# Patient Record
Sex: Female | Born: 1965 | Race: Black or African American | Hispanic: No | Smoking: Never smoker
Health system: Southern US, Community
[De-identification: ages and names within clinical notes are randomized; demographics above are authoritative.]

## PROBLEM LIST (undated history)

## (undated) DIAGNOSIS — I10 Essential (primary) hypertension: Secondary | ICD-10-CM

## (undated) DIAGNOSIS — G43909 Migraine, unspecified, not intractable, without status migrainosus: Secondary | ICD-10-CM

## (undated) DIAGNOSIS — R7303 Prediabetes: Secondary | ICD-10-CM

## (undated) HISTORY — PX: CHOLECYSTECTOMY: SHX55

## (undated) HISTORY — PX: CYST EXCISION: SHX5701

## (undated) HISTORY — PX: ABDOMINAL HYSTERECTOMY: SHX81

## (undated) HISTORY — PX: APPENDECTOMY: SHX54

---

## 1998-06-23 ENCOUNTER — Encounter: Payer: Self-pay | Admitting: Emergency Medicine

## 1998-06-23 ENCOUNTER — Emergency Department (HOSPITAL_COMMUNITY): Admission: EM | Admit: 1998-06-23 | Discharge: 1998-06-23 | Payer: Self-pay | Admitting: Emergency Medicine

## 1999-01-22 ENCOUNTER — Inpatient Hospital Stay (HOSPITAL_COMMUNITY): Admission: AD | Admit: 1999-01-22 | Discharge: 1999-01-22 | Payer: Self-pay | Admitting: Obstetrics & Gynecology

## 1999-04-15 ENCOUNTER — Encounter: Payer: Self-pay | Admitting: Obstetrics and Gynecology

## 1999-04-15 ENCOUNTER — Other Ambulatory Visit: Admission: RE | Admit: 1999-04-15 | Discharge: 1999-04-15 | Payer: Self-pay | Admitting: Obstetrics and Gynecology

## 1999-04-15 ENCOUNTER — Ambulatory Visit (HOSPITAL_COMMUNITY): Admission: RE | Admit: 1999-04-15 | Discharge: 1999-04-15 | Payer: Self-pay | Admitting: Obstetrics and Gynecology

## 1999-12-24 ENCOUNTER — Emergency Department (HOSPITAL_COMMUNITY): Admission: EM | Admit: 1999-12-24 | Discharge: 1999-12-24 | Payer: Self-pay | Admitting: Emergency Medicine

## 2000-01-21 ENCOUNTER — Encounter (INDEPENDENT_AMBULATORY_CARE_PROVIDER_SITE_OTHER): Payer: Self-pay | Admitting: Specialist

## 2000-01-21 ENCOUNTER — Ambulatory Visit (HOSPITAL_COMMUNITY): Admission: RE | Admit: 2000-01-21 | Discharge: 2000-01-21 | Payer: Self-pay | Admitting: Internal Medicine

## 2000-12-23 ENCOUNTER — Other Ambulatory Visit: Admission: RE | Admit: 2000-12-23 | Discharge: 2000-12-23 | Payer: Self-pay | Admitting: Obstetrics and Gynecology

## 2001-03-27 ENCOUNTER — Emergency Department (HOSPITAL_COMMUNITY): Admission: EM | Admit: 2001-03-27 | Discharge: 2001-03-27 | Payer: Self-pay | Admitting: Emergency Medicine

## 2001-08-25 ENCOUNTER — Emergency Department (HOSPITAL_COMMUNITY): Admission: EM | Admit: 2001-08-25 | Discharge: 2001-08-25 | Payer: Self-pay | Admitting: *Deleted

## 2001-11-07 ENCOUNTER — Encounter: Payer: Self-pay | Admitting: Internal Medicine

## 2001-11-07 ENCOUNTER — Encounter: Admission: RE | Admit: 2001-11-07 | Discharge: 2001-11-07 | Payer: Self-pay | Admitting: Internal Medicine

## 2001-11-09 ENCOUNTER — Emergency Department (HOSPITAL_COMMUNITY): Admission: EM | Admit: 2001-11-09 | Discharge: 2001-11-09 | Payer: Self-pay | Admitting: *Deleted

## 2001-11-24 ENCOUNTER — Emergency Department (HOSPITAL_COMMUNITY): Admission: EM | Admit: 2001-11-24 | Discharge: 2001-11-24 | Payer: Self-pay | Admitting: Emergency Medicine

## 2001-12-14 ENCOUNTER — Emergency Department (HOSPITAL_COMMUNITY): Admission: EM | Admit: 2001-12-14 | Discharge: 2001-12-14 | Payer: Self-pay | Admitting: Emergency Medicine

## 2001-12-14 ENCOUNTER — Encounter: Payer: Self-pay | Admitting: Emergency Medicine

## 2002-03-06 ENCOUNTER — Emergency Department (HOSPITAL_COMMUNITY): Admission: EM | Admit: 2002-03-06 | Discharge: 2002-03-06 | Payer: Self-pay | Admitting: Emergency Medicine

## 2002-04-07 ENCOUNTER — Emergency Department (HOSPITAL_COMMUNITY): Admission: EM | Admit: 2002-04-07 | Discharge: 2002-04-07 | Payer: Self-pay | Admitting: Emergency Medicine

## 2002-07-25 ENCOUNTER — Encounter: Payer: Self-pay | Admitting: Emergency Medicine

## 2002-07-25 ENCOUNTER — Emergency Department (HOSPITAL_COMMUNITY): Admission: EM | Admit: 2002-07-25 | Discharge: 2002-07-25 | Payer: Self-pay | Admitting: Emergency Medicine

## 2002-10-18 ENCOUNTER — Emergency Department (HOSPITAL_COMMUNITY): Admission: EM | Admit: 2002-10-18 | Discharge: 2002-10-18 | Payer: Self-pay

## 2002-11-27 ENCOUNTER — Emergency Department (HOSPITAL_COMMUNITY): Admission: EM | Admit: 2002-11-27 | Discharge: 2002-11-27 | Payer: Self-pay | Admitting: Emergency Medicine

## 2002-12-22 ENCOUNTER — Emergency Department (HOSPITAL_COMMUNITY): Admission: EM | Admit: 2002-12-22 | Discharge: 2002-12-22 | Payer: Self-pay | Admitting: Emergency Medicine

## 2003-03-26 ENCOUNTER — Emergency Department (HOSPITAL_COMMUNITY): Admission: EM | Admit: 2003-03-26 | Discharge: 2003-03-26 | Payer: Self-pay | Admitting: Emergency Medicine

## 2003-04-26 ENCOUNTER — Emergency Department (HOSPITAL_COMMUNITY): Admission: AD | Admit: 2003-04-26 | Discharge: 2003-04-26 | Payer: Self-pay | Admitting: Emergency Medicine

## 2003-06-18 ENCOUNTER — Emergency Department (HOSPITAL_COMMUNITY): Admission: AD | Admit: 2003-06-18 | Discharge: 2003-06-18 | Payer: Self-pay | Admitting: Family Medicine

## 2003-06-30 ENCOUNTER — Emergency Department (HOSPITAL_COMMUNITY): Admission: EM | Admit: 2003-06-30 | Discharge: 2003-06-30 | Payer: Self-pay | Admitting: Emergency Medicine

## 2003-07-08 ENCOUNTER — Encounter (INDEPENDENT_AMBULATORY_CARE_PROVIDER_SITE_OTHER): Payer: Self-pay | Admitting: Specialist

## 2003-07-08 ENCOUNTER — Ambulatory Visit (HOSPITAL_COMMUNITY): Admission: RE | Admit: 2003-07-08 | Discharge: 2003-07-08 | Payer: Self-pay | Admitting: *Deleted

## 2003-07-17 ENCOUNTER — Encounter: Admission: RE | Admit: 2003-07-17 | Discharge: 2003-07-17 | Payer: Self-pay | Admitting: *Deleted

## 2003-07-26 ENCOUNTER — Encounter: Admission: RE | Admit: 2003-07-26 | Discharge: 2003-07-26 | Payer: Self-pay | Admitting: *Deleted

## 2003-08-19 ENCOUNTER — Emergency Department (HOSPITAL_COMMUNITY): Admission: EM | Admit: 2003-08-19 | Discharge: 2003-08-19 | Payer: Self-pay | Admitting: Family Medicine

## 2003-09-15 ENCOUNTER — Emergency Department (HOSPITAL_COMMUNITY): Admission: EM | Admit: 2003-09-15 | Discharge: 2003-09-16 | Payer: Self-pay | Admitting: Emergency Medicine

## 2003-10-22 ENCOUNTER — Emergency Department (HOSPITAL_COMMUNITY): Admission: EM | Admit: 2003-10-22 | Discharge: 2003-10-22 | Payer: Self-pay | Admitting: Family Medicine

## 2003-10-30 ENCOUNTER — Emergency Department (HOSPITAL_COMMUNITY): Admission: EM | Admit: 2003-10-30 | Discharge: 2003-10-30 | Payer: Self-pay | Admitting: Emergency Medicine

## 2003-12-11 ENCOUNTER — Emergency Department (HOSPITAL_COMMUNITY): Admission: EM | Admit: 2003-12-11 | Discharge: 2003-12-11 | Payer: Self-pay | Admitting: Family Medicine

## 2004-02-07 ENCOUNTER — Emergency Department (HOSPITAL_COMMUNITY): Admission: EM | Admit: 2004-02-07 | Discharge: 2004-02-07 | Payer: Self-pay | Admitting: Family Medicine

## 2004-03-04 ENCOUNTER — Emergency Department (HOSPITAL_COMMUNITY): Admission: EM | Admit: 2004-03-04 | Discharge: 2004-03-04 | Payer: Self-pay | Admitting: Emergency Medicine

## 2004-03-23 ENCOUNTER — Emergency Department (HOSPITAL_COMMUNITY): Admission: EM | Admit: 2004-03-23 | Discharge: 2004-03-23 | Payer: Self-pay | Admitting: Emergency Medicine

## 2004-06-01 ENCOUNTER — Emergency Department (HOSPITAL_COMMUNITY): Admission: EM | Admit: 2004-06-01 | Discharge: 2004-06-01 | Payer: Self-pay | Admitting: Emergency Medicine

## 2004-09-11 ENCOUNTER — Emergency Department (HOSPITAL_COMMUNITY): Admission: EM | Admit: 2004-09-11 | Discharge: 2004-09-11 | Payer: Self-pay | Admitting: *Deleted

## 2004-10-31 ENCOUNTER — Emergency Department (HOSPITAL_COMMUNITY): Admission: EM | Admit: 2004-10-31 | Discharge: 2004-10-31 | Payer: Self-pay | Admitting: Family Medicine

## 2004-11-05 ENCOUNTER — Ambulatory Visit (HOSPITAL_COMMUNITY): Admission: RE | Admit: 2004-11-05 | Discharge: 2004-11-05 | Payer: Self-pay | Admitting: Obstetrics & Gynecology

## 2004-11-12 ENCOUNTER — Encounter (INDEPENDENT_AMBULATORY_CARE_PROVIDER_SITE_OTHER): Payer: Self-pay | Admitting: Specialist

## 2004-11-12 ENCOUNTER — Inpatient Hospital Stay (HOSPITAL_COMMUNITY): Admission: RE | Admit: 2004-11-12 | Discharge: 2004-11-14 | Payer: Self-pay | Admitting: Obstetrics & Gynecology

## 2005-08-13 ENCOUNTER — Emergency Department (HOSPITAL_COMMUNITY): Admission: EM | Admit: 2005-08-13 | Discharge: 2005-08-13 | Payer: Self-pay | Admitting: Family Medicine

## 2005-09-15 ENCOUNTER — Emergency Department (HOSPITAL_COMMUNITY): Admission: EM | Admit: 2005-09-15 | Discharge: 2005-09-15 | Payer: Self-pay | Admitting: Emergency Medicine

## 2006-02-01 ENCOUNTER — Encounter: Admission: RE | Admit: 2006-02-01 | Discharge: 2006-02-01 | Payer: Self-pay | Admitting: Gastroenterology

## 2006-07-31 ENCOUNTER — Emergency Department (HOSPITAL_COMMUNITY): Admission: EM | Admit: 2006-07-31 | Discharge: 2006-07-31 | Payer: Self-pay | Admitting: Emergency Medicine

## 2007-07-29 ENCOUNTER — Emergency Department (HOSPITAL_COMMUNITY): Admission: EM | Admit: 2007-07-29 | Discharge: 2007-07-29 | Payer: Self-pay | Admitting: Emergency Medicine

## 2007-07-30 ENCOUNTER — Emergency Department (HOSPITAL_COMMUNITY): Admission: EM | Admit: 2007-07-30 | Discharge: 2007-07-30 | Payer: Self-pay | Admitting: Family Medicine

## 2007-08-22 ENCOUNTER — Inpatient Hospital Stay (HOSPITAL_COMMUNITY): Admission: EM | Admit: 2007-08-22 | Discharge: 2007-08-28 | Payer: Self-pay | Admitting: Emergency Medicine

## 2007-09-12 ENCOUNTER — Emergency Department (HOSPITAL_COMMUNITY): Admission: EM | Admit: 2007-09-12 | Discharge: 2007-09-12 | Payer: Self-pay | Admitting: Emergency Medicine

## 2007-09-19 ENCOUNTER — Inpatient Hospital Stay (HOSPITAL_COMMUNITY): Admission: EM | Admit: 2007-09-19 | Discharge: 2007-09-20 | Payer: Self-pay | Admitting: Emergency Medicine

## 2007-09-19 ENCOUNTER — Ambulatory Visit: Payer: Self-pay | Admitting: Internal Medicine

## 2007-09-20 ENCOUNTER — Ambulatory Visit: Payer: Self-pay | Admitting: Psychiatry

## 2007-09-20 ENCOUNTER — Inpatient Hospital Stay (HOSPITAL_COMMUNITY): Admission: RE | Admit: 2007-09-20 | Discharge: 2007-09-23 | Payer: Self-pay | Admitting: *Deleted

## 2007-09-21 ENCOUNTER — Ambulatory Visit: Payer: Self-pay | Admitting: *Deleted

## 2007-09-27 ENCOUNTER — Ambulatory Visit (HOSPITAL_COMMUNITY): Payer: Self-pay | Admitting: Marriage and Family Therapist

## 2007-10-09 ENCOUNTER — Ambulatory Visit (HOSPITAL_COMMUNITY): Payer: Self-pay | Admitting: Marriage and Family Therapist

## 2007-12-04 ENCOUNTER — Emergency Department (HOSPITAL_COMMUNITY): Admission: EM | Admit: 2007-12-04 | Discharge: 2007-12-04 | Payer: Self-pay | Admitting: Family Medicine

## 2008-04-01 ENCOUNTER — Emergency Department (HOSPITAL_COMMUNITY): Admission: EM | Admit: 2008-04-01 | Discharge: 2008-04-01 | Payer: Self-pay | Admitting: Emergency Medicine

## 2008-05-10 ENCOUNTER — Emergency Department (HOSPITAL_COMMUNITY): Admission: EM | Admit: 2008-05-10 | Discharge: 2008-05-10 | Payer: Self-pay | Admitting: Emergency Medicine

## 2008-09-11 ENCOUNTER — Emergency Department (HOSPITAL_COMMUNITY): Admission: EM | Admit: 2008-09-11 | Discharge: 2008-09-11 | Payer: Self-pay | Admitting: Family Medicine

## 2009-08-14 ENCOUNTER — Emergency Department (HOSPITAL_COMMUNITY): Admission: EM | Admit: 2009-08-14 | Discharge: 2009-08-14 | Payer: Self-pay | Admitting: Family Medicine

## 2009-09-19 ENCOUNTER — Emergency Department (HOSPITAL_COMMUNITY): Admission: EM | Admit: 2009-09-19 | Discharge: 2009-09-19 | Payer: Self-pay | Admitting: Emergency Medicine

## 2010-07-11 ENCOUNTER — Encounter: Payer: Self-pay | Admitting: *Deleted

## 2010-07-12 ENCOUNTER — Encounter: Payer: Self-pay | Admitting: Emergency Medicine

## 2010-07-12 ENCOUNTER — Encounter: Payer: Self-pay | Admitting: Obstetrics & Gynecology

## 2010-09-09 LAB — POCT RAPID STREP A (OFFICE): Streptococcus, Group A Screen (Direct): NEGATIVE

## 2010-10-01 LAB — POCT URINALYSIS DIP (DEVICE)
Bilirubin Urine: NEGATIVE
Glucose, UA: NEGATIVE mg/dL
Ketones, ur: NEGATIVE mg/dL
Nitrite: NEGATIVE
Protein, ur: 100 mg/dL — AB
Specific Gravity, Urine: >=1.03 (ref 1.005–1.030)
Urobilinogen, UA: 0.2 mg/dL (ref 0.0–1.0)
pH: 5.5 (ref 5.0–8.0)

## 2010-10-01 LAB — POCT PREGNANCY, URINE: Preg Test, Ur: NEGATIVE

## 2010-11-03 NOTE — Discharge Summary (Signed)
NAMETAYLORANN, TKACH      ACCOUNT NO.:  [id number]   MEDICAL RECORD NO.:  [id number]          PATIENT TYPE:  INP   LOCATION:  4730                         FACILITY:  [facility]   PHYSICIAN:  Alvester Morin, M.D.  DATE OF BIRTH:  [birth year]   DATE OF ADMISSION:  09/19/2007  DATE OF DISCHARGE:  09/20/2007                               DISCHARGE SUMMARY   DISCHARGE DIAGNOSES:  1. Drug overdose.  2. Tachycardia.  3. Depression.  4. Bipolar disorder.  5. Hypokalemia.  6. Gastroesophageal reflux disease.   DISCHARGE MEDICATIONS:  1. The patient will be discharged on Protonix 40 mg p.o. daily.  2. Depakote p.o. b.i.d.  3. Lamictal 200 mg p.o. daily.  4. The rest of the medications will be decided after psychiatrist      evaluation, and pending per Behavioral Health.   DISPOSITION AND FOLLOW UP:  The patient will be evaluated by Dr. Electa Sniff  with Behavioral Health.  The patient will likely be admitted under  Behavioral Health's care secondary to second drug overdose within 2  weeks with suicidal ideation.   PROCEDURES PERFORMED:  The patient had a chest x-ray done on September 19, 2007 which showed no acute disease.   CONSULTATIONS:  Include psychiatry with Dr. Electa Sniff.   BRIEF H&P IS AS FOLLOWS/CHIEF COMPLAINT:  Overdose reportedly 5 Prozac.   HISTORY OF PRESENT ILLNESS:  The patient is a 45 year old African-  American woman with past medical history significant for depression and  history of questionable SI with attempt which occurred approximately 2  weeks ago when the patient took over 20 Prozac.  The patient also had a  past medical history significant for bipolar disorder and serotonin  syndrome which occurred 2 weeks' ago with her overdose at that time.  The patient is presenting to the ED after she told family members she  took 5 of her Prozac which were discontinued after last hospitalization.   The patient reports increased stress after losing her job, and says  that  she took the pills to get attention.  The patient complains of nausea,  vomiting, and the sensation that the pills are stuck in her throat.  The  patient does not have any other real complaints.  Of note, during last  reported overdose, she was seen by Dr. Electa Sniff at that time as well.  The patient's Prozac was discontinued at that time.  She was  discontinued on a  Librium taper as well as put on Cyproheptadine 2 mg  q.i.d.  The patient reportedly had found her Prozac pills after her  husband had hid them and took 5 on the day of admission.   FOR ALLERGIES PAST MEDICAL HISTORY MEDICATIONS SUBSTANCE HISTORY SOCIAL  HISTORY AND FAMILY HISTORY:  Please see hospital chart.   PHYSICAL EXAM:  VITAL SIGNS: Temperature equals 98.9, blood pressure  146/94, pulse equals 118, respiratory rate equals 12, O2 saturation is  99% room air.  GENERAL:  The patient has flat affect, NAD, positive tremor.  EYES:  PERLA, EOMI, left horizontal nystagmus is present, anicteric.  ENT: MMM, no erythema.  NECK: No carotid bruits, no lymphadenopathy, no thyroid nodules.  The  patient does appear to have increased thickness in her neck around the  thyroid however.  RESPIRATORY:  CTA bilaterally.  CARDIOVASCULAR: S1, S2, tachycardic, regular, no murmurs, rubs, or  gallops.  GASTROINTESTINAL:  Soft, nontender, positive bowel sounds, no guarding,  no rebound.  EXTREMITIES: No edema.  GENITOURINARY:  No CVA tenderness.  SKIN:  No jaundice, warm to touch, not clammy to touch.  LYMPH:  No lymphadenopathy.  MUSCULOSKELETAL:  The patient moves all fours.  NEURO:  The patient is alert and oriented x3, cranial nerves II-XII  grossly intact (left horizontal nystagmus), motor 5/5, sensation intact  to light touch, cerebellar intact, finger-to-nose, DTRs normal.  PSYCH:  Flat affect, depressed looking.   INITIAL LABS INCLUDE:  A sodium 139, potassium 4.0, chloride 104.  Bicarb of 25, BUN 9, creatinine 0.7, glucose  115.  Alcohol less than 5.  UDS was positive for benzodiazepines.  Acetaminophen less than 10,  salicylates less 4, valproic acid equals 55.2 and normal.  The patient  had a CT of the head which was negative for bleed or other intracranial  process.  Right maxillary sinus disease was noted.   Additional labs include the PT equals 13.1, INR 1.0, PTT at 25.  Serum  ketones were negative.  Total bilirubin 0.2, alkaline phosphatase 38,  AST 19, ALT 14, total protein 6.1, albumin 3.1, calcium 8.8.  Serum  amylase normal at 113.  Total CK 108 and normal.  Magnesium 2.0.  Serum  osmolality was also checked which was 294.  WBC 11.3, hemoglobin 11.1,  hematocrit 32.7, platelets 280.  TSH equals 3.134.  Methanol level was  also checked and was negative.  The patient also had a chest x-ray as  mentioned above.   HOSPITAL COURSE BY PROBLEM:  Problem #1:  Drug overdose.  Most likely  secondary to Prozac given her symptoms and her testimony.  However, her  UDS was positive for benzodiazepines so this cannot be ruled out.  The  question remains whether or not the medication that she was on could  cause a false positive for benzodiazepines.  The patient did receive  Ativan in the ED, but it was after her urine was collected for her UDS.  The patient was admitted to telemetry bed.  The patient was given IV  fluids, received charcoal in the emergency department.  Urine PCAs were  also checked and are pending at the time of discharge.  Serum  osmolality, ketones, CK, amylase, ethylene glycol, methanol, and serial  EKGs were also obtained.  Most importantly, psych consult has been  obtained, and Dr. Electa Sniff will see the patient today.  If the patient  for some reason has to stay in the hospital, an addendum will be made at  that time.  If Dr. Electa Sniff feels that she does not need to go to  [facility], then the patient will be discharged home today since  her medical problems are stable.   Problem  #2:  Tachycardia most likely secondary to #1 above.  A TSH was  checked and was in the normal range.  EKG was checked which showed some  mild QT prolongation as well as sinus tachycardia.  She also had some  nonspecific ST changes.   Problem #3:  Depression.  The patient's Lamictal and Depakote were  initially held secondary to the overdose.  These will be resumed prior  to discharge.  Further management per Dr. Barrett Shell recommendations.   Problem #4:  Bipolar disorder.  As mentioned above, Lamictal and  Depakote were held.  These will be continued prior to discharging to  [facility].  Further recommendations per Dr. Electa Sniff.   Problem #4:  GERD.  The patient reports a sensation in chest that feels  like something is stuck in it, and she has cough and some vague chest  pain, especially at night.  She will be initiated on Protonix to see if  reflux is the cause of her chest discomfort.  Patient has very low  probability for have an acute coronary syndrome at this time.  EKG  showed no acute changes as well.   DISCHARGE LABS ARE AS FOLLOWS:  Sodium 138, potassium 3.6, chloride 105,  bicarb 23, glucose 122, BUN 7, creatinine 0.81, total bilirubin 0.5,  alkaline phosphatase 41, AST 16, ALT 13, total protein 5.8, albumin 3.0,  calcium 8.7.  WBC equals 11.3, hemoglobin 11.1, hematocrit 32.7,  platelets 280.  Ethylene glycol negative, methanol negative, and TSH  normal at 3.134.   DISCHARGE VITALS INCLUDE:  Temperature 98.4, pulse 82, respiratory rate  20, blood pressure 117/79, O2 saturation 99% on room air.      Rufina Falco, M.D.  Electronically Signed      Alvester Morin, M.D.  Electronically Signed    JY/MEDQ  D:  09/20/2007  T:  09/20/2007  Job:  [id number]

## 2010-11-03 NOTE — Discharge Summary (Signed)
[name], [name]      ACCOUNT NO.:  [id number]   MEDICAL RECORD NO.:  [id number]          PATIENT TYPE:  INP   LOCATION:  5124                         FACILITY:  [facility]   PHYSICIAN:  Hillery Aldo, M.D.   DATE OF BIRTH:  1965-11-15   DATE OF ADMISSION:  08/22/2007  DATE OF DISCHARGE:  08/28/2007                               DISCHARGE SUMMARY   PRIMARY CARE PHYSICIAN:  Unassigned.   PSYCHIATRIST:  Dr. Tiajuana Amass.   DISCHARGE DIAGNOSES:  1. Serotonin syndrome.  2. Bipolar disorder.  3. Right maxillary sinus disease.  4. Weakness with falls secondary to number one.  5. Hypokalemia.   DISCHARGE MEDICATIONS:  1. Depakote 500 mg b.i.d.  2. Lamictal 200 mg b.i.d.  3. Librium 25 mg t.i.d. x3 days, then b.i.d. x3 days, then daily x3      days, then stop.  4. Cyproheptadine 2 mg q.i.d.   CONSULTATIONS:  Dr. Electa Sniff of Psychiatry.   BRIEF ADMISSION HISTORY OF PRESENT ILLNESS:  The patient is a 45-year-  old female with past medical history of bipolar disorder who has been on  multiple medications including Depakote, Lamictal, and Prozac.  She ran  out of the Lamictal and Depakote for approximately 4 days' time.  Concurrent with this, Dr. Tomasa Rand increased her dose of Prozac from  10 to 20 mg daily.  The patient took extra medications in an attempt to  catch up.  She essentially took extra Prozac although the exact amount  is unclear at this time.  She developed symptoms including extreme  tremulousness, diplopia, confusion, hallucinations, and subsequently  presented to the Emergency Department for evaluation.   PROCEDURES AND DIAGNOSTIC STUDIES:  1. CT scan of the head on August 22, 2007, showed no acute intracranial      abnormality.  2. MRI of the brain on August 22, 2007, showed no acute intracranial      abnormality, decreased marrow signal of the upper cervical spine,      minimal mucosal thickening versus focal polyp or mucous retention      cyst  posterior right maxillary sinus.   DISCHARGE LABORATORY VALUES:  Sodium was 140, potassium 3.5, chloride  107, bicarb 22, BUN 4, creatinine 0.82, glucose 124.  White blood cell  count was 7.6, hemoglobin 11.5, hematocrit 33.7, platelets 276,000.   HOSPITAL COURSE:  1. Serotonin syndrome:  The patient's history of present illness      certainly suggested serotonin syndrome as being the possible      etiologic cause of her symptoms.  She did have horizontal      nystagmus.  She was put on cyproheptadine, and a diagnostic workup      including CT and MRI scans was obtained which were negative for      acute intracranial findings.  The patient did not have any evidence      of rhabdomyolysis with normal CK values.  The urine drug screen was      similarly negative.  The Prozac was completely discontinued, and      with q.2 h. Cyproheptadine treatment, the patient's symptoms      gradually improved.  Dr.  Electa Sniff also saw the patient in      consultation and recommended a Librium taper.  At this time, we      will continue to taper her down slowly.  Her tremulousness has      markedly improved; however, the patient is still somewhat unsteady      on her feet.  Physical therapy did see her and recommended 24 hours      supervision with home health PT set up.  The patient's husband and      family will provide her with this care until she is steady.  She      should follow up with Dr. Tomasa Rand upon discharge.  2. Bipolar disorder:  The patient's mood has remained stable.      Question possible component of somatization versus malingering with      regard to her ongoing symptoms.  The patient reported diplopia even      with monocular testing suggesting that there was some element of      malingering.  The patient was continued on her Depakote and      Lamictal as previously ordered by Dr. Tomasa Rand.  3. Hypokalemia:  The patient was appropriately repleted.   DISPOSITION:  The patient is  medically stable and will be discharged  home.  We will set up home health physical therapy, and we have  recommended 24-hour supervision and not getting up without assistance.      Hillery Aldo, M.D.  Electronically Signed     CR/MEDQ  D:  08/28/2007  T:  08/29/2007  Job:  [id number]   cc:   Tiajuana Amass, M.D.

## 2010-11-03 NOTE — H&P (Signed)
[name], [name]      ACCOUNT NO.:  [id number]   MEDICAL RECORD NO.:  [id number]          PATIENT TYPE:  INP   LOCATION:  1823                         FACILITY:  [facility]   PHYSICIAN:  Lonia Blood, M.D.DATE OF BIRTH:  [birth year]   DATE OF ADMISSION:  08/21/2007  DATE OF DISCHARGE:                              HISTORY & PHYSICAL   PRIMARY CARE PHYSICIAN:  Unassigned.   PSYCHIATRIST:  Dr. Tomasa Rand.   CHIEF COMPLAINT:  Tremor, dizziness and diplopia.   PRESENT ILLNESS:  Ms. [name] is a pleasant 45 year old  female with a history of bipolar disorder/depression.  She has been  treated for such for quite some time now with Depakote and Lamictal.  Approximately 1-2 months ago Prozac was initiated in the patient at 10  mg daily.  The patient reports that approximately 2-3 days ago her  Prozac was increased from 10 to 20 mg daily.  At the same time Lamictal  increased 100 to 200 mg daily.  This evening at approximately 7 o'clock  at night the patient suffered the acute onset of severe tremulousness.  She reports that she felt very agitated.  She had very poor control of  muscle movements of her lower extremities and felt that her legs became  extremely jerky.  She also noted severe jerking of the eyes causing  significant diplopia and bringing about severe nausea.  The patient's  jerkiness of her lower extremities was so severe, she was actually  unable to walk.  She presented to the emergency room.  She was treated  with Ativan initially.  The patient's tremulousness has improved but her  diplopia and ocular jerking have not.  The patient denies chest pain.  There is no shortness of breath, fevers, chills, nausea, vomiting, or  abdominal pain.  There has been no change of bowel or bladder habits.  There is no focal neurologic deficit.  The patient denies using any  other medications or illicit drugs.  The patient has not been drinking  tonight.   REVIEW OF SYSTEMS:  A comprehensive review of systems is unremarkable  with the exception of positive elements noted in the history of present  illness above.  The patient was in her usual state of health until the  onset of her symptoms tonight.   PAST MEDICAL HISTORY:  1. Bipolar disorder/depression.  2. Status post hysterectomy in 2006 for dysfunctional uterine      bleeding.  3. Migraine headaches.  4. Status post cholecystectomy.  5. Tobacco abuse.   OUTPATIENT MEDICATIONS:  1. Depakote 500 mg b.i.d.  2. Lamictal recently increased from 100 mg daily to 200 mg daily.  3. Prozac recently increased from 10 mg daily to 20 mg daily.   ALLERGIES:  No known drug allergies.   FAMILY HISTORY:  Noncontributory secondary to age.   SOCIAL HISTORY:  Occasionally the patient partakes of alcohol but not to  excess.  She does not use illicit drugs.  She has four children.  She is  married.   DATA REVIEW:  BMET is unremarkable.  Depakote level is therapeutic at  69.  Urine drug screen is positive for benzodiazepines,  which have been  given in the ER.  CT scan of the head reveals no acute disease.   PHYSICAL EXAMINATION:  Temperature 99.0, blood pressure 157/86, heart  rate 125, respiratory rate 22, O2 saturation is 98% on room air.  GENERAL:  A well-developed, well-nourished female in no acute  respiratory distress.  HEENT:  Normocephalic, atraumatic.  Pupils are dilated bilaterally and  equal.  There is significant ocular clonus with a horizontal nystagmus.  Extraocular muscles are intact otherwise.  NECK:  No JVD.  LUNGS:  Clear to auscultation bilaterally without wheezes or rhonchi.  CARDIOVASCULAR:  Regular rate and rhythm, though tachycardic, without  gallop or rub.  ABDOMEN:  Nontender, nondistended, soft, bowel sounds present.  No  hepatosplenomegaly, rebound or ascites.  EXTREMITIES:  There is hyperreflexia across bilateral lower extremities.  There is inducible clonus at the  gastrocnemius and the quadriceps  bilaterally.  The patient displays 5/5 strength in her upper and lower  extremities.  She is alert and x4.   IMPRESSION AND PLAN:  1. Serotonin syndrome.  The patient's ocular clonus, lower extremity      clonus, hyperreflexia, dilated pupils, agitation and      tremulousness/ataxia are all characteristic of serotonin syndrome.      The patient is also tachycardic with a mild hypertension.  The      patient is somewhat hyperthermic with temperature 99.0.  The      patient appears to be suffering with a mild to moderate serotonin      syndrome.  Actually, she has improved significantly during her stay      in the emergency room.  Her ocular clonus, however, continues to be      a severe, bothersome issue.  I will admit the patient for      observation.  We do not currently have telemetry beds but given the      patient is improving already, I will not push for this.  We will      try [place] cyproheptadine as the patient is having significant      discomfort related to her ocular clonus.  We will also administer      Ativan to help her rest.  We will hydrate her gently.  We will hold      Prozac but we will continue the Depakote and Lamictal.  2. Tobacco abuse.  I have advised the patient of the multiple      deleterious effects of ongoing tobacco abuse and have encouraged      her to discontinue smoking at once.  3. Migraine headache, not currently active.      Lonia Blood, M.D.  Electronically Signed     JTM/MEDQ  D:  08/22/2007  T:  08/22/2007  Job:  [id number]   cc:   Dr. Tomasa Rand

## 2010-11-03 NOTE — Consult Note (Signed)
[name], [name]      ACCOUNT NO.:  [id number]   MEDICAL RECORD NO.:  [id number]          PATIENT TYPE:  INP   LOCATION:  5127                         FACILITY:  [facility]   PHYSICIAN:  Anselm Jungling, MD  DATE OF BIRTH:  Nov 27, 1965   DATE OF CONSULTATION:  08/24/2007  DATE OF DISCHARGE:                                 CONSULTATION   REFERRING PHYSICIAN:  Dr. Darnelle Catalan.   IDENTIFICATION AND REASON FOR REFERRAL:  The patient is a 44 year old  married African American female, currently under the care of Dr. Darnelle Catalan  here at [facility], referred for psychiatric evaluation due to  apparent serotonin syndrome.   HISTORY OF PRESENTING PROBLEMS:  The following information is obtained  from the medical record and from the patient herself, who appears to be  reasonably good historian.   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 Tomasa Rand, a local psychiatrist.  She  carries a diagnosis of bipolar disorder and has been on an outpatient  regimen of medications that include Depakote, Lamictal, and Prozac.  The  patient states that she recently ran out of both Lamictal and Depakote  for about 4 days.  Concurrent with this, Dr. Tomasa Rand had increased to  Prozac dose from 10 mg to 20 mg daily.  The patient states that when she  did get refills on her prescriptions for Lamictal and Depakote, that she  attempted to catch up by taking an extra medication.  It is not clear  whether she also tried to catch up by taking extra Prozac.  Nonetheless, she developed symptoms consistent with serotonin syndrome,  including extreme tremulousness, diplopia, confusion, hallucinations,  and was subsequently admitted to the hospital.   Her workup thus far has revealed normal CT and MRI scans of the brain.  Negative urine drug screen.  Her Depakote level was measured at 68.6.  She has not shown any sign of any renal damage, and there has  not been  any laboratory evidence suggestive of rhabdomyolysis.   Current treatment has included a continuation of Depakote and Lamictal,  but a discontinuation of Prozac, with p.r.n. Ativan.  She has also been  given cyproheptadine, which is a fairly standard treatment for serotonin  syndrome.   The patient indicates to me that she does recall hallucinating and was  very confused initially, but she feels that her thinking and perceptions  are clear and normal at this time.  However, she can still continues  with considerable tremor, bilaterally, especially in the upper  extremities.  She indicates that currently she is sleeping and eating  well.  She still feels somewhat weak.  She states that her vision is  returning to normal at this time.   MENTAL STATUS AND OBSERVATIONS:  The patient is a well-nourished,  normally-developed adult female lying in bed, chatting with her  roommate.  She is alert, fully oriented, pleasant and friendly.  There  are no signs or symptoms of thought disorder or psychosis whatsoever,  nor are there any signs of delirium or confusion.  Her upper extremity  tremor is  a quite notable, and it almost precludes her being able to  take a sip of fluid without spelling it.  She is able to discuss her  history of bipolar disorder and medication treatment quite clearly.  As  referenced above, she is able to detail what she recalls about running  out of certain medications and then trying to catch up on them.  Her  mood appears to be neutral, with affect appropriate to the situation.  There are no signs or symptoms of active or acute bipolarity,  depression, mania or hypomania at this time.   IMPRESSION:  The patient does appear to have signs and symptoms  consistent with a serotonin syndrome, and she provides history that  would give an explanatory basis for it.  Serotonin syndrome is something  that can persist for several days, and some of the symptoms such as   tremor can linger.  However, the patient appears to be making good  recovery.  She is most likely beyond the point of being at risk for any  of the more severe sequelae of serotonin syndrome and mental confusion  and delirium.   At this time, I agree with her current management completely.  I might  suggest utilization of Librium over Ativan; since it has a longer half-  life, it may be more satisfactory and be helpful in addressing some of  the patient's tremulousness.  We do want to avoid beta blockers entirely  in this situation.   I would suggest perhaps Librium 25 mg q.i.d. to try to get her  tremulousness under good control, and then immediately began a gradual  taper, going down to 25 t.i.d. the following day, then 25 b.i.d., and  then 10 t.i.d. and further downward to discontinuation.  I feel it is  appropriate to continue her Lamictal and Depakote at the current doses.  She will need a followup appointment with Dr. Tomasa Rand as soon as  possible.   DIAGNOSTIC IMPRESSION:   AXIS I:  Bipolar affective disorder, currently euthymic, status post  delirium, secondary to serotonin syndrome.   AXIS II:  Deferred.   AXIS III:  Status post serotonin syndrome, resolving.   AXIS IV:  Stressors, severe.   AXIS V:  Global assessment of functioning 80.   RECOMMENDATIONS:  As above.  Please do not hesitate to contact me if I  can be of any further assistance.  Cell number [phone].      Anselm Jungling, MD  Electronically Signed     SPB/MEDQ  D:  08/24/2007  T:  08/24/2007  Job:  [id number]

## 2010-11-03 NOTE — Consult Note (Signed)
[name], [name]      ACCOUNT NO.:  [id number]   MEDICAL RECORD NO.:  [id number]          PATIENT TYPE:  INP   LOCATION:  4730                         FACILITY:  [facility]   PHYSICIAN:  Anselm Jungling, MD  DATE OF BIRTH:  05-31-1966   DATE OF CONSULTATION:  09/20/2007  DATE OF DISCHARGE:                                 CONSULTATION   REFERRING PHYSICIAN:   IDENTIFYING DATA AND REASON FOR ADMISSION:  This is my second contact  with [name], a 45 year old married African American female who was  admitted in the aftermath of a suicide gesture by overdose.   HISTORY OF THE PRESENTING PROBLEMS:  The patient sees Dr. Tiajuana Amass, a local psychiatrist, for a diagnosis of bipolar disorder  and depression.  She has been on a regimen of Lamictal, Depakote, and  Prozac.  She has been having marital problems.  She states that she took  this overdose for attention.  She indicates that she did not really  want to harm herself or die.   The patient has had some other recent admissions here, the last one  being one in which there was concern about possible serotonin syndrome.  However, this is her first [facility] admission for anything related to  a suicide attempt or gesture.   MENTAL STATUS AND OBSERVATIONS:  The patient is a well-nourished,  normally-developed, adult female.  She is sitting on her bed eating  lunch.  She is alert, awake, and fully oriented.  She remembers me from  before.  She is pleasant and cooperative.  She does appear to be  depressed with sad affect.  She discusses her suicide gesture with  regret, but indicates that she did it because she wanted attention  from her husband.   She indicates that she continues to be followed by Dr. Tomasa Rand; and  has continued, for the most part, quite compliant with her medication  regimen.   Her thoughts and speech are normally organized.  There is nothing to  suggest any underlying form of thought disorder,  cognitive, or memory  impairment.  She makes no delusional statements.  She denies auditory  and visual hallucinations.  She denies any further intention to harm  herself.   I discussed with the patient the possibility of coming to the inpatient  psychiatric service for further evaluation, and the possibility of  family intervention; and she indicates that she is agreeable to this.   The patient and I agree that this is necessary because she has poor  understanding of the emotions and unconscious motivations that led to  her suicide gesture, which could have been quite dangerous.   DIAGNOSTIC IMPRESSION:  AXIS I:  Bipolar disorder, not otherwise  specified, currently depressed without psychotic features.  AXIS II:  Deferred.  AXIS III:  Status post overdose.  AXIS IV:  Stressors severe.  AXIS V:  GAF 40.   RECOMMENDATIONS:  As above.  I will contact our behavioral health  service intake and assessment team.  Social services here, at [facility], can help coordinate the transfer once the patient is medically  cleared.  Anselm Jungling, MD  Electronically Signed     SPB/MEDQ  D:  09/20/2007  T:  09/20/2007  Job:  [id number]

## 2010-11-06 NOTE — Discharge Summary (Signed)
[name], CAPUTI NO.:  [id number]   MEDICAL RECORD NO.:  [id number]          PATIENT TYPE:  IPS   LOCATION:  0307                          FACILITY:  [facility]   PHYSICIAN:  Jasmine Pang, M.D. DATE OF BIRTH:  [birth year]   DATE OF ADMISSION:  09/20/2007  DATE OF DISCHARGE:  09/23/2007                               DISCHARGE SUMMARY   IDENTIFICATION:  This is a 45 year old married African American female  from [place], [place].  She was admitted on September 20, 2007.   HISTORY OF PRESENT ILLNESS:  The patient stated she had suicidal  ideation.  She admits to a drug overdose on September 19, 2007.  She has a  history of bipolar disorder since her overdose.  The patient has been  inpatient at the medical psychiatric unit.  Stressors include marital  problems.  She denies auditory hallucinations or visual hallucinations  or homicidal ideation.  She was evaluated by Dr. Electa Sniff on consult  liaison, who recommended that she have further evaluation at [facility].  Dr. Electa Sniff felt she has poor understanding of the  emotions and unconscious motivation that led to her suicide posture.   PAST PSYCHIATRIC HISTORY:  This is the first [facility] admission for the  patient.  The patient is seeing Dr. Tomasa Rand in outpatient since  October 2009.  She has had an OD in the past.  She has been on Prozac in  the past.   FAMILY HISTORY:  Her mother was depressed.   SUBSTANCE ABUSE HISTORY:  No alcohol or drug use.   MEDICAL PROBLEMS:  No known acute medical problems except headaches.   MEDICATIONS:  Depakote and Lamictal.   PHYSICAL FINDINGS:  There were no acute physical medical problems noted.   HOSPITAL COURSE:  Upon admission, the patient was continued on Depakote  ER 500 mg p.o. b.i.d., Lamictal 200 mg p.o. daily, and Protonix 40 mg  p.o. daily.  She was also placed on Librium 25 mg p.o. q.6 h. p.r.n.  withdrawal.  In individual sessions with me, the patient  was reserved,  but cooperative.  She was somewhat resistant to going to unit  therapeutic groups and activities, but did participate.  As  hospitalization progressed, she states she was fed up with everything.  She feels everyone relies on her (husband and sisters).  She is not in  therapy.  She states she felt she needed more attention from her  husband.  A family session was scheduled with him.  As hospitalization  progressed, she became less depressed and less anxious.  She was  anticipating discharge after the family session.  On September 23, 2007,  mental status had improved markedly from admission status.  Mood was  euthymic.  Her affect was consistent with mood.  It was no suicidal or  homicidal ideation.  No thoughts of self-injurious behavior.  No  auditory or visual hallucinations.  No paranoia or delusions.  Thoughts  were logical and goal-directed.  Thought content, no predominant theme.  Cognitive was grossly back to baseline and it was felt the patient was  safe for discharge.   DISCHARGE DIAGNOSES:  Axis I:  Mood disorder not otherwise specified.  Axis II:  None.  Axis III:  Gastroesophageal reflux disease.  Axis IV:  Moderate (problems with primary support group and burden of  psychiatric illness).  Axis V:  Global assessment of functioning was 50 upon discharge.  GAF  was 35 upon admission.  GAF highest past year was 60-65.   DISCHARGE PLAN:  There was no specific activity level or dietary  restrictions.   POSTHOSPITAL CARE PLANS:  The patient will continue to see Dr.  Tomasa Rand and appointment is on September 27, 2007, at 2:30 p.m.  She will  also see Cleophas Dunker our counselor for followup counseling.   DISCHARGE MEDICATIONS:  Depakote 500 mg twice a day, Lamictal 200 mg  daily, Protonix 40 mg EC once daily.      Jasmine Pang, M.D.  Electronically Signed     BHS/MEDQ  D:  10/25/2007  T:  10/26/2007  Job:  [id number]

## 2010-11-06 NOTE — Discharge Summary (Signed)
[name], [name]             ACCOUNT NO.:  [id number]   MEDICAL RECORD NO.:  [id number]          PATIENT TYPE:  INP   LOCATION:  9315                          FACILITY:  [facility]   PHYSICIAN:  Charles A. Clearance Coots, M.D.DATE OF BIRTH:  1965/12/16   DATE OF ADMISSION:  11/12/2004  DATE OF DISCHARGE:  11/14/2004                                 DISCHARGE SUMMARY   ADMITTING DIAGNOSES:  1.  Dysmenorrhea.  2.  Irregular menstrual cycles.   DISCHARGE DIAGNOSES:  1.  Dysmenorrhea.  2.  Irregular menstrual cycles.  3.  Status post total abdominal hysterectomy.  4.  Discharged home in good condition.   REASON FOR ADMISSION:  A 45 year old black female with history of abnormal  uterine bleeding.  The patient gives a history of 2 years of irregular  cycles that are prolonged, heavy, and painful.  She was previously evaluated  by Dr. Cherly Hensen and was diagnosed with uterine fibroids.  She was treated at  that time medically with a combination of oral contraceptives with some  improvement in the menstrual flow.  Recent pelvic ultrasound was remarkable  for possible adenomyosis.  She reports increasing abdominal girth.  She  denies any change in her bowel or bladder habits.   PAST MEDICAL HISTORY:   SURGERY:  1.  Cholecystectomy.  2.  Benign breast biopsy.   ILLNESSES:  Migraine headaches.   MEDICATIONS:  Premarin and Provera.   ALLERGIES:  No known drug allergies.   SOCIAL HISTORY:  Single, employed as a Animal nutritionist.  No significant smoking history.  Negative history of  alcohol or recreational drug use.   FAMILY HISTORY:  Remarkable for hypertension, breast cancer, chronic  alcoholism.   PHYSICAL EXAMINATION:  GENERAL:  Well-nourished, slim, black female in no  acute distress.  VITAL SIGNS:  Temperature 98.7, pulse 126, blood pressure 133/73, weight 131  pounds.  LUNGS:  Clear to auscultation bilaterally.  HEART:  Regular rate and rhythm.  ABDOMEN:  Nontender.  PELVIC EXAM:  Normal external female genitalia.  Vaginal mucosa normal.  Cervix clear without lesions.  Bimanual exam reveals the uterus to be  moderately enlarged, anteverted.  There are no adnexal masses or tenderness  appreciated.   ADMITTING LABORATORY VALUES:  Hemoglobin 11.6, hematocrit 35.6, white blood  cell count 8700, platelets 495,000.  Urinalysis within normal limits.  Electrolytes within normal limits.  Urine pregnancy test is negative.   HOSPITAL COURSE:  The patient underwent a total abdominal hysterectomy on  Nov 12, 2004.  There were no intraoperative complications.  Postoperative  course was uncomplicated.  The patient was discharged home on postop day 2  in good condition.   DISCHARGE DISPOSITION:   MEDICATIONS:  Tylox and ibuprofen are prescribed for pain.   Routine written instructions were given for discharge after hysterectomy.  The patient is to call the office for a follow up appointment in 2 weeks.      CAH/MEDQ  D:  11/14/2004  T:  11/14/2004  Job:  [id number]

## 2010-11-06 NOTE — Op Note (Signed)
NAME:  [name], [name]                       ACCOUNT NO.:  [id number]   MEDICAL RECORD NO.:  [id number]                   PATIENT TYPE:  AMB   LOCATION:  ENDO                                 FACILITY:  [facility]   PHYSICIAN:  Georgiana Spinner, M.D.                 DATE OF BIRTH:  [birth year]   DATE OF PROCEDURE:  07/08/2003  DATE OF DISCHARGE:                                 OPERATIVE REPORT   PROCEDURE:  Colonoscopy.   INDICATIONS:  Rectal bleeding, history of colitis.   ANESTHESIA:  Demerol 80 mg, Versed 7 mg.   DESCRIPTION OF PROCEDURE:  With the patient mildly sedated in the left  lateral decubitus position, the Olympus videoscopic colonoscope was inserted  in the rectum, passed under direct vision to the cecum, identified by  ileocecal valve and appendiceal orifice, both of which were photographed.  We entered into the terminal ileum, which appeared normal, was photographed.  From this point the endoscope was slowly withdrawn, taking circumferential  views of the terminal ileum and colonic mucosa, stopping to take biopsies  first from the terminal ileum, subsequently from the cecum, and then  subsequently from the descending colon and rectum.  The endoscope was placed  in the retroflexed view in the rectum, a photograph taken, and the endoscope  was straightened and withdrawn.  The patient's vital signs and pulse  oximetry remained stable.  The patient tolerated the procedure well without  apparent complications.   FINDINGS:  1. Some granularity at the base of the cecum, which may have been normal     variant.  This was photographed and multiple biopsies taken.  Biopsies     were taken also of the terminal ileum.  2. Some mild loss of the normal vascular pattern seen in the descending     colon, biopsied.  3. Certainly granular changes of proctitis involving the most distal few     centimeters in the rectum.   PLAN:  Await biopsy report.  The patient will call me for the results  and  follow up with me as an outpatient.                                               Georgiana Spinner, M.D.    GMO/MEDQ  D:  07/08/2003  T:  07/08/2003  Job:  [id number]   cc:   Renaye Rakers, M.D.  [phone] [address]  [place]  [place] [address]  Fax: [phone]

## 2010-11-06 NOTE — H&P (Signed)
[name], [name]             ACCOUNT NO.:  [id number]   MEDICAL RECORD NO.:  [id number]          PATIENT TYPE:  INP   LOCATION:  NA                            [facility]  [facility]   PHYSICIAN:  Roseanna Rainbow, M.D.DATE OF BIRTH:  Aug 06, 1965   DATE OF ADMISSION:  DATE OF DISCHARGE:                                HISTORY & PHYSICAL   CHIEF COMPLAINT:  The patient is a 45 year old with abnormal uterine  bleeding who presents for a total abdominal hysterectomy.   HISTORY OF PRESENT ILLNESS:  The patient gives a two-year history of  irregular cycles that are prolonged, heavy, and painful.  She was previously  evaluated by Dr. Cherly Hensen and was diagnosed with uterine fibroids.  She was  treated at that time medically with a combination oral contraceptive with  some improvement in the menstrual flow.  A recent pelvic ultrasound was  remarkable for a possible adenomyosis.  She reports increasing abdominal  girth.  She denies any change in her bowel or bladder habits.   ALLERGIES:  No known drug allergies.   MEDICATIONS:  Premarin, Provera.   PAST MEDICAL HISTORY:  Migraine headaches.   PAST SURGICAL HISTORY:  1.  Cholecystectomy.  2.  Benign breast biopsy.   SOCIAL HISTORY:  She is single.  She is employed as a Teaching laboratory technician.  She has no significant smoking history.  She does not give any significant history of alcohol usage.  She denies  illicit drug use.   FAMILY HISTORY:  Remarkable for hypertension, breast cancer, chronic  alcoholism.   PAST GYNECOLOGIC HISTORY:  1.  Menstrual history:  Menarche onset at 45 years old.  Menstrual periods      are described as regular.  She has had moderate dysmenorrhea, duration      of three days, the flow is considered normal.  This was prior to the two-      year history of abnormal bleeding.  2.  She is sexually active, heterosexual.  3.  Last Pap smear performed on May 16.  4.  She is status  post a tubal ligation.  5.  She is status post laparoscopy with lysis of adhesions by Dr. Stefano Gaul.  6.  She has had a remote unilateral salpingo-oophorectomy, performed by Dr.      Celedonio Miyamoto.   PAST OBSTETRICAL HISTORY:  1.  The patient has been pregnant six times.  2.  She has four living children.  3.  There is a history of two miscarriages or abortions.   REVIEW OF SYSTEMS:  GU:  No complaints of genital problems, no frequency,  urgency, dysuria, or hematuria.  Please see the history of present illness.   PHYSICAL EXAMINATION:  VITAL SIGNS:  Temperature 98.7, pulse 126, blood  pressure 133/73, weight 131 pounds.  GENERAL/CONSTITUTIONAL:  [place] female, appears stated age.  LUNGS:  Clear to auscultation bilaterally.  HEART:  Regular rhythm.  ABDOMEN:  Nontender.  Masses noted in the suprapubic area.  PELVIC:  Normal EG/BUS.  On speculum exam, the vaginal mucosa is clear  without lesions, no discharge. The cervix is  clear, firm, and closed.  No  visible lesions.  No abnormal discharge.  On bimanual exam, the uterus is  moderately enlarged.  The position is anteverted.  The shape is irregular,  approximately 12 to 14 weeks aggregate size.  Adnexa:  No masses,  organomegaly, or local guarding.   ASSESSMENT:  1.  Irregular menstrual cycle.  2.  Dysmenorrhea.   PLAN:  The planned procedure is a total abdominal hysterectomy.  Discussed  risks, benefits, alternatives of surgery at length.      LAJ/MEDQ  D:  11/11/2004  T:  11/11/2004  Job:  [id number]

## 2010-11-06 NOTE — Op Note (Signed)
NAMEGIADA, [name]             ACCOUNT NO.:  [id number]   MEDICAL RECORD NO.:  [id number]          PATIENT TYPE:  INP   LOCATION:  9315                          [facility]ILITY[facility]   PHYSICIAN:  Roseanna Rainbow, M.D.DATE OF BIRTH:  [birth year]   DATE OF PROCEDURE:  DATE OF DISCHARGE:                                 OPERATIVE REPORT   PREOPERATIVE DIAGNOSIS:  Menorrhagia and dysmenorrhea, unresponsive to  medical therapy.   POSTOPERATIVE DIAGNOSIS:  Menorrhagia and dysmenorrhea, unresponsive to  medical therapy.   PROCEDURE:  1. Total abdominal hysterectomy.  2. Scar revision.     ASSISTANT:  Dr. Clearance Coots.   ANESTHESIA:  General endotracheal.   COMPLICATIONS:  None.   ESTIMATED BLOOD LOSS:  100 cc.   FLUIDS AND URINE OUTPUT:  As per anesthesiology.   OPERATIVE FINDINGS:  An 8 x 4 cm uterus.  The right ovary and tube were  absent.  The left adnexa was normal.  There were adhesions involving the  left adnexal region and anterior cul-de-sac.  There were also omental  adhesions involving the parietal peritoneum.   PROCEDURE:  The risks, benefits, indications, and alternatives of the  procedure were reviewed with the patient and informed consent had been  obtained.  The patient was taken to the operating room with an IV running.  The patient was placed in the dorsal lithotomy position, given general  anesthesia, and prepped and draped in the usual sterile fashion.  A midline  incision was then made through the previous scar after the scar had been  excised.  This was carried down to the fascia.  The fascia was then incised  along the length of the incision.  The parietal peritoneum was grasped  between two pickups, elevated, and entered sharply with a scalpel.  The  adhesions involving the omentum and parietal peritoneum were divided.  The  pelvis was examined with the findings noted above.  An O'Connor-O'Sullivan  retractor was placed into the incision and the bowel  packed away with  moistened laparotomy sponges.  Two long Kelly clamps were placed on the  cornu and used for retraction.  The round ligaments on both sides were  clamped, transected, and suture ligated with 0 Vicryl.  The anterior leaf of  the broad ligament was incised along the bladder reflection to the midline  from both sides.  The bladder was dissected off of the lower uterine segment  and cervix.  The utero-ovarian ligament and fallopian tube on the left side  were doubly clamped, transected, and both free ligatures and suture  ligatures were placed with 0 Vicryl.  Hemostasis was noted.  The uterine  arteries were skeletonized bilaterally, clamped with parametrial clamps,  transected, and suture ligated with 0 Vicryl.  Again, hemostasis was  assured.  The uterosacral ligaments were clamped on both sides, transected,  and suture ligated in a similar fashion.  The cervix and uterus were then  amputated.  The remainder of the vaginal cuff was closed with a figure-of-8  suture of 0 Vicryl.  Hemostasis was assured.  The pelvis was irrigated  copiously with warm  normal saline.  At this point, the ovarian right adnexal  pedicle was plicated to the peritoneum of the pelvic side wall.  All  laparotomy sponges and instruments were removed from the abdomen.  The  fascia and parietal peritoneum were closed with a running suture of 0 PDS.  The skin was closed with staples.  Sponge, laparotomy pad, needle, and  instrument counts were correct x2.  The patient was taken to the PACU awake  and in stable condition.      LAJ/MEDQ  D:  11/12/2004  T:  11/12/2004  Job:  [id number]

## 2010-11-06 NOTE — Procedures (Signed)
[facility]  Patient:    [name]                     MRN: [id number] Adm. Date:  [id number] Attending:  Mervin Hack CC:         Robert Bellow, M.D.   Procedure Report  PROCEDURE:  Colonoscopy.  ENDOSCOPIST:  Hedwig Morton. Juanda Chance, M.D.  INDICATION:  This 45 year old black female has presented to Dr. Perrin Maltese with intermittent rectal bleeding for the past several months.  She apparently has had similar episodes in the past.  On anoscopy by Dr. Perrin Maltese, as well as by myself, there was a friable mucosa in the rectum suggestive of ulcerative colitis.  She is now undergoing colonoscopy to confirm the diagnosis as well as to find out the extent of her disease.  She has been on ______ as well as mesalamine and prednisone for the past several days.  ENDOSCOPE:  Olympus single-channel video endoscope.  SEDATION:  Versed 10 mg IV, Demerol 100 mg IV.  FINDINGS:  Olympus single-channel video endoscope was passed directly into the rectum to the sigmoid colon.  Patient was monitored by pulse oximetry.  Her oxygen saturations were normal.  Anal canal was friable with exudate and friable mucosa through the rectal ampulla up to 5 cm from the rectum.  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  Multiple biopsies were taken from this area.  There were no aphthous ulcers or pseudopolyps.  Mild inflammatory changes took place between 5 to 20 cm from the anal canal.  Biopsies were also taken from this area.  There were no diverticula.  Mucosa above 20 cm appeared essentially normal with minimal hyperemia in the descending colon.  The splenic flexure, transverse colon and hepatic flexure were unremarkable, all the way to the cecum.  Liquid stool was coating the mucosa in the cecal pouch. Multiple biopsies were taken from the cecal pouch and the right colon.  A second specimen was taken from the transverse and left colon and the third specimen from the anal  canal and rectosigmoid.  Patient tolerated procedure well.  IMPRESSION:  Acute colitis involving rectosigmoid colon from 0 to 20 cm, consistent with ulcerative proctitis, status post biopsies.  PLAN 1. Patient is to follow the prednisone regimen, staying on 30 mg a day for    another week, and then slowly decreasing by 5 mg every week.  She is to see    [place] in eight weeks. 2. Kanasa suppositories q.h.s. 3. Continue mesalamine 1.2 g three times a day until she finishes up and then    we will continue only on topical mesalamine. 4. Office visit in eight weeks. DD:  01/21/00 TD:  01/21/00 Job: [id number]

## 2011-03-15 LAB — CK TOTAL AND CKMB (NOT AT ARMC)
CK, MB: 0.5
Relative Index: INVALID
Total CK: 83

## 2011-03-15 LAB — RAPID URINE DRUG SCREEN, HOSP PERFORMED
Amphetamines: NOT DETECTED
Barbiturates: NOT DETECTED
Benzodiazepines: POSITIVE — AB
Cocaine: NOT DETECTED
Cocaine: NOT DETECTED
Opiates: NOT DETECTED
Tetrahydrocannabinol: NOT DETECTED
Tetrahydrocannabinol: NOT DETECTED

## 2011-03-15 LAB — DIFFERENTIAL
Basophils Relative: 1
Eosinophils Absolute: 0.2
Eosinophils Relative: 2
Lymphs Abs: 2.8
Monocytes Absolute: 0.8
Monocytes Relative: 8
Neutrophils Relative %: 62

## 2011-03-15 LAB — POCT I-STAT, CHEM 8
Creatinine, Ser: 0.7
HCT: 40
Hemoglobin: 13.6
Sodium: 139
TCO2: 25

## 2011-03-15 LAB — COMPREHENSIVE METABOLIC PANEL
AST: 16
Albumin: 2.8 — ABNORMAL LOW
Alkaline Phosphatase: 37 — ABNORMAL LOW
Alkaline Phosphatase: 38 — ABNORMAL LOW
BUN: 6
BUN: 5 — ABNORMAL LOW
CO2: 26
Chloride: 110
Chloride: 109
Creatinine, Ser: 0.98
GFR calc non Af Amer: >60
Glucose, Bld: 145 — ABNORMAL HIGH
Potassium: 3.8
Potassium: 3.3 — ABNORMAL LOW
Total Bilirubin: 0.5
Total Bilirubin: 0.2 — ABNORMAL LOW
Total Protein: 6.1

## 2011-03-15 LAB — TRICYCLIC ANTIDEPRESSANT EVALUATION

## 2011-03-15 LAB — BASIC METABOLIC PANEL
BUN: 3 — ABNORMAL LOW
CO2: 25
Calcium: 8.7
Chloride: 107
Chloride: 102
Creatinine, Ser: 0.92
GFR calc Af Amer: >60
GFR calc non Af Amer: >60
GFR calc non Af Amer: >60
Glucose, Bld: 92
Potassium: 3.1 — ABNORMAL LOW
Potassium: 3.5
Potassium: 3.5
Sodium: 140
Sodium: 140

## 2011-03-15 LAB — I-STAT 8, (EC8 V) (CONVERTED LAB)
Acid-Base Excess: 1
BUN: 7
Bicarbonate: 26 — ABNORMAL HIGH
Chloride: 102
Glucose, Bld: 111 — ABNORMAL HIGH
HCT: 43
Hemoglobin: 14.6
Operator id: 257131
Potassium: 3.4 — ABNORMAL LOW
Sodium: 136
TCO2: 27
pCO2, Ven: 40.9 — ABNORMAL LOW
pH, Ven: 7.412 — ABNORMAL HIGH

## 2011-03-15 LAB — VALPROIC ACID LEVEL: Valproic Acid Lvl: 68.6

## 2011-03-15 LAB — POCT I-STAT 3, ART BLOOD GAS (G3+)
Bicarbonate: 24
Operator id: 280981

## 2011-03-15 LAB — ALCOHOL, METHYL (METHANOL), BLOOD

## 2011-03-15 LAB — POCT I-STAT CREATININE
Creatinine, Ser: 1
Operator id: 257131

## 2011-03-15 LAB — ETHANOL: Alcohol, Ethyl (B): <5

## 2011-03-15 LAB — SALICYLATE LEVEL: Salicylate Lvl: <4

## 2011-03-15 LAB — CBC
HCT: 38.9
MCHC: 33.9
MCV: 90.2
Platelets: 276
RBC: 4.31
RDW: 13.7
WBC: 7.6

## 2011-03-15 LAB — SEDIMENTATION RATE: Sed Rate: 11

## 2011-03-15 LAB — ACETAMINOPHEN LEVEL: Acetaminophen (Tylenol), Serum: <10 — ABNORMAL LOW

## 2011-03-15 LAB — ALDOLASE: Aldolase: 8.1 U/L (ref <=8.1)

## 2011-03-15 LAB — ETHYLENE GLYCOL: Ethylene Glycol Lvl: NEGATIVE

## 2011-03-15 LAB — AMYLASE: Amylase: 113

## 2011-03-16 LAB — COMPREHENSIVE METABOLIC PANEL WITH GFR
ALT: 13
AST: 16
Albumin: 3 — ABNORMAL LOW
Alkaline Phosphatase: 41
BUN: 7
CO2: 23
Calcium: 8.7
Chloride: 105
Creatinine, Ser: 0.81
GFR calc non Af Amer: >60
Glucose, Bld: 122 — ABNORMAL HIGH
Potassium: 3.6
Sodium: 138
Total Bilirubin: 0.5
Total Protein: 5.8 — ABNORMAL LOW

## 2011-03-16 LAB — CBC
HCT: 32.7 — ABNORMAL LOW
MCHC: 34
MCV: 91.2
Platelets: 280
RDW: 14.4
WBC: 11.3 — ABNORMAL HIGH

## 2011-03-16 LAB — APTT: aPTT: 26

## 2011-03-16 LAB — VALPROIC ACID LEVEL: Valproic Acid Lvl: 31 — ABNORMAL LOW

## 2011-03-16 LAB — PROTIME-INR
INR: 0.9
Prothrombin Time: 12.6

## 2013-05-10 ENCOUNTER — Other Ambulatory Visit: Payer: Self-pay | Admitting: Endocrinology

## 2013-05-10 ENCOUNTER — Encounter: Payer: Self-pay | Admitting: Radiology

## 2013-05-10 DIAGNOSIS — E041 Nontoxic single thyroid nodule: Secondary | ICD-10-CM

## 2013-05-22 ENCOUNTER — Other Ambulatory Visit: Payer: Self-pay

## 2015-07-19 ENCOUNTER — Emergency Department (HOSPITAL_COMMUNITY): Payer: Self-pay

## 2015-07-19 ENCOUNTER — Emergency Department (HOSPITAL_COMMUNITY)
Admission: EM | Admit: 2015-07-19 | Discharge: 2015-07-19 | Disposition: A | Discharge status: Home / Self Care | Payer: Self-pay | Attending: Emergency Medicine | Admitting: Emergency Medicine

## 2015-07-19 ENCOUNTER — Encounter (HOSPITAL_COMMUNITY): Payer: Self-pay | Admitting: *Deleted

## 2015-07-19 DIAGNOSIS — N83201 Unspecified ovarian cyst, right side: Secondary | ICD-10-CM

## 2015-07-19 DIAGNOSIS — Z9049 Acquired absence of other specified parts of digestive tract: Secondary | ICD-10-CM | POA: Insufficient documentation

## 2015-07-19 DIAGNOSIS — R102 Pelvic and perineal pain unspecified side: Secondary | ICD-10-CM

## 2015-07-19 DIAGNOSIS — Z3202 Encounter for pregnancy test, result negative: Secondary | ICD-10-CM | POA: Insufficient documentation

## 2015-07-19 DIAGNOSIS — R10A0 Flank pain, unspecified side: Secondary | ICD-10-CM

## 2015-07-19 DIAGNOSIS — Z90710 Acquired absence of both cervix and uterus: Secondary | ICD-10-CM | POA: Insufficient documentation

## 2015-07-19 DIAGNOSIS — R109 Unspecified abdominal pain: Secondary | ICD-10-CM

## 2015-07-19 DIAGNOSIS — N390 Urinary tract infection, site not specified: Secondary | ICD-10-CM

## 2015-07-19 LAB — CBC WITH DIFFERENTIAL/PLATELET
BASOS ABS: 0.1 10*3/uL (ref 0.0–0.1)
BASOS PCT: 1 %
EOS ABS: 0.1 10*3/uL (ref 0.0–0.7)
Eosinophils Relative: 2 %
HEMATOCRIT: 39.5 % (ref 36.0–46.0)
Hemoglobin: 13.1 g/dL (ref 12.0–15.0)
Lymphocytes Relative: 17 %
Lymphs Abs: 1.3 10*3/uL (ref 0.7–4.0)
MCH: 30.9 pg (ref 26.0–34.0)
MCHC: 33.2 g/dL (ref 30.0–36.0)
MCV: 93.2 fL (ref 78.0–100.0)
Monocytes Absolute: 0.8 10*3/uL (ref 0.1–1.0)
Monocytes Relative: 10 %
NEUTROS ABS: 5.4 10*3/uL (ref 1.7–7.7)
NEUTROS PCT: 70 %
Platelets: 349 10*3/uL (ref 150–400)
RBC: 4.24 MIL/uL (ref 3.87–5.11)
RDW: 13.5 % (ref 11.5–15.5)
WBC: 7.6 10*3/uL (ref 4.0–10.5)

## 2015-07-19 LAB — URINALYSIS, ROUTINE W REFLEX MICROSCOPIC
BILIRUBIN URINE: NEGATIVE
Glucose, UA: NEGATIVE mg/dL
KETONES UR: NEGATIVE mg/dL
NITRITE: POSITIVE — AB
PH: 5 (ref 5.0–8.0)
Protein, ur: NEGATIVE mg/dL
Specific Gravity, Urine: 1.037 — ABNORMAL HIGH (ref 1.005–1.030)

## 2015-07-19 LAB — URINE MICROSCOPIC-ADD ON

## 2015-07-19 LAB — COMPREHENSIVE METABOLIC PANEL
ALBUMIN: 4.1 g/dL (ref 3.5–5.0)
ALT: 15 U/L (ref 14–54)
ANION GAP: 11 (ref 5–15)
AST: 19 U/L (ref 15–41)
Alkaline Phosphatase: 43 U/L (ref 38–126)
BILIRUBIN TOTAL: 0.5 mg/dL (ref 0.3–1.2)
BUN: 12 mg/dL (ref 6–20)
CALCIUM: 9.7 mg/dL (ref 8.9–10.3)
CO2: 25 mmol/L (ref 22–32)
CREATININE: 0.78 mg/dL (ref 0.44–1.00)
Chloride: 109 mmol/L (ref 101–111)
GFR calc non Af Amer: >60 mL/min (ref >60)
GLUCOSE: 124 mg/dL — AB (ref 65–99)
POTASSIUM: 3.2 mmol/L — AB (ref 3.5–5.1)
Sodium: 145 mmol/L (ref 135–145)
TOTAL PROTEIN: 7.6 g/dL (ref 6.5–8.1)

## 2015-07-19 LAB — PREGNANCY, URINE: PREG TEST UR: NEGATIVE

## 2015-07-19 MED ORDER — KETOROLAC TROMETHAMINE 15 MG/ML IJ SOLN
15.0000 mg | Freq: Once | INTRAMUSCULAR | Status: AC
  Administered 2015-07-19: 15 mg via INTRAVENOUS
  Filled 2015-07-19: qty 1

## 2015-07-19 MED ORDER — SODIUM CHLORIDE 0.9 % IV BOLUS (SEPSIS)
500.0000 mL | Freq: Once | INTRAVENOUS | Status: AC
  Administered 2015-07-19: 500 mL via INTRAVENOUS

## 2015-07-19 MED ORDER — CEPHALEXIN 500 MG PO CAPS: 500.0000 mg | ORAL_CAPSULE | Freq: Four times a day (QID) | ORAL | Status: DC

## 2015-07-19 MED ORDER — POTASSIUM CHLORIDE CRYS ER 20 MEQ PO TBCR
20.0000 meq | EXTENDED_RELEASE_TABLET | Freq: Once | ORAL | Status: AC
  Administered 2015-07-19: 20 meq via ORAL
  Filled 2015-07-19: qty 1

## 2015-07-19 MED ORDER — DEXTROSE 5 % IV SOLN
1.0000 g | Freq: Once | INTRAVENOUS | Status: AC
  Administered 2015-07-19: 1 g via INTRAVENOUS
  Filled 2015-07-19: qty 10

## 2015-07-19 MED ORDER — PROMETHAZINE HCL 25 MG PO TABS
25.0000 mg | ORAL_TABLET | Freq: Four times a day (QID) | ORAL | Status: DC | PRN
Start: 2015-07-19 — End: 2016-10-24

## 2015-07-19 MED ORDER — ONDANSETRON HCL 4 MG/2ML IJ SOLN
4.0000 mg | Freq: Once | INTRAMUSCULAR | Status: AC
  Administered 2015-07-19: 4 mg via INTRAVENOUS
  Filled 2015-07-19: qty 2

## 2015-07-19 NOTE — ED Notes (Signed)
Patient transported to CT 

## 2015-07-19 NOTE — ED Notes (Signed)
Pt reports R flank pain x 2 weeks, became severe last night.  Pt reports having the same pain x 1 month ago.  Took AZO and pain went away.  Pt reports taking a generic brand of AZO last night and noticed that her urine was dark.   Pt denies any hematuria or dysuria at this time.

## 2015-07-19 NOTE — Discharge Instructions (Signed)
Your ultrasound today showed a cyst on your right ovary.  This will need to be rechecked in the next 1-2 months - call to establish follow up.  You also have a urinary tract infection.  Drink plenty of fluids and complete the course of antibiotic.  Get rechecked immediately if you develop fevers, uncontrolled pain, or new concerning symptoms.  You can take ibuprofen, available over the counter, as needed for pain according to label instructions.    Flank Pain Flank pain refers to pain that is located on the side of the body between the upper abdomen and the back. The pain may occur over a short period of time (acute) or may be long-term or reoccurring (chronic). It may be mild or severe. Flank pain can be caused by many things. CAUSES  Some of the more common causes of flank pain include:  Muscle strains.   Muscle spasms.   A disease of your spine (vertebral disk disease).   A lung infection (pneumonia).   Fluid around your lungs (pulmonary edema).   A kidney infection.   Kidney stones.   A very painful skin rash caused by the chickenpox virus (shingles).   Gallbladder disease.  HOME CARE INSTRUCTIONS  Home care will depend on the cause of your pain. In general,  Rest as directed by your caregiver.  Drink enough fluids to keep your urine clear or pale yellow.  Only take over-the-counter or prescription medicines as directed by your caregiver. Some medicines may help relieve the pain.  Tell your caregiver about any changes in your pain.  Follow up with your caregiver as directed. SEEK IMMEDIATE MEDICAL CARE IF:   Your pain is not controlled with medicine.   You have new or worsening symptoms.  Your pain increases.   You have abdominal pain.   You have shortness of breath.   You have persistent nausea or vomiting.   You have swelling in your abdomen.   You feel faint or pass out.   You have blood in your urine.  You have a fever or persistent  symptoms for more than 2-3 days.  You have a fever and your symptoms suddenly get worse. MAKE SURE YOU:   Understand these instructions.  Will watch your condition.  Will get help right away if you are not doing well or get worse.   This information is not intended to replace advice given to you by your health care provider. Make sure you discuss any questions you have with your health care provider.   Document Released: 07/29/2005 Document Revised: 03/01/2012 Document Reviewed: 01/20/2012 Elsevier Interactive Patient Education 2016 [employer]  Urinary Tract Infection Urinary tract infections (UTIs) can develop anywhere along your urinary tract. Your urinary tract is your body's drainage system for removing wastes and extra water. Your urinary tract includes two kidneys, two ureters, a bladder, and a urethra. Your kidneys are a pair of bean-shaped organs. Each kidney is about the size of your fist. They are located below your ribs, one on each side of your spine. CAUSES Infections are caused by microbes, which are microscopic organisms, including fungi, viruses, and bacteria. These organisms are so small that they can only be seen through a microscope. Bacteria are the microbes that most commonly cause UTIs. SYMPTOMS  Symptoms of UTIs may vary by age and gender of the patient and by the location of the infection. Symptoms in young women typically include a frequent and intense urge to urinate and a painful, burning feeling  in the bladder or urethra during urination. Older women and men are more likely to be tired, shaky, and weak and have muscle aches and abdominal pain. A fever may mean the infection is in your kidneys. Other symptoms of a kidney infection include pain in your back or sides below the ribs, nausea, and vomiting. DIAGNOSIS To diagnose a UTI, your caregiver will ask you about your symptoms. Your caregiver will also ask you to provide a urine sample. The urine sample will be  tested for bacteria and white blood cells. White blood cells are made by your body to help fight infection. TREATMENT  Typically, UTIs can be treated with medication. Because most UTIs are caused by a bacterial infection, they usually can be treated with the use of antibiotics. The choice of antibiotic and length of treatment depend on your symptoms and the type of bacteria causing your infection. HOME CARE INSTRUCTIONS  If you were prescribed antibiotics, take them exactly as your caregiver instructs you. Finish the medication even if you feel better after you have only taken some of the medication.  Drink enough water and fluids to keep your urine clear or pale yellow.  Avoid caffeine, tea, and carbonated beverages. They tend to irritate your bladder.  Empty your bladder often. Avoid holding urine for long periods of time.  Empty your bladder before and after sexual intercourse.  After a bowel movement, women should cleanse from front to back. Use each tissue only once. SEEK MEDICAL CARE IF:   You have back pain.  You develop a fever.  Your symptoms do not begin to resolve within 3 days. SEEK IMMEDIATE MEDICAL CARE IF:   You have severe back pain or lower abdominal pain.  You develop chills.  You have nausea or vomiting.  You have continued burning or discomfort with urination. MAKE SURE YOU:   Understand these instructions.  Will watch your condition.  Will get help right away if you are not doing well or get worse.   This information is not intended to replace advice given to you by your health care provider. Make sure you discuss any questions you have with your health care provider.   Document Released: 03/17/2005 Document Revised: 02/26/2015 Document Reviewed: 07/16/2011 Elsevier Interactive Patient Education 2016 [employer] Inc[employer] Ovarian Cyst An ovarian cyst is a fluid-filled sac that forms on an ovary. The ovaries are small organs that produce eggs in women.  Various types of cysts can form on the ovaries. Most are not cancerous. Many do not cause problems, and they often go away on their own. Some may cause symptoms and require treatment. Common types of ovarian cysts include:  Functional cysts--These cysts may occur every month during the menstrual cycle. This is normal. The cysts usually go away with the next menstrual cycle if the woman does not get pregnant. Usually, there are no symptoms with a functional cyst.  Endometrioma cysts--These cysts form from the tissue that lines the uterus. They are also called "chocolate cysts" because they become filled with blood that turns brown. This type of cyst can cause pain in the lower abdomen during intercourse and with your menstrual period.  Cystadenoma cysts--This type develops from the cells on the outside of the ovary. These cysts can get very big and cause lower abdomen pain and pain with intercourse. This type of cyst can twist on itself, cut off its blood supply, and cause severe pain. It can also easily rupture and cause a lot of pain.  Dermoid  cysts--This type of cyst is sometimes found in both ovaries. These cysts may contain different kinds of body tissue, such as skin, teeth, hair, or cartilage. They usually do not cause symptoms unless they get very big.  Theca lutein cysts--These cysts occur when too much of a certain hormone (human chorionic gonadotropin) is produced and overstimulates the ovaries to produce an egg. This is most common after procedures used to assist with the conception of a baby (in vitro fertilization). CAUSES   Fertility drugs can cause a condition in which multiple large cysts are formed on the ovaries. This is called ovarian hyperstimulation syndrome.  A condition called polycystic ovary syndrome can cause hormonal imbalances that can lead to nonfunctional ovarian cysts. SIGNS AND SYMPTOMS  Many ovarian cysts do not cause symptoms. If symptoms are present, they may  include:  Pelvic pain or pressure.  Pain in the lower abdomen.  Pain during sexual intercourse.  Increasing girth (swelling) of the abdomen.  Abnormal menstrual periods.  Increasing pain with menstrual periods.  Stopping having menstrual periods without being pregnant. DIAGNOSIS  These cysts are commonly found during a routine or annual pelvic exam. Tests may be ordered to find out more about the cyst. These tests may include:  Ultrasound.  X-ray of the pelvis.  CT scan.  MRI.  Blood tests. TREATMENT  Many ovarian cysts go away on their own without treatment. Your health care provider may want to check your cyst regularly for 2-3 months to see if it changes. For women in menopause, it is particularly important to monitor a cyst closely because of the higher rate of ovarian cancer in menopausal women. When treatment is needed, it may include any of the following:  A procedure to drain the cyst (aspiration). This may be done using a long needle and ultrasound. It can also be done through a laparoscopic procedure. This involves using a thin, lighted tube with a tiny camera on the end (laparoscope) inserted through a small incision.  Surgery to remove the whole cyst. This may be done using laparoscopic surgery or an open surgery involving a larger incision in the lower abdomen.  Hormone treatment or birth control pills. These methods are sometimes used to help dissolve a cyst. HOME CARE INSTRUCTIONS   Only take over-the-counter or prescription medicines as directed by your health care provider.  Follow up with your health care provider as directed.  Get regular pelvic exams and Pap tests. SEEK MEDICAL CARE IF:   Your periods are late, irregular, or painful, or they stop.  Your pelvic pain or abdominal pain does not go away.  Your abdomen becomes larger or swollen.  You have pressure on your bladder or trouble emptying your bladder completely.  You have pain during  sexual intercourse.  You have feelings of fullness, pressure, or discomfort in your stomach.  You lose weight for no apparent reason.  You feel generally ill.  You become constipated.  You lose your appetite.  You develop acne.  You have an increase in body and facial hair.  You are gaining weight, without changing your exercise and eating habits.  You think you are pregnant. SEEK IMMEDIATE MEDICAL CARE IF:   You have increasing abdominal pain.  You feel sick to your stomach (nauseous), and you throw up (vomit).  You develop a fever that comes on suddenly.  You have abdominal pain during a bowel movement.  Your menstrual periods become heavier than usual. MAKE SURE YOU:  Understand these instructions.  Will watch your condition.  Will get help right away if you are not doing well or get worse.   This information is not intended to replace advice given to you by your health care provider. Make sure you discuss any questions you have with your health care provider.   Document Released: 06/07/2005 Document Revised: 06/12/2013 Document Reviewed: 02/12/2013 Elsevier Interactive Patient Education [employer]

## 2015-07-19 NOTE — ED Provider Notes (Signed)
CSN: [id number]     Arrival date & time 07/19/15  6301 History   First MD Initiated Contact with Patient 07/19/15 0912     Chief Complaint  Patient presents with  . Flank Pain     Patient is a 50 y.o. female presenting with flank pain. The history is provided by the patient. No language interpreter was used.  Flank Pain   [name] is a 50 y.o. female who presents to the Emergency Department complaining of flank pain. She reports she weeks of constant, burning, right flank pain. Pain is worse with activity but is there at all times. She had some hematuria a month ago and took Azo and it resolved no recurrent hematuria. She took Azo again last night to see if it would help her flank pain. Pain is worse today. She reports a foul-smelling urine on her first urination of the day for the last few days but no dysuria or urinary frequency. She denies any fevers, abdominal pain, nausea, vomiting, vaginal discharge, numbness, weakness, injuries.   History reviewed. No pertinent past medical history. Past Surgical History  Procedure Laterality Date  . Cholecystectomy    . Abdominal hysterectomy     No family history on file. Social History  Substance Use Topics  . Smoking status: Never Smoker   . Smokeless tobacco: None  . Alcohol Use: No   OB History    No data available     Review of Systems  Genitourinary: Positive for flank pain.  All other systems reviewed and are negative.     Allergies  Review of patient's allergies indicates no known allergies.  Home Medications   Prior to Admission medications   Medication Sig Start Date End Date Taking? Authorizing Provider  acetaminophen (TYLENOL) 325 MG tablet Take 650 mg by mouth every 6 (six) hours as needed for moderate pain.   Yes Historical Provider, MD  phenazopyridine (PYRIDIUM) 95 MG tablet Take 95 mg by mouth 3 (three) times daily as needed (urinary pain).   Yes Historical Provider, MD  cephALEXin (KEFLEX) 500 MG  capsule Take 1 capsule (500 mg total) by mouth 4 (four) times daily. 07/19/15   Tilden Fossa, MD  promethazine (PHENERGAN) 25 MG tablet Take 1 tablet (25 mg total) by mouth every 6 (six) hours as needed for nausea or vomiting. 07/19/15   Tilden Fossa, MD   BP 154/89 mmHg  Pulse 97  Temp(Src) 98.9 F (37.2 C) (Oral)  Resp 16  Ht  (1.676 m)  Wt 129 lb 9.6 oz (58.786 kg)  BMI 20.93 kg/m2  SpO2 98%  LMP  Physical Exam  Constitutional: She is oriented to person, place, and time. She appears well-developed and well-nourished.  HENT:  Head: Normocephalic and atraumatic.  Cardiovascular: Normal rate and regular rhythm.   No murmur heard. Pulmonary/Chest: Effort normal and breath sounds normal. No respiratory distress.  Abdominal: Soft. There is no tenderness. There is no rebound and no guarding.  Right CVA tenderness  Genitourinary:  Mild right adnexal tenderness without fullness.   Musculoskeletal: She exhibits no edema or tenderness.  Neurological: She is alert and oriented to person, place, and time.  5/5 strength in all four extremities  Skin: Skin is warm and dry.  Psychiatric: She has a normal mood and affect. Her behavior is normal.  Nursing note and vitals reviewed.   ED Course  Procedures (including critical care time) Labs Review Labs Reviewed  URINALYSIS, ROUTINE W REFLEX MICROSCOPIC (NOT AT [facility]) -  Abnormal; Notable for the following:    Color, Urine ORANGE (*)    APPearance HAZY (*)    Specific Gravity, Urine 1.037 (*)    Hgb urine dipstick TRACE (*)    Nitrite POSITIVE (*)    Leukocytes, UA SMALL (*)    All other components within normal limits  COMPREHENSIVE METABOLIC PANEL - Abnormal; Notable for the following:    Potassium 3.2 (*)    Glucose, Bld 124 (*)    All other components within normal limits  URINE MICROSCOPIC-ADD ON - Abnormal; Notable for the following:    Squamous Epithelial / LPF 0-5 (*)    Bacteria, UA MANY (*)    All other components  within normal limits  CBC WITH DIFFERENTIAL/PLATELET  PREGNANCY, URINE  POC URINE PREG, ED    Imaging Review US Transvaginal Non-ob  07/19/2015  CLINICAL DATA:  Right flank and back pain for 2 weeks. EXAM: TRANSABDOMINAL AND TRANSVAGINAL ULTRASOUND OF PELVIS TECHNIQUE: Both transabdominal and transvaginal ultrasound examinations of the pelvis were performed. Transabdominal technique was performed for global imaging of the pelvis including uterus, ovaries, adnexal regions, and pelvic cul-de-sac. It was necessary to proceed with endovaginal exam following the transabdominal exam to visualize the right ovary and adnexae in better detail. COMPARISON:  07/19/2015 FINDINGS: Uterus Surgically absent Right ovary Measurements: 3.1 x 1.7 x 1.6 cm. Complex right adnexal ovarian/paraovarian fluid collection or cystic structure noted by ultrasound. This correlates with the CT finding. By ultrasound this measures 4.7 x 5.7 x 5.2 cm. Appearance remains nonspecific by ultrasound and could represent an ovarian/paraovarian cyst, other less likely considerations include a pocket of loculated ascites or hydrosalpinx. Left ovary Removed Other findings No definite dependent free fluid. IMPRESSION: Right adnexal ovarian/paraovarian fluid collection or cystic structure measuring 5.7 cm, correlating with the CT finding. Appearance remains nonspecific by ultrasound. Suspect large adnexal cyst. Other considerations as above. Prior hysterectomy and left oophorectomy Electronically Signed   By: Judie Petit.  Shick M.D.   On: 07/19/2015 13:59   US Pelvis Complete  07/19/2015  CLINICAL DATA:  Right flank and back pain for 2 weeks. EXAM: TRANSABDOMINAL AND TRANSVAGINAL ULTRASOUND OF PELVIS TECHNIQUE: Both transabdominal and transvaginal ultrasound examinations of the pelvis were performed. Transabdominal technique was performed for global imaging of the pelvis including uterus, ovaries, adnexal regions, and pelvic cul-de-sac. It was necessary to  proceed with endovaginal exam following the transabdominal exam to visualize the right ovary and adnexae in better detail. COMPARISON:  07/19/2015 FINDINGS: Uterus Surgically absent Right ovary Measurements: 3.1 x 1.7 x 1.6 cm. Complex right adnexal ovarian/paraovarian fluid collection or cystic structure noted by ultrasound. This correlates with the CT finding. By ultrasound this measures 4.7 x 5.7 x 5.2 cm. Appearance remains nonspecific by ultrasound and could represent an ovarian/paraovarian cyst, other less likely considerations include a pocket of loculated ascites or hydrosalpinx. Left ovary Removed Other findings No definite dependent free fluid. IMPRESSION: Right adnexal ovarian/paraovarian fluid collection or cystic structure measuring 5.7 cm, correlating with the CT finding. Appearance remains nonspecific by ultrasound. Suspect large adnexal cyst. Other considerations as above. Prior hysterectomy and left oophorectomy Electronically Signed   By: Judie Petit.  Shick M.D.   On: 07/19/2015 13:59   Ct Renal Stone Study  07/19/2015  CLINICAL DATA:  50 year old female with right flank and abdominal pain for 2 weeks, worsening for 1 day. EXAM: CT ABDOMEN AND PELVIS WITHOUT CONTRAST TECHNIQUE: Multidetector CT imaging of the abdomen and pelvis was performed following the standard protocol without IV contrast.  COMPARISON:  07/26/2003 CT FINDINGS: Please note that parenchymal abnormalities may be missed without intravenous contrast. Lower chest:  Minimal right basilar scarring again noted. Hepatobiliary: The liver is unremarkable. The patient is status post cholecystectomy. There is no evidence of biliary dilatation. Pancreas: Unremarkable Spleen: Unremarkable Adrenals/Urinary Tract: The kidneys, adrenal glands and bladder are unremarkable. There is no evidence of hydronephrosis or urinary calculi. Stomach/Bowel: Unremarkable. No evidence of bowel obstruction or definite bowel wall thickening. The appendix is normal.  Vascular/Lymphatic: No enlarged lymph nodes or abdominal aortic aneurysm. Reproductive: A 4.5 x 6 cm cystic structure in the right adnexal region may represent an ovarian cyst. The patient is status post hysterectomy. There is no evidence of left adnexal mass. Other: No free fluid, focal collection or pneumoperitoneum. Musculoskeletal: No acute or suspicious abnormality. IMPRESSION: 4.5 x 6 cm probable right adnexal/ovarian cyst. No evidence of free fluid. Pelvic ultrasound is recommended for further evaluation. No other acute or significant abnormalities identified. Electronically Signed   By: Harmon Pier M.D.   On: 07/19/2015 11:00   I have personally reviewed and evaluated these images and lab results as part of my medical decision-making.   EKG Interpretation None      MDM   Final diagnoses:  Acute UTI  Cyst of right ovary  Flank pain, acute    Patient here for evaluation of right flank pain, foul-smelling urine. UA is concerning for UTI given many bacteria present, dipstick is difficult to interpret given the Azo use. Treating with one-time dose of Rocephin as well as Keflex. CT study is negative for obstructive stone. There is incidental finding of an adnexal cyst. Pelvic exam with minimal local tenderness. Ultrasound demonstrates a cyst. The plan to DC home with outpatient OB/GYN follow-up regarding her adnexal cyst. Discussed outpatient follow-up as well as return precautions.    Tilden Fossa, MD 07/19/15 1630

## 2015-12-17 ENCOUNTER — Encounter (HOSPITAL_COMMUNITY): Payer: Self-pay | Admitting: Emergency Medicine

## 2015-12-17 ENCOUNTER — Emergency Department (HOSPITAL_COMMUNITY)
Admission: EM | Admit: 2015-12-17 | Discharge: 2015-12-17 | Disposition: A | Discharge status: Home / Self Care | Payer: BLUE CROSS/BLUE SHIELD | Attending: Emergency Medicine | Admitting: Emergency Medicine

## 2015-12-17 DIAGNOSIS — I159 Secondary hypertension, unspecified: Secondary | ICD-10-CM | POA: Insufficient documentation

## 2015-12-17 DIAGNOSIS — I10 Essential (primary) hypertension: Secondary | ICD-10-CM | POA: Diagnosis present

## 2015-12-17 DIAGNOSIS — Z7982 Long term (current) use of aspirin: Secondary | ICD-10-CM | POA: Insufficient documentation

## 2015-12-17 DIAGNOSIS — Z79899 Other long term (current) drug therapy: Secondary | ICD-10-CM | POA: Insufficient documentation

## 2015-12-17 LAB — CBC WITH DIFFERENTIAL/PLATELET
BASOS ABS: 0.1 10*3/uL (ref 0.0–0.1)
BASOS PCT: 2 %
EOS ABS: 0.2 10*3/uL (ref 0.0–0.7)
EOS PCT: 3 %
HEMATOCRIT: 36.4 % (ref 36.0–46.0)
Hemoglobin: 12 g/dL (ref 12.0–15.0)
Lymphocytes Relative: 32 %
Lymphs Abs: 1.9 10*3/uL (ref 0.7–4.0)
MCH: 30.3 pg (ref 26.0–34.0)
MCHC: 33 g/dL (ref 30.0–36.0)
MCV: 91.9 fL (ref 78.0–100.0)
MONO ABS: 0.4 10*3/uL (ref 0.1–1.0)
Monocytes Relative: 8 %
NEUTROS ABS: 3.3 10*3/uL (ref 1.7–7.7)
Neutrophils Relative %: 55 %
PLATELETS: 328 10*3/uL (ref 150–400)
RBC: 3.96 MIL/uL (ref 3.87–5.11)
RDW: 13.4 % (ref 11.5–15.5)
WBC: 5.8 10*3/uL (ref 4.0–10.5)

## 2015-12-17 LAB — I-STAT CHEM 8, ED
BUN: 12 mg/dL (ref 6–20)
Calcium, Ion: 1.1 mmol/L — ABNORMAL LOW (ref 1.13–1.30)
Chloride: 108 mmol/L (ref 101–111)
Creatinine, Ser: 0.8 mg/dL (ref 0.44–1.00)
Glucose, Bld: 118 mg/dL — ABNORMAL HIGH (ref 65–99)
HEMATOCRIT: 39 % (ref 36.0–46.0)
HEMOGLOBIN: 13.3 g/dL (ref 12.0–15.0)
POTASSIUM: 3.8 mmol/L (ref 3.5–5.1)
SODIUM: 140 mmol/L (ref 135–145)
TCO2: 24 mmol/L (ref 0–100)

## 2015-12-17 LAB — I-STAT TROPONIN, ED: TROPONIN I, POC: 0 ng/mL (ref 0.00–0.08)

## 2015-12-17 MED ORDER — ONDANSETRON 4 MG PO TBDP: 4.0000 mg | ORAL_TABLET | Freq: Three times a day (TID) | ORAL | Status: DC | PRN

## 2015-12-17 MED ORDER — BISOPROLOL-HYDROCHLOROTHIAZIDE 5-6.25 MG PO TABS: 1.0000 | ORAL_TABLET | Freq: Every day | ORAL | Status: DC

## 2015-12-17 NOTE — Discharge Instructions (Signed)
zofran as prescribed as needed for nausea. Start blood pressure medications. Follow up with primary care doctor. Start exercising, eat healthy, see DASH diet below. Return if any issues.     Hypertension Hypertension, commonly called high blood pressure, is when the force of blood pumping through your arteries is too strong. Your arteries are the blood vessels that carry blood from your heart throughout your body. A blood pressure reading consists of a higher number over a lower number, such as 110/72. The higher number (systolic) is the pressure inside your arteries when your heart pumps. The lower number (diastolic) is the pressure inside your arteries when your heart relaxes. Ideally you want your blood pressure below 120/80. Hypertension forces your heart to work harder to pump blood. Your arteries may become narrow or stiff. Having untreated or uncontrolled hypertension can cause heart attack, stroke, kidney disease, and other problems. RISK FACTORS Some risk factors for high blood pressure are controllable. Others are not.  Risk factors you cannot control include:   Race. You may be at higher risk if you are African American.  Age. Risk increases with age.  Gender. Men are at higher risk than women before age 49 years. After age 49, women are at higher risk than men. Risk factors you can control include:  Not getting enough exercise or physical activity.  Being overweight.  Getting too much fat, sugar, calories, or salt in your diet.  Drinking too much alcohol. SIGNS AND SYMPTOMS Hypertension does not usually cause signs or symptoms. Extremely high blood pressure (hypertensive crisis) may cause headache, anxiety, shortness of breath, and nosebleed. DIAGNOSIS To check if you have hypertension, your health care provider will measure your blood pressure while you are seated, with your arm held at the level of your heart. It should be measured at least twice using the same arm. Certain  conditions can cause a difference in blood pressure between your right and left arms. A blood pressure reading that is higher than normal on one occasion does not mean that you need treatment. If it is not clear whether you have high blood pressure, you may be asked to return on a different day to have your blood pressure checked again. Or, you may be asked to monitor your blood pressure at home for 1 or more weeks. TREATMENT Treating high blood pressure includes making lifestyle changes and possibly taking medicine. Living a healthy lifestyle can help lower high blood pressure. You may need to change some of your habits. Lifestyle changes may include:  Following the DASH diet. This diet is high in fruits, vegetables, and whole grains. It is low in salt, red meat, and added sugars.  Keep your sodium intake below 2,300 mg per day.  Getting at least 30-45 minutes of aerobic exercise at least 4 times per week.  Losing weight if necessary.  Not smoking.  Limiting alcoholic beverages.  Learning ways to reduce stress. Your health care provider may prescribe medicine if lifestyle changes are not enough to get your blood pressure under control, and if one of the following is true:  You are 8318-49 years of age and your systolic blood pressure is above 140.  You are 49 years of age or older, and your systolic blood pressure is above 150.  Your diastolic blood pressure is above 90.  You have diabetes, and your systolic blood pressure is over 140 or your diastolic blood pressure is over 90.  You have kidney disease and your blood pressure is above  140/90.  You have heart disease and your blood pressure is above 140/90. Your personal target blood pressure may vary depending on your medical conditions, your age, and other factors. HOME CARE INSTRUCTIONS  Have your blood pressure rechecked as directed by your health care provider.   Take medicines only as directed by your health care provider.  Follow the directions carefully. Blood pressure medicines must be taken as prescribed. The medicine does not work as well when you skip doses. Skipping doses also puts you at risk for problems.  Do not smoke.   Monitor your blood pressure at home as directed by your health care provider. SEEK MEDICAL CARE IF:   You think you are having a reaction to medicines taken.  You have recurrent headaches or feel dizzy.  You have swelling in your ankles.  You have trouble with your vision. SEEK IMMEDIATE MEDICAL CARE IF:  You develop a severe headache or confusion.  You have unusual weakness, numbness, or feel faint.  You have severe chest or abdominal pain.  You vomit repeatedly.  You have trouble breathing. MAKE SURE YOU:   Understand these instructions.  Will watch your condition.  Will get help right away if you are not doing well or get worse.   This information is not intended to replace advice given to you by your health care provider. Make sure you discuss any questions you have with your health care provider.   Document Released: 06/07/2005 Document Revised: 10/22/2014 Document Reviewed: 03/30/2013 Elsevier Interactive Patient Education 2016 Else[employer] Inc[employer]  DASH Eating Plan DASH stands for "Dietary Approaches to Stop Hypertension." The DASH eating plan is a healthy eating plan that has been shown to reduce high blood pressure (hypertension). Additional health benefits may include reducing the risk of type 2 diabetes mellitus, heart disease, and stroke. The DASH eating plan may also help with weight loss. WHAT DO I NEED TO KNOW ABOUT THE DASH EATING PLAN? For the DASH eating plan, you will follow these general guidelines:  Choose foods with a percent daily value for sodium of less than 5% (as listed on the food label).  Use salt-free seasonings or herbs instead of table salt or sea salt.  Check with your health care provider or pharmacist before using salt  substitutes.  Eat lower-sodium products, often labeled as "lower sodium" or "no salt added."  Eat fresh foods.  Eat more vegetables, fruits, and low-fat dairy products.  Choose whole grains. Look for the word "whole" as the first word in the ingredient list.  Choose fish and skinless chicken or [place] more often than red meat. Limit fish, poultry, and meat to 6 oz (170 g) each day.  Limit sweets, desserts, sugars, and sugary drinks.  Choose heart-healthy fats.  Limit cheese to 1 oz (28 g) per day.  Eat more home-cooked food and less restaurant, buffet, and fast food.  Limit fried foods.  Cook foods using methods other than frying.  Limit canned vegetables. If you do use them, rinse them well to decrease the sodium.  When eating at a restaurant, ask that your food be prepared with less salt, or no salt if possible. WHAT FOODS CAN I EAT? Seek help from a dietitian for individual calorie needs. Grains Whole grain or whole wheat bread. Brown rice. Whole grain or whole wheat pasta. Quinoa, bulgur, and whole grain cereals. Low-sodium cereals. Corn or whole wheat flour tortillas. Whole grain cornbread. Whole grain crackers. Low-sodium crackers. Vegetables Fresh or frozen vegetables (raw, steamed, roasted,  or grilled). Low-sodium or reduced-sodium tomato and vegetable juices. Low-sodium or reduced-sodium tomato sauce and paste. Low-sodium or reduced-sodium canned vegetables.  Fruits All fresh, canned (in natural juice), or frozen fruits. Meat and Other Protein Products Ground beef (85% or leaner), grass-fed beef, or beef trimmed of fat. Skinless chicken or [place]. Ground chicken or [place]. Pork trimmed of fat. All fish and seafood. Eggs. Dried beans, peas, or lentils. Unsalted nuts and seeds. Unsalted canned beans. Dairy Low-fat dairy products, such as skim or 1% milk, 2% or reduced-fat cheeses, low-fat ricotta or cottage cheese, or plain low-fat yogurt. Low-sodium or reduced-sodium  cheeses. Fats and Oils Tub margarines without trans fats. Light or reduced-fat mayonnaise and salad dressings (reduced sodium). Avocado. Safflower, olive, or canola oils. Natural peanut or almond butter. Other Unsalted popcorn and pretzels. The items listed above may not be a complete list of recommended foods or beverages. Contact your dietitian for more options. WHAT FOODS ARE NOT RECOMMENDED? Grains White bread. White pasta. White rice. Refined cornbread. Bagels and croissants. Crackers that contain trans fat. Vegetables Creamed or fried vegetables. Vegetables in a cheese sauce. Regular canned vegetables. Regular canned tomato sauce and paste. Regular tomato and vegetable juices. Fruits Dried fruits. Canned fruit in light or heavy syrup. Fruit juice. Meat and Other Protein Products Fatty cuts of meat. Ribs, chicken wings, bacon, sausage, bologna, salami, chitterlings, fatback, hot dogs, bratwurst, and packaged luncheon meats. Salted nuts and seeds. Canned beans with salt. Dairy Whole or 2% milk, cream, half-and-half, and cream cheese. Whole-fat or sweetened yogurt. Full-fat cheeses or blue cheese. Nondairy creamers and whipped toppings. Processed cheese, cheese spreads, or cheese curds. Condiments Onion and garlic salt, seasoned salt, table salt, and sea salt. Canned and packaged gravies. Worcestershire sauce. Tartar sauce. Barbecue sauce. Teriyaki sauce. Soy sauce, including reduced sodium. Steak sauce. Fish sauce. Oyster sauce. Cocktail sauce. Horseradish. Ketchup and mustard. Meat flavorings and tenderizers. Bouillon cubes. Hot sauce. Tabasco sauce. Marinades. Taco seasonings. Relishes. Fats and Oils Butter, stick margarine, lard, shortening, ghee, and bacon fat. Coconut, palm kernel, or palm oils. Regular salad dressings. Other Pickles and olives. Salted popcorn and pretzels. The items listed above may not be a complete list of foods and beverages to avoid. Contact your dietitian for  more information. WHERE CAN I FIND MORE INFORMATION? National Heart, Lung, and Blood Institute: [employer]   This information is not intended to replace advice given to you by your health care provider. Make sure you discuss any questions you have with your health care provider.   Document Released: 05/27/2011 Document Revised: 06/28/2014 Document Reviewed: 04/11/2013 Elsevier Interactive Patient Education [employer]

## 2015-12-17 NOTE — ED Provider Notes (Signed)
CSN: [id number]     Arrival date & time 12/17/15  1620 History   First MD Initiated Contact with Patient 12/17/15 1805     Chief Complaint  Patient presents with  . Headache  . Hypertension     (Consider location/radiation/quality/duration/timing/severity/associated sxs/prior Treatment) HPI [name] is a 50 y.o. female with history of hypertension, presents emergency department with elevated blood pressure and heart rate from her job. Patient states she was going to give blood at her work when they checked her blood pressure heart rate and was told to come to emergency department. Patient states that the reading was blood pressure 170/110, heart rate was 120. Patient states that she currently does not have a family doctor. She states that she used to have one, and used to be on blood pressure medications and on medication that'll control her heart rate. She states that she always had elevated heart rate. She admits that she walks a lot at work, however states she does not exercise and states that her diet is not healthy at this time. She reports intermittent headaches that come and go. She reports intermittent chest pain and intermittent tingling in the left hand. States that those are not exertional. Denies any swelling or changes in vision. Denies any symptoms at present. States that his blood pressure scared her and she came for evaluation.  History reviewed. No pertinent past medical history. Past Surgical History  Procedure Laterality Date  . Cholecystectomy    . Abdominal hysterectomy     History reviewed. No pertinent family history. Social History  Substance Use Topics  . Smoking status: Never Smoker   . Smokeless tobacco: None  . Alcohol Use: No   OB History    No data available     Review of Systems  Constitutional: Negative for fever and chills.  Respiratory: Positive for chest tightness. Negative for cough and shortness of breath.   Cardiovascular: Positive for  chest pain. Negative for palpitations and leg swelling.  Gastrointestinal: Negative for nausea, vomiting, abdominal pain and diarrhea.  Genitourinary: Negative for dysuria, flank pain, vaginal bleeding, vaginal discharge, vaginal pain and pelvic pain.  Musculoskeletal: Negative for myalgias, arthralgias, neck pain and neck stiffness.  Skin: Negative for rash.  Neurological: Positive for headaches. Negative for dizziness and weakness.  All other systems reviewed and are negative.     Allergies  Codeine  Home Medications   Prior to Admission medications   Medication Sig Start Date End Date Taking? Authorizing Provider  aspirin-acetaminophen-caffeine (MIGRAINE RELIEF) 250-250-65 MG tablet Take 2 tablets by mouth every 6 (six) hours as needed for headache or migraine.   Yes Historical Provider, MD  cephALEXin (KEFLEX) 500 MG capsule Take 1 capsule (500 mg total) by mouth 4 (four) times daily. Patient not taking: Reported on 12/17/2015 07/19/15   Tilden FossaElizabeth Rees, MD  promethazine (PHENERGAN) 25 MG tablet Take 1 tablet (25 mg total) by mouth every 6 (six) hours as needed for nausea or vomiting. Patient not taking: Reported on 12/17/2015 07/19/15   Tilden FossaElizabeth Rees, MD   BP 170/101 mmHg  Pulse 102  Temp(Src) 98.9 F (37.2 C) (Oral)  Resp 18  SpO2 100% Physical Exam  Constitutional: She is oriented to person, place, and time. She appears well-developed and well-nourished. No distress.  HENT:  Head: Normocephalic.  Eyes: Conjunctivae are normal.  Neck: Neck supple.  Cardiovascular: Normal rate, regular rhythm and normal heart sounds.   Pulmonary/Chest: Effort normal and breath sounds normal. No respiratory distress. She  has no wheezes. She has no rales.  Abdominal: Soft. Bowel sounds are normal. She exhibits no distension. There is no tenderness. There is no rebound.  Musculoskeletal: She exhibits no edema.  Neurological: She is alert and oriented to person, place, and time.  Skin: Skin is  warm and dry.  Psychiatric: She has a normal mood and affect. Her behavior is normal.  Nursing note and vitals reviewed.   ED Course  Procedures (including critical care time) Labs Review Labs Reviewed  I-STAT CHEM 8, ED - Abnormal; Notable for the following:    Glucose, Bld 118 (*)    Calcium, Ion 1.10 (*)    All other components within normal limits  CBC WITH DIFFERENTIAL/PLATELET  I-STAT TROPOININ, ED    Imaging Review No results found. I have personally reviewed and evaluated these images and lab results as part of my medical decision-making.   EKG Interpretation   Date/Time:  Wednesday December 17 2015 19:04:49 EDT Ventricular Rate:  90 PR Interval:    QRS Duration: 77 QT Interval:  377 QTC Calculation: 462 R Axis:   72 Text Interpretation:  Sinus rhythm Probable left atrial enlargement  Borderline T wave abnormalities No acute changes No significant change  since last tracing Confirmed by NANAVATI, MD, Janey GentaANKIT [id number]) on 12/17/2015  7:09:06 PM      MDM   Final diagnoses:  Secondary hypertension, unspecified   Pt with elevated blood pressure and HR on incidental measure during blood draw. Complaining of intermittent headaches and non specific pain in chest and left arm. Currently asymptomatic. Does not have a PCP at this time. Admits to poor diet to no exercise. We will check CBC, Chem-8, troponin, EKG. Current blood pressure is 170/101, heart rate is between 90 and 110.  8:02 PM Normal CBC, Chem-8, troponin. No significant EKG findings. Blood pressure is now 152/102, heart rate is in the 90s. Will discharge home, will start on Ziac. Follow with primary care doctor.  Filed Vitals:   12/17/15 1626 12/17/15 1846 12/17/15 1930 12/17/15 2000  BP: 175/104 170/101 152/102 162/95  Pulse: 96 102 93 88  Temp: 98.9 F (37.2 C)     TempSrc: Oral     Resp: 16 18 20 17   SpO2: 99% 100% 100% 100%     Jaynie Crumbleatyana Gladis Soley, PA-C 12/17/15 2041  Derwood KaplanAnkit Nanavati, MD 12/18/15  0000

## 2015-12-17 NOTE — ED Notes (Signed)
Pt c/o headache x about 4 days, intermittent left arm numbness not currently present. Palpitations on Sunday night, not currently present. No vision changes. Intermittent nausea, not currently present.

## 2016-05-18 ENCOUNTER — Encounter (HOSPITAL_COMMUNITY): Payer: Self-pay | Admitting: Emergency Medicine

## 2016-05-18 ENCOUNTER — Emergency Department (HOSPITAL_COMMUNITY)
Admission: EM | Admit: 2016-05-18 | Discharge: 2016-05-19 | Disposition: A | Discharge status: Home / Self Care | Payer: BLUE CROSS/BLUE SHIELD | Attending: Emergency Medicine | Admitting: Emergency Medicine

## 2016-05-18 DIAGNOSIS — R519 Headache, unspecified: Secondary | ICD-10-CM

## 2016-05-18 DIAGNOSIS — Z7982 Long term (current) use of aspirin: Secondary | ICD-10-CM | POA: Diagnosis not present

## 2016-05-18 DIAGNOSIS — I10 Essential (primary) hypertension: Secondary | ICD-10-CM | POA: Insufficient documentation

## 2016-05-18 DIAGNOSIS — R51 Headache: Secondary | ICD-10-CM | POA: Diagnosis not present

## 2016-05-18 DIAGNOSIS — Z79899 Other long term (current) drug therapy: Secondary | ICD-10-CM | POA: Diagnosis not present

## 2016-05-18 HISTORY — DX: Essential (primary) hypertension: I10

## 2016-05-18 MED ORDER — KETOROLAC TROMETHAMINE 30 MG/ML IJ SOLN
30.0000 mg | Freq: Once | INTRAMUSCULAR | Status: AC
  Administered 2016-05-18: 30 mg via INTRAVENOUS
  Filled 2016-05-18: qty 1

## 2016-05-18 MED ORDER — METOCLOPRAMIDE HCL 5 MG/ML IJ SOLN
10.0000 mg | INTRAMUSCULAR | Status: AC
  Administered 2016-05-18: 10 mg via INTRAVENOUS
  Filled 2016-05-18: qty 2

## 2016-05-18 MED ORDER — DIPHENHYDRAMINE HCL 50 MG/ML IJ SOLN
12.5000 mg | Freq: Once | INTRAMUSCULAR | Status: AC
  Administered 2016-05-18: 12.5 mg via INTRAVENOUS
  Filled 2016-05-18: qty 1

## 2016-05-18 NOTE — ED Provider Notes (Signed)
[facility]-EMERGENCY DEPT Provider Note   CSN: [id number] Arrival date & time: 05/18/16  1837  By signing my name below, I, Modena JanskyAlbert Thayil, attest that this documentation has been prepared under the direction and in the presence of non-physician practitioner, Antony MaduraKelly Diar Berkel, PA-C. Electronically Signed: Modena JanskyAlbert Thayil, Scribe. 05/18/2016. 10:44 PM.  History   Chief Complaint Chief Complaint  Patient presents with  . Headache  . Blurred Vision  . Nausea   The history is provided by the patient. No language interpreter was used.  Headache   This is a recurrent problem. The current episode started 6 to 12 hours ago. The problem occurs constantly. The problem has not changed since onset.The headache is associated with bright light. The pain is located in the frontal region. The pain is moderate. Radiates to: occipital region. Associated symptoms include nausea. Pertinent negatives include no vomiting. She has tried nothing for the symptoms.   HPI Comments: [name] is a 50 y.o. female with a hx of HTN and migraines who presents to the Emergency Department complaining of constant frontal moderate headache that started last night. She describes the pain as a pressure sensation that radiates to the occipital region. She states that today's pain is more severe than prior episodes of headaches, which were treated with Excedrin migraine medication. She reports associated symptoms of nausea, photophobia, and blurry vision. She admits to a hx of hysterectomy and cholecystectomy. She denies any vomiting, unilateral weakness/numbness, or other complaints.   Past Medical History:  Diagnosis Date  . Hypertension     There are no active problems to display for this patient.   Past Surgical History:  Procedure Laterality Date  . ABDOMINAL HYSTERECTOMY    . CHOLECYSTECTOMY      OB History    No data available       Home Medications    Prior to Admission medications   Medication Sig Start  Date End Date Taking? Authorizing Provider  amoxicillin (AMOXIL) 500 MG capsule Take 1 capsule by mouth 2 (two) times daily. 05/12/16  Yes Historical Provider, MD  aspirin-acetaminophen-caffeine (MIGRAINE RELIEF) 250-250-65 MG tablet Take 2 tablets by mouth every 6 (six) hours as needed for headache or migraine.   Yes Historical Provider, MD  bisoprolol-hydrochlorothiazide (ZIAC) 5-6.25 MG tablet Take 1 tablet by mouth daily. Patient not taking: Reported on 05/18/2016 12/17/15   Tatyana Kirichenko, PA-C  cephALEXin (KEFLEX) 500 MG capsule Take 1 capsule (500 mg total) by mouth 4 (four) times daily. Patient not taking: Reported on 12/17/2015 07/19/15   Tilden FossaElizabeth Rees, MD  ondansetron (ZOFRAN-ODT) 4 MG disintegrating tablet Take 1 tablet (4 mg total) by mouth every 8 (eight) hours as needed for nausea or vomiting. Patient not taking: Reported on 05/18/2016 12/17/15   Jaynie Crumbleatyana Kirichenko, PA-C  promethazine (PHENERGAN) 25 MG tablet Take 1 tablet (25 mg total) by mouth every 6 (six) hours as needed for nausea or vomiting. Patient not taking: Reported on 12/17/2015 07/19/15   Tilden FossaElizabeth Rees, MD    Family History No family history on file.  Social History Social History  Substance Use Topics  . Smoking status: Never Smoker  . Smokeless tobacco: Not on file  . Alcohol use No     Allergies   Codeine   Review of Systems Review of Systems  Eyes: Positive for photophobia and visual disturbance (Blurry).  Gastrointestinal: Positive for nausea. Negative for vomiting.  Neurological: Positive for headaches. Negative for weakness and numbness.  All other systems reviewed and are negative.  Physical Exam Updated Vital Signs BP (!) 169/105 (BP Location: Right Arm)   Pulse 100   Temp 98.6 F (37 C) (Oral)   Resp 18   Ht 5\' 6"  (1.676 m)   Wt 140 lb (63.5 kg)   SpO2 100%   BMI 22.60 kg/m   Physical Exam  Constitutional: She is oriented to person, place, and time. She appears well-developed  and well-nourished. No distress.  Nontoxic and in no distress  HENT:  Head: Normocephalic and atraumatic.  Mouth/Throat: Oropharynx is clear and moist.  Symmetric rise of the uvula with phonation  Eyes: Conjunctivae and EOM are normal. Pupils are equal, round, and reactive to light. No scleral icterus.  Neck: Normal range of motion.  No nuchal rigidity or meningismus  Cardiovascular: Normal rate, regular rhythm and intact distal pulses.   Pulmonary/Chest: Effort normal. No respiratory distress. She has no wheezes.  Respirations even and unlabored  Musculoskeletal: Normal range of motion.  Neurological: She is alert and oriented to person, place, and time. No cranial nerve deficit. She exhibits normal muscle tone. Coordination normal.  GCS 15. Speech is goal oriented. No cranial nerve deficits appreciated; symmetric eyebrow raise, no facial drooping, tongue midline. Patient has equal grip strength bilaterally with 5/5 strength against resistance in all major muscle groups bilaterally. Sensation to light touch intact. Patient moves extremities without ataxia. Normal heel-to-shin bilaterally. Patient ambulatory with steady gait.  Skin: Skin is warm and dry. No rash noted. She is not diaphoretic. No erythema. No pallor.  Psychiatric: She has a normal mood and affect. Her behavior is normal.  Nursing note and vitals reviewed.    ED Treatments / Results  DIAGNOSTIC STUDIES: Oxygen Saturation is 100% on RA, normal by my interpretation.    COORDINATION OF CARE: 10:49 PM- Pt advised of plan for treatment and pt agrees.  Labs (all labs ordered are listed, but only abnormal results are displayed) Labs Reviewed - No data to display  EKG  EKG Interpretation None       Radiology No results found.  Procedures Procedures (including critical care time)  Medications Ordered in ED Medications  ketorolac (TORADOL) 30 MG/ML injection 30 mg (30 mg Intravenous Given 05/18/16 2314)    metoCLOPramide (REGLAN) injection 10 mg (10 mg Intravenous Given 05/18/16 2315)  diphenhydrAMINE (BENADRYL) injection 12.5 mg (12.5 mg Intravenous Given 05/18/16 2320)     Initial Impression / Assessment and Plan / ED Course  I have reviewed the triage vital signs and the nursing notes.  Pertinent labs & imaging results that were available during my care of the patient were reviewed by me and considered in my medical decision making (see chart for details).  Clinical Course     50 year old female presents to the emergency department for evaluation of headache; hx of migraines. Neurologic exam nonfocal. No nuchal rigidity or meningismus. Patient has had improvement in her symptoms with a migraine cocktail. Doubt emergent etiology of symptoms. Will manage further on an outpatient basis. Return precautions discussed and provided. Patient discharged in stable condition with no unaddressed concerns.   Final Clinical Impressions(s) / ED Diagnoses   Final diagnoses:  Acute nonintractable headache, unspecified headache type    New Prescriptions New Prescriptions   No medications on file    I personally performed the services described in this documentation, which was scribed in my presence. The recorded information has been reviewed and is accurate.       Antony Madura, PA-C 05/19/16 Glena Norfolk    Lorin Picket  Deretha EmoryZackowski, MD 05/20/16 [phone]

## 2016-05-18 NOTE — ED Triage Notes (Signed)
Patient states that she has headache that started last night. Patient has blurred vision, light sensitivity and nausea with headache. Patient has HTN but not currently taking any medications for it.

## 2016-10-24 ENCOUNTER — Encounter (HOSPITAL_COMMUNITY): Payer: Self-pay | Admitting: Emergency Medicine

## 2016-10-24 ENCOUNTER — Emergency Department (HOSPITAL_COMMUNITY)
Admission: EM | Admit: 2016-10-24 | Discharge: 2016-10-24 | Disposition: A | Discharge status: Home / Self Care | Payer: 59 | Attending: Emergency Medicine | Admitting: Emergency Medicine

## 2016-10-24 DIAGNOSIS — Z79899 Other long term (current) drug therapy: Secondary | ICD-10-CM | POA: Diagnosis not present

## 2016-10-24 DIAGNOSIS — Z7982 Long term (current) use of aspirin: Secondary | ICD-10-CM | POA: Diagnosis not present

## 2016-10-24 DIAGNOSIS — R519 Headache, unspecified: Secondary | ICD-10-CM

## 2016-10-24 DIAGNOSIS — R51 Headache: Secondary | ICD-10-CM | POA: Insufficient documentation

## 2016-10-24 DIAGNOSIS — I10 Essential (primary) hypertension: Secondary | ICD-10-CM | POA: Diagnosis not present

## 2016-10-24 LAB — URINALYSIS, ROUTINE W REFLEX MICROSCOPIC
Bilirubin Urine: NEGATIVE
Glucose, UA: NEGATIVE mg/dL
Hgb urine dipstick: NEGATIVE
Ketones, ur: NEGATIVE mg/dL
Nitrite: NEGATIVE
Protein, ur: NEGATIVE mg/dL
Specific Gravity, Urine: 1.023 (ref 1.005–1.030)
pH: 6 (ref 5.0–8.0)

## 2016-10-24 MED ORDER — KETOROLAC TROMETHAMINE 15 MG/ML IJ SOLN
15.0000 mg | Freq: Once | INTRAMUSCULAR | Status: AC
  Administered 2016-10-24: 15 mg via INTRAVENOUS
  Filled 2016-10-24: qty 1

## 2016-10-24 MED ORDER — PROCHLORPERAZINE EDISYLATE 5 MG/ML IJ SOLN
10.0000 mg | Freq: Once | INTRAMUSCULAR | Status: AC
  Administered 2016-10-24: 10 mg via INTRAVENOUS
  Filled 2016-10-24: qty 2

## 2016-10-24 MED ORDER — SODIUM CHLORIDE 0.9 % IV BOLUS (SEPSIS)
1000.0000 mL | Freq: Once | INTRAVENOUS | Status: AC
  Administered 2016-10-24: 1000 mL via INTRAVENOUS

## 2016-10-24 MED ORDER — DIPHENHYDRAMINE HCL 50 MG/ML IJ SOLN
25.0000 mg | Freq: Once | INTRAMUSCULAR | Status: AC
  Administered 2016-10-24: 25 mg via INTRAVENOUS
  Filled 2016-10-24: qty 1

## 2016-10-24 NOTE — ED Triage Notes (Signed)
Pt reports band-like HA for the past 2 days accompanied by blurred vision. Usually takes OTC migraine relief, but is not completely resolving pain. Also complains of R flank pain, denies urinary symptoms.

## 2016-10-24 NOTE — ED Notes (Signed)
PT STATES SHE WAS DX WITH MIGRAINES HOWEVER DOES NOT SEE ANYONE FOR THIS DX. "IT WAS A LONG TIME AGO". PT HX OF HTN HOWEVER TAKES NO MEDICATION. PT STATES SHE TOOK HERSELF OFF THESE MEDS SEVERAL YEARS AGO. DENIES CHEST PAIN AND SOB

## 2016-10-24 NOTE — ED Notes (Signed)
ED Provider at bedside. 

## 2016-10-24 NOTE — ED Provider Notes (Signed)
[facility]-EMERGENCY DEPT Provider Note   CSN: [id number] Arrival date & time: 10/24/16  1237  By signing my name below, I, Annette Juran, attest that this documentation has been prepared under the direction and in the presence of Raeford Razor, MD. Electronically Signed: Doreatha George, ED Scribe. 10/24/16. 1:39 PM.     History   Chief Complaint Chief Complaint  Patient presents with  . Headache    HPI [name] is a 51 y.o. female with h/o migraines who presents to the Emergency Department complaining of moderate frontal HA that began a few days ago with associated left eye blurred vision, nausea, vomiting, photophobia. Pt states she normally takes OTC migraine medication with relief of her HA; however it has provided no relief of her current HA. She states her current HA is similar to prior migraines, but more severe. Pt also notes mild neck pain, but attributes this to sleeping in an odd position. She denies fever, neck stiffness, numbness, tingling, difficulty with coordination.    The history is provided by the patient. No language interpreter was used.    Past Medical History:  Diagnosis Date  . Hypertension     There are no active problems to display for this patient.   Past Surgical History:  Procedure Laterality Date  . ABDOMINAL HYSTERECTOMY    . CHOLECYSTECTOMY      OB History    No data available       Home Medications    Prior to Admission medications   Medication Sig Start Date End Date Taking? Authorizing Provider  amoxicillin (AMOXIL) 500 MG capsule Take 1 capsule by mouth 2 (two) times daily. 05/12/16   [provider]  aspirin-acetaminophen-caffeine (MIGRAINE RELIEF) 250-250-65 MG tablet Take 2 tablets by mouth every 6 (six) hours as needed for headache or migraine.    [provider]  bisoprolol-hydrochlorothiazide (ZIAC) 5-6.25 MG tablet Take 1 tablet by mouth daily. Patient not taking: Reported on 05/18/2016 12/17/15   Jaynie Crumble, PA-C  cephALEXin (KEFLEX) 500 MG capsule Take 1 capsule (500 mg total) by mouth 4 (four) times daily. Patient not taking: Reported on 12/17/2015 07/19/15   Tilden Fossa, MD  ondansetron (ZOFRAN-ODT) 4 MG disintegrating tablet Take 1 tablet (4 mg total) by mouth every 8 (eight) hours as needed for nausea or vomiting. Patient not taking: Reported on 05/18/2016 12/17/15   Jaynie Crumble, PA-C  promethazine (PHENERGAN) 25 MG tablet Take 1 tablet (25 mg total) by mouth every 6 (six) hours as needed for nausea or vomiting. Patient not taking: Reported on 12/17/2015 07/19/15   Tilden Fossa, MD    Family History History reviewed. No pertinent family history.  Social History Social History  Substance Use Topics  . Smoking status: Never Smoker  . Smokeless tobacco: Not on file  . Alcohol use No     Allergies   Codeine   Review of Systems Review of Systems  Constitutional: Negative for fever.  Eyes: Positive for photophobia and visual disturbance.  Gastrointestinal: Positive for nausea and vomiting.  Musculoskeletal: Positive for neck pain. Negative for gait problem and neck stiffness.  Neurological: Positive for headaches. Negative for numbness.  All other systems reviewed and are negative.    Physical Exam Updated Vital Signs BP (!) 166/98   Pulse (!) 112   Temp 98.3 F (36.8 C)   Resp 20   SpO2 99%   Physical Exam  Constitutional: She is oriented to person, place, and time. She appears well-developed and well-nourished. No  distress.  HENT:  Head: Normocephalic and atraumatic.  Mouth/Throat: Oropharynx is clear and moist. No oropharyngeal exudate.  Eyes: Conjunctivae and EOM are normal. Pupils are equal, round, and reactive to light. Right eye exhibits no discharge. Left eye exhibits no discharge. No scleral icterus.  Neck: Normal range of motion. Neck supple. No JVD present. No thyromegaly present.  No nuchal rigidity.   Cardiovascular: Normal rate, regular  rhythm, normal heart sounds and intact distal pulses.  Exam reveals no gallop and no friction rub.   No murmur heard. Pulmonary/Chest: Effort normal and breath sounds normal. No respiratory distress. She has no wheezes. She has no rales.  Abdominal: Soft. Bowel sounds are normal. She exhibits no distension and no mass. There is no tenderness.  Musculoskeletal: Normal range of motion. She exhibits no edema or tenderness.  Lymphadenopathy:    She has no cervical adenopathy.  Neurological: She is alert and oriented to person, place, and time. She displays normal reflexes. No cranial nerve deficit or sensory deficit. She exhibits normal muscle tone. Coordination normal.  Cranial nerves 2-12 grossly intact.   Skin: Skin is warm and dry. No rash noted. No erythema.  Psychiatric: She has a normal mood and affect. Her behavior is normal.  Nursing note and vitals reviewed.    ED Treatments / Results   DIAGNOSTIC STUDIES: Oxygen Saturation is 99% on RA, normal by my interpretation.    COORDINATION OF CARE: 1:37 PM Discussed treatment plan with pt at bedside which includes UA and pt agreed to plan.    Labs (all labs ordered are listed, but only abnormal results are displayed) Labs Reviewed  URINALYSIS, ROUTINE W REFLEX MICROSCOPIC    EKG  EKG Interpretation None       Radiology No results found.  Procedures Procedures (including critical care time)  Medications Ordered in ED Medications - No data to display   Initial Impression / Assessment and Plan / ED Course  I have reviewed the triage vital signs and the nursing notes.  Pertinent labs & imaging results that were available during my care of the patient were reviewed by me and considered in my medical decision making (see chart for details).     50yF with headache. Suspect primary HA. Consider emergent secondary causes such as bleed, infectious or mass but doubt. There is no history of trauma. Pt has a nonfocal  neurological exam. Afebrile and neck supple. No use of blood thinning medication. Consider ocular etiology such as acute angle closure glaucoma but doubt. Pt denies acute change in visual acuity and eye exam unremarkable. Doubt temporal arteritis given age, no temporal tenderness and temporal artery pulsations palpable. Doubt CO poisoning. No contacts with similar symptoms. Doubt venous thrombosis. Doubt carotid or vertebral arteries dissection. Symptoms improved with meds. Feel that can be safely discharged, but strict return precautions discussed. Outpt fu.   Final Clinical Impressions(s) / ED Diagnoses   Final diagnoses:  Nonintractable headache, unspecified chronicity pattern, unspecified headache type    New Prescriptions New Prescriptions   No medications on file    I personally preformed the services scribed in my presence. The recorded information has been reviewed is accurate. Raeford RazorStephen Andry Bogden, MD.    Raeford RazorKohut, Demeco Ducksworth, MD 10/31/16 1440

## 2017-01-17 ENCOUNTER — Other Ambulatory Visit: Payer: Self-pay | Admitting: Family Medicine

## 2017-01-20 ENCOUNTER — Other Ambulatory Visit: Payer: Self-pay | Admitting: Family Medicine

## 2017-01-20 DIAGNOSIS — Z1231 Encounter for screening mammogram for malignant neoplasm of breast: Secondary | ICD-10-CM

## 2017-11-27 IMAGING — CT CT RENAL STONE PROTOCOL
2 of 3 series · 16 of 40 positions shown, 18 images · non-contrast
Comparison: 07/26/2003 CT

CLINICAL DATA: 49-year-old female with right flank and abdominal
pain for 2 weeks, worsening for 1 day.

EXAM:
CT ABDOMEN AND PELVIS WITHOUT CONTRAST
TECHNIQUE: Multidetector CT imaging of the abdomen and pelvis was performed
following the standard protocol without IV contrast.

[Series 4: lung · axial · 0.68mm/px · z∈[-181,-66]mm · 13 of 26 slices shown, 15 images]
[im 2/26  soft-tissue]
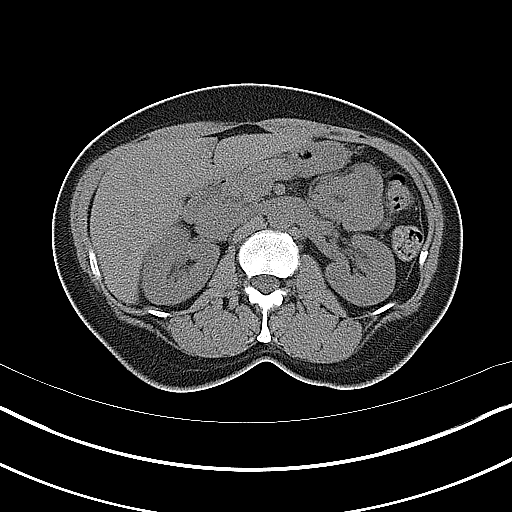
[im 2/26  bone]
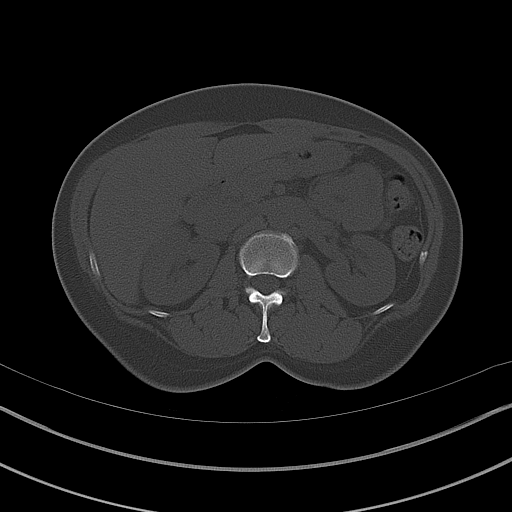
[im 4/26  soft-tissue]
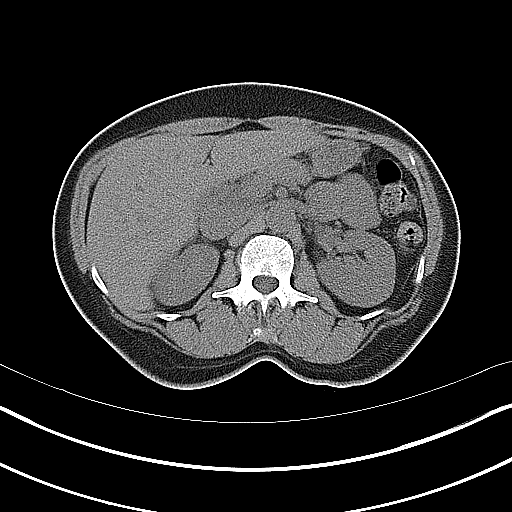
[im 6/26  soft-tissue]
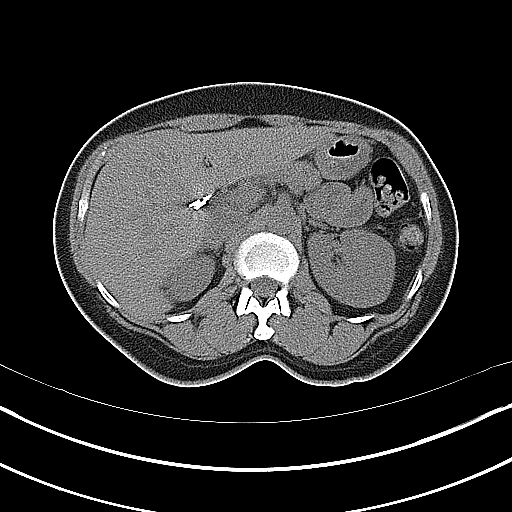
[im 8/26  soft-tissue]
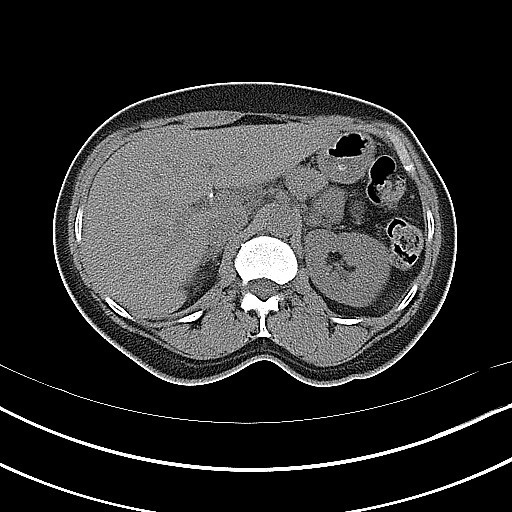
[im 10/26  soft-tissue]
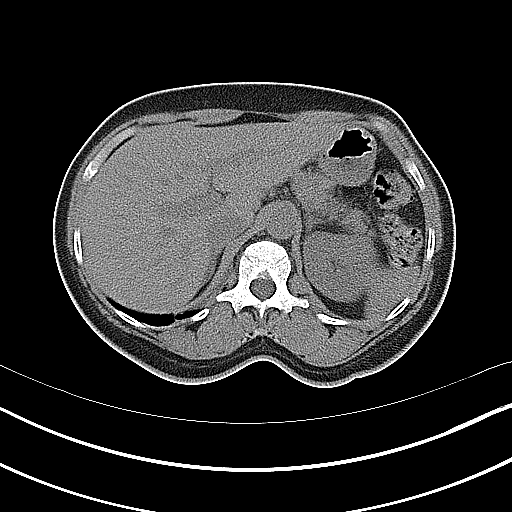
[im 12/26  soft-tissue]
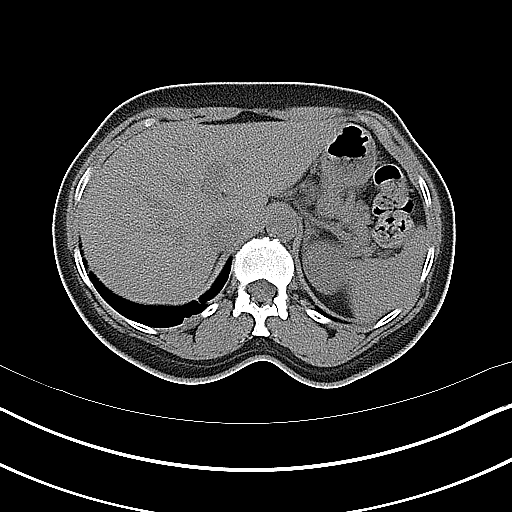
[im 14/26  soft-tissue]
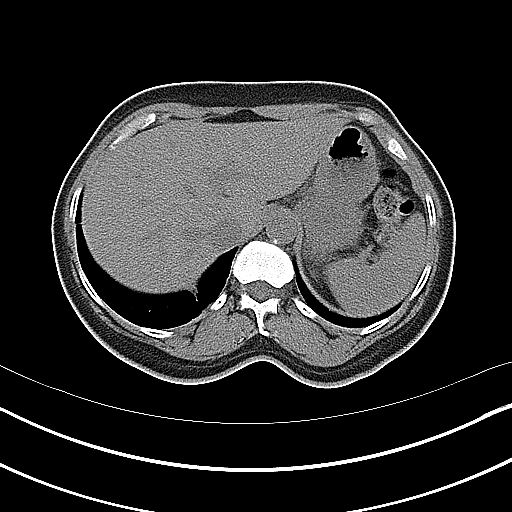
[im 15/26  soft-tissue]
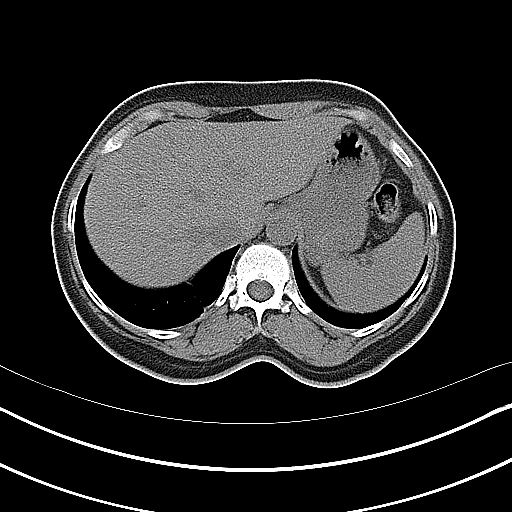
[im 17/26  soft-tissue]
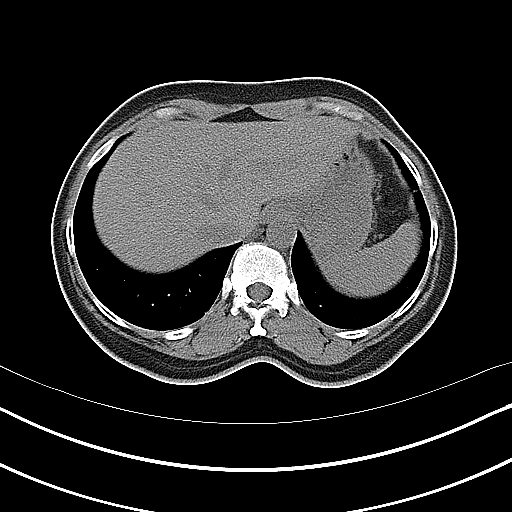
[im 17/26  bone]
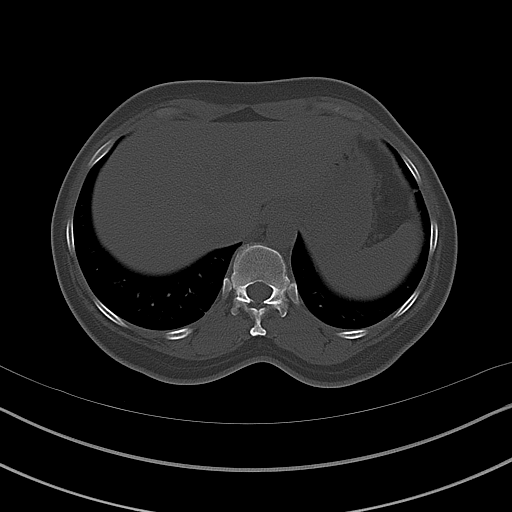
[im 19/26  soft-tissue]
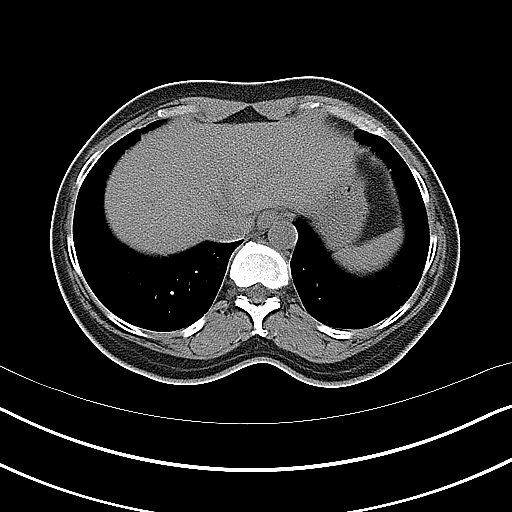
[im 21/26  soft-tissue]
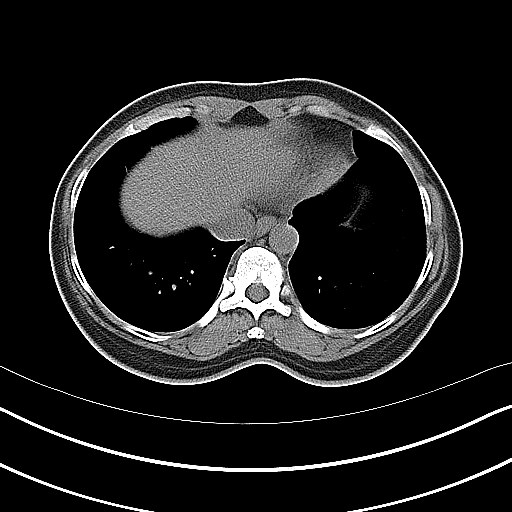
[im 23/26  soft-tissue]
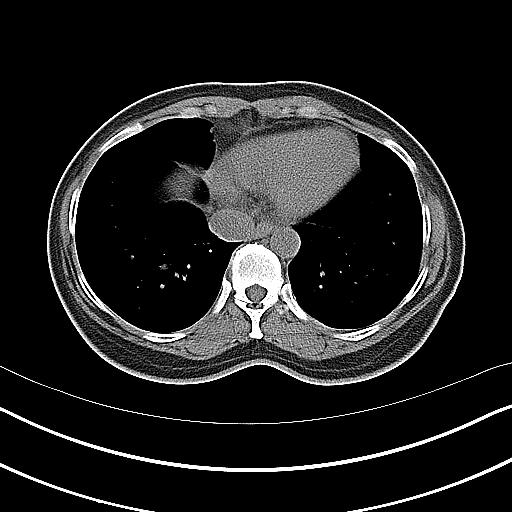
[im 25/26  soft-tissue]
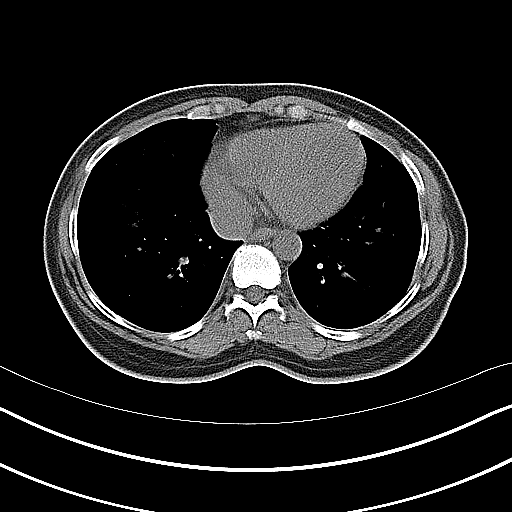

[Series 5: coronal · coronal · 0.65mm/px · 3 of 81 slices shown]
[im 27/81  soft-tissue]
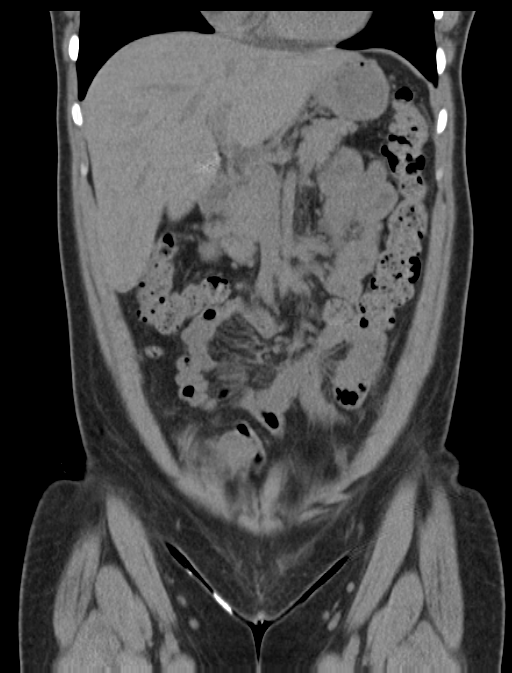
[im 36/81  soft-tissue]
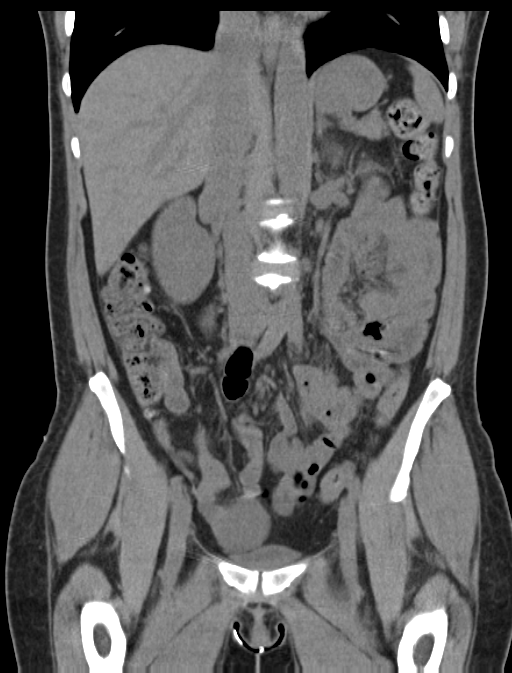
[im 45/81  soft-tissue]
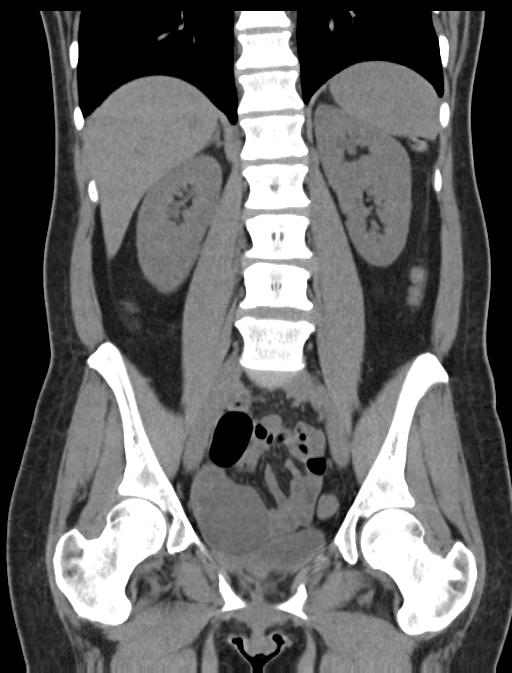

[16 of 40 positions shown; findings below may reference images not displayed]

FINDINGS: Please note that parenchymal abnormalities may be missed without
intravenous contrast.

Lower chest:  Minimal right basilar scarring again noted.

Hepatobiliary: The liver is unremarkable. The patient is status post
cholecystectomy. There is no evidence of biliary dilatation.

Pancreas: Unremarkable

Spleen: Unremarkable

Adrenals/Urinary Tract: The kidneys, adrenal glands and bladder are
unremarkable. There is no evidence of hydronephrosis or urinary
calculi.

Stomach/Bowel: Unremarkable. No evidence of bowel obstruction or
definite bowel wall thickening. The appendix is normal.

Vascular/Lymphatic: No enlarged lymph nodes or abdominal aortic
aneurysm.

Reproductive: A 4.5 x 6 cm cystic structure in the right adnexal
region may represent an ovarian cyst. The patient is status post
hysterectomy. There is no evidence of left adnexal mass.

Other: No free fluid, focal collection or pneumoperitoneum.

Musculoskeletal: No acute or suspicious abnormality.
IMPRESSION: 4.5 x 6 cm probable right adnexal/ovarian cyst. No evidence of free
fluid. Pelvic ultrasound is recommended for further evaluation.

No other acute or significant abnormalities identified.

## 2018-06-28 ENCOUNTER — Encounter (HOSPITAL_COMMUNITY): Payer: Self-pay | Admitting: Emergency Medicine

## 2018-06-28 ENCOUNTER — Emergency Department (HOSPITAL_COMMUNITY)
Admission: EM | Admit: 2018-06-28 | Discharge: 2018-06-28 | Disposition: A | Discharge status: Home / Self Care | Payer: BLUE CROSS/BLUE SHIELD | Attending: Emergency Medicine | Admitting: Emergency Medicine

## 2018-06-28 DIAGNOSIS — R69 Illness, unspecified: Secondary | ICD-10-CM

## 2018-06-28 DIAGNOSIS — J111 Influenza due to unidentified influenza virus with other respiratory manifestations: Secondary | ICD-10-CM | POA: Insufficient documentation

## 2018-06-28 DIAGNOSIS — I10 Essential (primary) hypertension: Secondary | ICD-10-CM | POA: Insufficient documentation

## 2018-06-28 MED ORDER — ONDANSETRON 4 MG PO TBDP: 4.0000 mg | ORAL_TABLET | Freq: Three times a day (TID) | ORAL | 0 refills | Status: DC | PRN

## 2018-06-28 MED ORDER — ONDANSETRON 4 MG PO TBDP
4.0000 mg | ORAL_TABLET | Freq: Once | ORAL | Status: AC
  Administered 2018-06-28: 4 mg via ORAL
  Filled 2018-06-28: qty 1

## 2018-06-28 MED ORDER — ACETAMINOPHEN 500 MG PO TABS
1000.0000 mg | ORAL_TABLET | Freq: Once | ORAL | Status: AC
  Administered 2018-06-28: 1000 mg via ORAL
  Filled 2018-06-28: qty 2

## 2018-06-28 NOTE — ED Triage Notes (Signed)
Pt c/o sore throat, no appetite, headache for 2 days. Reports been around sick grandbabies.

## 2018-06-28 NOTE — Discharge Instructions (Signed)
Today your symptoms are consistent with a flu or a flulike illness.  As I did not test you for the flu we call this a flulike illness.  I have given you information to read on the flu as most of this still applies.  If you have not already, please consider getting a flu shot once you are well.  Please make sure to practice good hand hygiene to help prevent the spread of flu.  We discussed today that many symptoms of the flu such as fever, not feeling well, and body aches can also be the first signs of more serious illnesses or infections.  If you have any new or worsening symptoms please seek additional medical care and evaluation.    Please take Ibuprofen (Advil, motrin) and Tylenol (acetaminophen) to relieve your pain.  You may take up to 600 MG (3 pills) of normal strength ibuprofen every 8 hours as needed.  In between doses of ibuprofen you make take tylenol, up to 1,000 mg (two extra strength pills).  Do not take more than 3,000 mg tylenol in a 24 hour period.  Please check all medication labels as many medications such as pain and cold medications may contain tylenol.  Do not drink alcohol while taking these medications.  Do not take other NSAID'S while taking ibuprofen (such as aleve or naproxen).  Please take ibuprofen with food to decrease stomach upset.

## 2018-06-28 NOTE — ED Provider Notes (Signed)
[facility]-EMERGENCY DEPT Provider Note   CSN: [id number] Arrival date & time: 06/28/18  1249     History   Chief Complaint Chief Complaint  Patient presents with  . Sore Throat  . no appetite  . Headache    HPI [name] is a 53 y.o. female With a past medical history of HTN who presents today for evaluation of not feeling well for 2 days.  She reports that her grandchildren were diagnosed with the flu last week.    She has been taking ibuprofen, last dose yesterday.    She reports nausea with vomiting 3x today.   She reports that she developed mild abdominal pain after vomiting.   Did not get a flu shot.    HPI  Past Medical History:  Diagnosis Date  . Hypertension     There are no active problems to display for this patient.   Past Surgical History:  Procedure Laterality Date  . ABDOMINAL HYSTERECTOMY    . CHOLECYSTECTOMY       OB History   No obstetric history on file.      Home Medications    Prior to Admission medications   Medication Sig Start Date End Date Taking? Authorizing Provider  acetaminophen (TYLENOL) 500 MG tablet Take 1,000 mg by mouth every 6 (six) hours as needed for mild pain.   Yes [provider]  aspirin-acetaminophen-caffeine (MIGRAINE RELIEF) 250-250-65 MG tablet Take 2 tablets by mouth every 6 (six) hours as needed for headache or migraine.   Yes [provider]  ibuprofen (ADVIL,MOTRIN) 200 MG tablet Take 400 mg by mouth every 6 (six) hours as needed for mild pain.   Yes [provider]  Melatonin 3 MG TABS Take 3 mg by mouth as needed (sleep).   Yes [provider]  Multiple Vitamins-Minerals (ONE-A-DAY WOMENS 50 PLUS) TABS Take 1 tablet by mouth 3 (three) times a week.   Yes [provider]  ondansetron (ZOFRAN ODT) 4 MG disintegrating tablet Take 1 tablet (4 mg total) by mouth every 8 (eight) hours as needed for nausea or vomiting. 06/28/18   Cristina Gong, PA-C    Family History No family history on file.  Social History Social History   Tobacco Use  . Smoking status: Never Smoker  . Smokeless tobacco: Never Used  Substance Use Topics  . Alcohol use: No  . Drug use: No     Allergies   Codeine   Review of Systems Review of Systems  Constitutional: Positive for fatigue and fever. Negative for chills.  HENT: Positive for congestion, postnasal drip, rhinorrhea and sore throat. Negative for ear pain, sinus pressure, sinus pain and trouble swallowing.   Eyes: Negative for visual disturbance.  Respiratory: Positive for cough. Negative for chest tightness and shortness of breath.   Cardiovascular: Negative for chest pain (Occasional, after coughing started).  Gastrointestinal: Positive for nausea and vomiting. Negative for abdominal pain and diarrhea.  Genitourinary: Negative for dysuria.  Musculoskeletal: Negative for back pain and neck pain.  Skin: Negative for rash.  Neurological: Positive for headaches. Negative for weakness.  Psychiatric/Behavioral: Negative for confusion.  All other systems reviewed and are negative.    Physical Exam Updated Vital Signs BP (!) 157/85 (BP Location: Right Arm)   Pulse 99   Temp 99.1 F (37.3 C) (Oral)   Resp 17   SpO2 99%   Physical Exam Vitals signs and nursing note reviewed.  Constitutional:  General: She is not in acute distress.    Appearance: She is normal weight. She is not ill-appearing.  HENT:     Head: Normocephalic and atraumatic.     Right Ear: Tympanic membrane and ear canal normal. No middle ear effusion. Tympanic membrane is not erythematous.     Left Ear: Tympanic membrane and ear canal normal.  No middle ear effusion. Tympanic membrane is not erythematous.     Nose: No congestion.     Mouth/Throat:     Mouth: Mucous membranes are moist. No oral lesions.     Pharynx: Uvula midline.     Tonsils: No tonsillar exudate or tonsillar abscesses.  Swelling: 0 on the right. 0 on the left.  Eyes:     Conjunctiva/sclera: Conjunctivae normal.     Pupils: Pupils are equal, round, and reactive to light.  Neck:     Musculoskeletal: Normal range of motion and neck supple.  Cardiovascular:     Rate and Rhythm: Normal rate and regular rhythm.  Pulmonary:     Effort: Pulmonary effort is normal. No respiratory distress.     Breath sounds: Normal breath sounds. No wheezing or rales.  Abdominal:     General: Bowel sounds are normal. There is no distension.     Palpations: Abdomen is soft. There is no mass.     Tenderness: There is no abdominal tenderness. There is no guarding or rebound.  Lymphadenopathy:     Cervical: No cervical adenopathy.  Skin:    General: Skin is warm and dry.  Neurological:     General: No focal deficit present.     Mental Status: She is alert and oriented to person, place, and time.  Psychiatric:        Mood and Affect: Mood normal.        Behavior: Behavior normal.      ED Treatments / Results  Labs (all labs ordered are listed, but only abnormal results are displayed) Labs Reviewed - No data to display  EKG None  Radiology No results found.  Procedures Procedures (including critical care time)  Medications Ordered in ED Medications  ondansetron (ZOFRAN-ODT) disintegrating tablet 4 mg (4 mg Oral Given 06/28/18 1719)  acetaminophen (TYLENOL) tablet 1,000 mg (1,000 mg Oral Given 06/28/18 1719)     Initial Impression / Assessment and Plan / ED Course  I have reviewed the triage vital signs and the nursing notes.  Pertinent labs & imaging results that were available during my care of the patient were reviewed by me and considered in my medical decision making (see chart for details).  Clinical Course as of Jun 28 1844  Wed Jun 28, 2018  1840 Patient reevaluated, she has passed p.o. challenge and is requesting discharge home.   [EH]    Clinical Course User Index [EH] Cristina GongHammond, Dinita Migliaccio W, PA-C    Patient with symptoms consistent with influenza.  Vitals are stable, low-grade fever.  No signs of dehydration, tolerating PO's after Zofran ODT.  Lungs are clear. Due to patient's presentation and physical exam a chest x-ray was not ordered bc likely diagnosis of flu.  Discussed the cost versus benefit of Tamiflu treatment with the patient.  The patient understands that symptoms are greater than the recommended 24-48 hour window of treatment.  Patient did report mild abdominal pain after vomiting.  On my exam her abdomen was nontender without evidence of peritoneal signs, not consistent with acute abdomen.    I did offer her labs, additional evaluation  which she declined after we discussed risks of this.  Patient will be discharged with instructions to orally hydrate, rest, and use over-the-counter medications such as anti-inflammatories ibuprofen and Aleve for muscle aches and Tylenol for fever.  She is given a prescription for Zofran.    Final Clinical Impressions(s) / ED Diagnoses   Final diagnoses:  Influenza-like illness  Essential hypertension    ED Discharge Orders         Ordered    ondansetron (ZOFRAN ODT) 4 MG disintegrating tablet  Every 8 hours PRN     06/28/18 1844           Norman Clay 06/28/18 Clare Charon, MD 06/28/18 2340

## 2019-02-09 ENCOUNTER — Ambulatory Visit (HOSPITAL_COMMUNITY)
Admission: EM | Admit: 2019-02-09 | Discharge: 2019-02-09 | Disposition: A | Discharge status: Home / Self Care | Payer: Managed Care, Other (non HMO) | Attending: Emergency Medicine | Admitting: Emergency Medicine

## 2019-02-09 ENCOUNTER — Encounter (HOSPITAL_COMMUNITY): Payer: Self-pay | Admitting: Emergency Medicine

## 2019-02-09 DIAGNOSIS — M25552 Pain in left hip: Secondary | ICD-10-CM

## 2019-02-09 DIAGNOSIS — I10 Essential (primary) hypertension: Secondary | ICD-10-CM

## 2019-02-09 MED ORDER — IBUPROFEN 800 MG PO TABS: 800.0000 mg | ORAL_TABLET | Freq: Three times a day (TID) | ORAL | 0 refills | Status: DC | PRN

## 2019-02-09 MED ORDER — CYCLOBENZAPRINE HCL 10 MG PO TABS: 10.0000 mg | ORAL_TABLET | Freq: Two times a day (BID) | ORAL | 0 refills | Status: DC | PRN

## 2019-02-09 NOTE — Discharge Instructions (Addendum)
Take the prescribed ibuprofen as needed for discomfort.  Take the muscle relaxer Flexeril as needed for muscle spasm; this medication may cause you to be drowsy so do not drive, operate machinery, or drink alcohol with it.    Return here or follow-up with the orthopedist listed below if your pain continues or worsens.    Return here if you develop other symptoms such as fever, chills, abdominal pain, back pain, difficulty with urination, pelvic pain.    Your blood pressure is elevated today at  161/86.  Please have this rechecked by your primary care provider in 2-4 weeks.  If you do not have a primary care provider, one is suggested below.

## 2019-02-09 NOTE — ED Triage Notes (Signed)
Pt states for the last week shes had L sided hip pain that travels down her leg, states she does a lot of heavy lifting and thinks she irritated the side of her leg and back. C/o burning sensation.

## 2019-02-09 NOTE — ED Provider Notes (Signed)
[facility]    CSN: [id number] Arrival date & time: 02/09/19  1243      History   Chief Complaint Chief Complaint  Patient presents with  . Hip Pain    HPI [name] is a 53 y.o. female.   Patient presents with left lateral hip and thigh pain x 1 week.  She describes the pain as "burning" and rates at 8/10.  She has taken ibuprofen with moderate relief.  She states the pain is worse with rotation of her hip and movement of her thigh; and improves with rest.  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  She denies weakness, saddle anesthesia, bowel/bladder incontinence, abdominal pain, back pain, dysuria, pelvic pain, fever, chills, or other symptoms.     The history is provided by the patient.    Past Medical History:  Diagnosis Date  . Hypertension     There are no active problems to display for this patient.   Past Surgical History:  Procedure Laterality Date  . ABDOMINAL HYSTERECTOMY    . CHOLECYSTECTOMY      OB History   No obstetric history on file.      Home Medications    Prior to Admission medications   Medication Sig Start Date End Date Taking? Authorizing Provider  acetaminophen (TYLENOL) 500 MG tablet Take 1,000 mg by mouth every 6 (six) hours as needed for mild pain.    [provider]  aspirin-acetaminophen-caffeine (MIGRAINE RELIEF) [phone] MG tablet Take 2 tablets by mouth every 6 (six) hours as needed for headache or migraine.    [provider]  cyclobenzaprine (FLEXERIL) 10 MG tablet Take 1 tablet (10 mg total) by mouth 2 (two) times daily as needed for muscle spasms. 02/09/19   Mickie Bailate, Dejana Pugsley H, NP  ibuprofen (ADVIL) 800 MG tablet Take 1 tablet (800 mg total) by mouth every 8 (eight) hours as needed. 02/09/19   Mickie Bailate, Makalah Asberry H, NP  Melatonin 3 MG TABS Take 3 mg by mouth as  needed (sleep).    [provider]  Multiple Vitamins-Minerals (ONE-A-DAY WOMENS 50 PLUS) TABS Take 1 tablet by mouth 3 (three) times a week.    [provider]  ondansetron (ZOFRAN ODT) 4 MG disintegrating tablet Take 1 tablet (4 mg total) by mouth every 8 (eight) hours as needed for nausea or vomiting. 06/28/18   Cristina GongHammond, Elizabeth W, PA-C    Family History No family history on file.  Social History Social History   Tobacco Use  . Smoking status: Never Smoker  . Smokeless tobacco: Never Used  Substance Use Topics  . Alcohol use: No  . Drug use: No     Allergies   Codeine   Review of Systems Review of Systems  Constitutional: Negative for chills and fever.  HENT: Negative for ear pain and sore throat.   Eyes: Negative for pain and visual disturbance.  Respiratory: Negative for cough and shortness of breath.   Cardiovascular: Negative for chest pain and palpitations.  Gastrointestinal: Negative for abdominal pain and vomiting.  Genitourinary: Negative for dysuria and hematuria.  Musculoskeletal: Positive for arthralgias and myalgias. Negative for back pain.  Skin: Negative for color change and rash.  Neurological: Negative for dizziness, seizures, syncope, speech difficulty, weakness, numbness and headaches.  All other systems reviewed and are negative.    Physical  Exam Triage Vital Signs ED Triage Vitals  Enc Vitals Group     BP 02/09/19 1302 (!) 161/86     Pulse Rate 02/09/19 1302 100     Resp 02/09/19 1302 18     Temp 02/09/19 1302 98 F (36.7 C)     Temp src --      SpO2 02/09/19 1302 100 %     Weight --      Height --      Head Circumference --      Peak Flow --      Pain Score 02/09/19 1303 8     Pain Loc --      Pain Edu? --      Excl. in GC? --    No data found.  Updated Vital Signs BP (!) 161/86   Pulse 100   Temp 98 F (36.7 C)   Resp 18   SpO2 100%   Visual Acuity Right Eye Distance:   Left Eye Distance:   Bilateral  Distance:    Right Eye Near:   Left Eye Near:    Bilateral Near:     Physical Exam Vitals signs and nursing note reviewed.  Constitutional:      General: She is not in acute distress.    Appearance: She is well-developed.  HENT:     Head: Normocephalic and atraumatic.  Eyes:     Conjunctiva/sclera: Conjunctivae normal.  Neck:     Musculoskeletal: Neck supple.  Cardiovascular:     Rate and Rhythm: Normal rate and regular rhythm.     Heart sounds: No murmur.  Pulmonary:     Effort: Pulmonary effort is normal. No respiratory distress.     Breath sounds: Normal breath sounds.  Abdominal:     General: Bowel sounds are normal.     Palpations: Abdomen is soft.     Tenderness: There is no abdominal tenderness. There is no right CVA tenderness, left CVA tenderness, guarding or rebound.  Musculoskeletal: Normal range of motion.        General: No swelling, tenderness, deformity or signs of injury.     Right lower leg: No edema.     Left lower leg: No edema.  Skin:    General: Skin is warm and dry.     Findings: No bruising, erythema, lesion or rash.  Neurological:     General: No focal deficit present.     Mental Status: She is alert and oriented to person, place, and time.     Cranial Nerves: No cranial nerve deficit.     Sensory: No sensory deficit.     Motor: No weakness.     Coordination: Coordination normal.     Gait: Gait normal.     Deep Tendon Reflexes: Reflexes normal.  Psychiatric:        Mood and Affect: Mood normal.        Behavior: Behavior normal.      UC Treatments / Results  Labs (all labs ordered are listed, but only abnormal results are displayed) Labs Reviewed - No data to display  EKG   Radiology No results found.  Procedures Procedures (including critical care time)  Medications Ordered in UC Medications - No data to display  Initial Impression / Assessment and Plan / UC Course  I have reviewed the triage vital signs and the nursing  notes.  Pertinent labs & imaging results that were available during my care of the patient were reviewed by me and considered  in my medical decision making (see chart for details).   Left hip pain.  Elevated blood pressure with known diagnosis of hypertension.  Treating with ibuprofen and Flexeril; precautions given to patient for taking Flexeril.  Instructed patient to return here or follow-up with an orthopedist if her pain continues or worsens.  Discussed with patient that she should follow up with the orthopedist or return here if she has weakness, numbness, paresthesias, or loss of bowel/bladder control.  Instructed patient to return here if she develops other symptoms such as fever, chills, abdominal pain, back pain, dysuria, pelvic pain.  Discussed with patient that her blood pressure is elevated today.  She states she has hypertension and used to be on medication, which she stopped taking.  Instructed patient to have her blood pressure rechecked by her PCP in 2 to 4 weeks; and suggestion given for PCP if she does not have one.  Patient agrees with treatment plan.     Final Clinical Impressions(s) / UC Diagnoses   Final diagnoses:  Acute pain of left hip  Elevated blood pressure reading in office with diagnosis of hypertension     Discharge Instructions     Take the prescribed ibuprofen as needed for discomfort.  Take the muscle relaxer Flexeril as needed for muscle spasm; this medication may cause you to be drowsy so do not drive, operate machinery, or drink alcohol with it.    Return here or follow-up with the orthopedist listed below if your pain continues or worsens.    Return here if you develop other symptoms such as fever, chills, abdominal pain, back pain, difficulty with urination, pelvic pain.    Your blood pressure is elevated today at  161/86.  Please have this rechecked by your primary care provider in 2-4 weeks.  If you do not have a primary care provider, one is suggested  below.         ED Prescriptions    Medication Sig Dispense Auth. Provider   ibuprofen (ADVIL) 800 MG tablet Take 1 tablet (800 mg total) by mouth every 8 (eight) hours as needed. 21 tablet Sharion Balloon, NP   cyclobenzaprine (FLEXERIL) 10 MG tablet Take 1 tablet (10 mg total) by mouth 2 (two) times daily as needed for muscle spasms. 20 tablet Sharion Balloon, NP     Controlled Substance Prescriptions [place] Controlled Substance Registry consulted? Yes, I have consulted the  Controlled Substances Registry for this patient, and feel the risk/benefit ratio today is favorable for proceeding with this prescription for a controlled substance.   Sharion Balloon, NP 02/09/19 [phone]

## 2019-10-23 ENCOUNTER — Encounter (HOSPITAL_COMMUNITY): Payer: Self-pay

## 2019-10-23 ENCOUNTER — Ambulatory Visit (HOSPITAL_COMMUNITY)
Admission: EM | Admit: 2019-10-23 | Discharge: 2019-10-23 | Disposition: A | Discharge status: Home / Self Care | Payer: 59 | Attending: Physician Assistant | Admitting: Physician Assistant

## 2019-10-23 DIAGNOSIS — I10 Essential (primary) hypertension: Secondary | ICD-10-CM | POA: Diagnosis not present

## 2019-10-23 DIAGNOSIS — G43809 Other migraine, not intractable, without status migrainosus: Secondary | ICD-10-CM

## 2019-10-23 HISTORY — DX: Migraine, unspecified, not intractable, without status migrainosus: G43.909

## 2019-10-23 MED ORDER — SUMATRIPTAN SUCCINATE 6 MG/0.5ML ~~LOC~~ SOLN
6.0000 mg | Freq: Once | SUBCUTANEOUS | Status: AC
  Administered 2019-10-23: 10:00:00 6 mg via SUBCUTANEOUS

## 2019-10-23 MED ORDER — KETOROLAC TROMETHAMINE 30 MG/ML IJ SOLN
30.0000 mg | Freq: Once | INTRAMUSCULAR | Status: AC
  Administered 2019-10-23: 10:00:00 30 mg via INTRAMUSCULAR

## 2019-10-23 MED ORDER — SUMATRIPTAN SUCCINATE 6 MG/0.5ML ~~LOC~~ SOLN
SUBCUTANEOUS | Status: AC
  Filled 2019-10-23: qty 0.5

## 2019-10-23 MED ORDER — KETOROLAC TROMETHAMINE 30 MG/ML IJ SOLN
INTRAMUSCULAR | Status: AC
  Filled 2019-10-23: qty 1

## 2019-10-23 NOTE — ED Triage Notes (Signed)
Pt c/o 8/10 throbbing pain in left side of headx1.5wks. PT tried OTC eccedrin w/o relief. Pt denies blurred vision or vision changes, but does have nausea.

## 2019-10-23 NOTE — ED Provider Notes (Signed)
[facility]    CSN: [id number] Arrival date & time: 10/23/19  2458      History   Chief Complaint Chief Complaint  Patient presents with  . Migraine    HPI [name] is a 54 y.o. female.   Patient with known history of migraines and hypertension presents to urgent care for several days duration of migraine headache.  She reports headache has been persistent over the last 7 to 10 days.  She does report 1 day where she did not have a headache at all.  She reports over this duration headache is generally responded to Excedrin however the last 2 days the headache is not let off is much.  She reports it is up to an 8/10.  Currently she has a throbbing sensation over the left side of her head.  Denies any visual changes or blurry vision.  She does endorse a little bit of photophobia and phonophobia.  Denies red eye or eye pain.  She endorses nausea but no vomiting.  Denies any numbness tingling or weakness.  She reports other than change in response to Excedrin this is a consistent migraine with her previous.  She reports she was previously treated for hypertension several years ago and followed in a migraine clinic.  However she has not had follow-up on those things in quite some time.  She is currently denying any chest pain, shortness of breath.  Denies cold-like symptoms, fever or chills.  No reported sweating on the left side of her head or temple pain.  She reports she completed her Moderna Covid vaccine 2 weeks ago.    She would not classify this is the worst headache of her life.     Past Medical History:  Diagnosis Date  . Hypertension   . Migraines     There are no problems to display for this patient.   Past Surgical History:  Procedure Laterality Date  . ABDOMINAL HYSTERECTOMY    . CHOLECYSTECTOMY      OB History   No obstetric history on file.      Home Medications    Prior to Admission medications   Medication Sig Start Date End Date  Taking? Authorizing Provider  acetaminophen (TYLENOL) 500 MG tablet Take 1,000 mg by mouth every 6 (six) hours as needed for mild pain.    [provider]  aspirin-acetaminophen-caffeine (MIGRAINE RELIEF) [phone] MG tablet Take 2 tablets by mouth every 6 (six) hours as needed for headache or migraine.    [provider]  cyclobenzaprine (FLEXERIL) 10 MG tablet Take 1 tablet (10 mg total) by mouth 2 (two) times daily as needed for muscle spasms. 02/09/19   Mickie Bail, NP  ibuprofen (ADVIL) 800 MG tablet Take 1 tablet (800 mg total) by mouth every 8 (eight) hours as needed. 02/09/19   Mickie Bail, NP  Melatonin 3 MG TABS Take 3 mg by mouth as needed (sleep).    [provider]  Multiple Vitamins-Minerals (ONE-A-DAY WOMENS 50 PLUS) TABS Take 1 tablet by mouth 3 (three) times a week.    [provider]  ondansetron (ZOFRAN ODT) 4 MG disintegrating tablet Take 1 tablet (4 mg total) by mouth every 8 (eight) hours as needed for nausea or vomiting. 06/28/18   Cristina Gong, PA-C    Family History Family History  Problem Relation Age of Onset  . Hypertension Mother   . Hypertension Father     Social History Social History  Tobacco Use  . Smoking status: Never Smoker  . Smokeless tobacco: Never Used  Substance Use Topics  . Alcohol use: No  . Drug use: No     Allergies   Codeine   Review of Systems Review of Systems  Per HPI Physical Exam Triage Vital Signs ED Triage Vitals  Enc Vitals Group     BP 10/23/19 0853 (!) 167/70     Pulse Rate 10/23/19 0853 (!) 108     Resp 10/23/19 0853 16     Temp 10/23/19 0853 98 F (36.7 C)     Temp Source 10/23/19 0853 Oral     SpO2 10/23/19 0853 98 %     Weight 10/23/19 0854 125 lb (56.7 kg)     Height 10/23/19 0854 5\' 6"  (1.676 m)     Head Circumference --      Peak Flow --      Pain Score 10/23/19 0854 8     Pain Loc --      Pain Edu? --      Excl. in GC? --    No data found.  Updated  Vital Signs BP (!) 167/70   Pulse (!) 108   Temp 98 F (36.7 C) (Oral)   Resp 16   Ht 5\' 6"  (1.676 m)   Wt 125 lb (56.7 kg)   SpO2 98%   BMI 20.18 kg/m   Visual Acuity Right Eye Distance:   Left Eye Distance:   Bilateral Distance:    Right Eye Near:   Left Eye Near:    Bilateral Near:     Physical Exam Vitals and nursing note reviewed.  Constitutional:      General: She is not in acute distress.    Appearance: Normal appearance. She is well-developed. She is not ill-appearing.  HENT:     Head: Normocephalic and atraumatic.     Comments: No palpable vessels in the temple region.  No tenderness over this region.    Nose: Nose normal.     Mouth/Throat:     Mouth: Mucous membranes are moist.     Pharynx: Oropharynx is clear.  Eyes:     Conjunctiva/sclera: Conjunctivae normal.  Cardiovascular:     Rate and Rhythm: Normal rate and regular rhythm.     Heart sounds: No murmur.  Pulmonary:     Effort: Pulmonary effort is normal. No respiratory distress.     Breath sounds: Normal breath sounds.  Abdominal:     Palpations: Abdomen is soft.     Tenderness: There is no abdominal tenderness.  Musculoskeletal:     Cervical back: Neck supple. No rigidity or tenderness.     Right lower leg: No edema.     Left lower leg: No edema.  Skin:    General: Skin is warm and dry.     Capillary Refill: Capillary refill takes less than 2 seconds.     Findings: No rash.  Neurological:     General: No focal deficit present.     Mental Status: She is alert and oriented to person, place, and time.     Cranial Nerves: No cranial nerve deficit.     Sensory: No sensory deficit.     Motor: No weakness.     Coordination: Coordination normal.     Gait: Gait normal.      UC Treatments / Results  Labs (all labs ordered are listed, but only abnormal results are displayed) Labs Reviewed - No data to display  EKG  Radiology No results found.  Procedures Procedures (including critical  care time)  Medications Ordered in UC Medications  ketorolac (TORADOL) 30 MG/ML injection 30 mg (30 mg Intramuscular Given 10/23/19 1013)  SUMAtriptan (IMITREX) injection 6 mg (6 mg Subcutaneous Given 10/23/19 1013)    Initial Impression / Assessment and Plan / UC Course  I have reviewed the triage vital signs and the nursing notes.  Pertinent labs & imaging results that were available during my care of the patient were reviewed by me and considered in my medical decision making (see chart for details).     #Migraine #Elevated blood pressure Patient is a 54 year old female with known history of migraine reporting with a migraine consistent with her previous.  She is neurologically well without evidence of focal deficit.  We will treat her with Toradol and Imitrex in clinic.  Patient to follow-up with primary care for further management migraines.  Blood pressure is elevated however will defer initiation of treatment to primary care at this time.  Patient verbalized agreement with this plan.  Primary care resources were given.  Strict return and emergency department precautions were discussed with patient.  Final Clinical Impressions(s) / UC Diagnoses   Final diagnoses:  Other migraine without status migrainosus, not intractable  Elevated blood pressure reading in office with diagnosis of hypertension     Discharge Instructions     Continue the Excedrin if you find that this helps with your headaches.  Take this at the first sign of the headache.  You may also consider taking 1-2 Benadryl at the first sign of the headache as long as you can sleep and have 8 hours prior to needing to drive or go to work.  I want you to follow-up with the primary care for management of your blood pressure and chronic headaches  You were to develop severely worsening headache, changes in your vision, numbness tingling or weakness when she to return or report to the emergency department.  If you were to  develop chest pain or shortness of breath I like for you to return or go to the emergency department.     ED Prescriptions    None     PDMP not reviewed this encounter.   Purnell Shoemaker, PA-C 10/23/19 1101

## 2019-10-23 NOTE — Discharge Instructions (Signed)
Continue the Excedrin if you find that this helps with your headaches.  Take this at the first sign of the headache.  You may also consider taking 1-2 Benadryl at the first sign of the headache as long as you can sleep and have 8 hours prior to needing to drive or go to work.  I want you to follow-up with the primary care for management of your blood pressure and chronic headaches  You were to develop severely worsening headache, changes in your vision, numbness tingling or weakness when she to return or report to the emergency department.  If you were to develop chest pain or shortness of breath I like for you to return or go to the emergency department.

## 2019-11-20 ENCOUNTER — Ambulatory Visit (INDEPENDENT_AMBULATORY_CARE_PROVIDER_SITE_OTHER): Payer: 59 | Admitting: Internal Medicine

## 2019-11-20 ENCOUNTER — Encounter: Payer: Self-pay | Admitting: Internal Medicine

## 2019-11-20 VITALS — BP 145/90 | HR 106 | Temp 98.2°F | Ht 66.0 in | Wt 141.5 lb

## 2019-11-20 DIAGNOSIS — I10 Essential (primary) hypertension: Secondary | ICD-10-CM | POA: Diagnosis not present

## 2019-11-20 DIAGNOSIS — R000 Tachycardia, unspecified: Secondary | ICD-10-CM | POA: Diagnosis not present

## 2019-11-20 DIAGNOSIS — G43909 Migraine, unspecified, not intractable, without status migrainosus: Secondary | ICD-10-CM

## 2019-11-20 DIAGNOSIS — R05 Cough: Secondary | ICD-10-CM

## 2019-11-20 DIAGNOSIS — G43109 Migraine with aura, not intractable, without status migrainosus: Secondary | ICD-10-CM

## 2019-11-20 DIAGNOSIS — R059 Cough, unspecified: Secondary | ICD-10-CM

## 2019-11-20 MED ORDER — PANTOPRAZOLE SODIUM 20 MG PO TBEC
20.0000 mg | DELAYED_RELEASE_TABLET | Freq: Every day | ORAL | 0 refills | Status: DC
Start: 2019-11-20 — End: 2021-01-02

## 2019-11-20 MED ORDER — ALMOTRIPTAN MALATE 6.25 MG PO TABS: 6.2500 mg | ORAL_TABLET | ORAL | 0 refills | Status: DC | PRN

## 2019-11-20 MED ORDER — ATENOLOL 25 MG PO TABS
ORAL_TABLET | ORAL | 1 refills | Status: DC
Start: 2019-11-20 — End: 2019-12-21

## 2019-11-20 NOTE — Assessment & Plan Note (Signed)
Reports some trouble breathing when it's hot in the house and a dry cough when she lays flat.  She reports that it happens most every time she lays flat, going on for the past 1.5 years.  She works a very strenuous job and she does not have any symptoms at work.  She does not have any history of acid reflux or GERD.  She does report some weight gain.  Discussed the possible options for a chronic dry cough, including medication, acid reflux, cough induced asthma, or upper airway cough syndrome.  Discussed that we can try her on a PPI to see if this will help her symptoms.  She is in agreement this plan.   -Pantoprazole 20 mg daily -Return to clinic in 1 month, if symptoms not improved consider PFTs

## 2019-11-20 NOTE — Progress Notes (Signed)
   CC: Migraines, HTN  HPI:  [name] is a 54 y.o. with a history of hypertension and migraines presenting for headaches and to establish care.   Patient was seen in the urgent care on 10/23/2019 for her migraine headaches, noted to have a throbbing sensation over the left side of her head, 8 out of 10, photophobia and phonophobia, and nausea.  She was treated with Toradol and Imitrex and advised to follow-up with her PCP.  Since then she reports that she continues to have headaches.  She has had headaches since the age of 50, had been following with the headache clinic in [place] and was on multiple medications in the past, she moved away and did not have any follow-up.  She then moved back here about 6 years ago.  She is not sure exactly what medication she took however states that the Imitrex oral did not help, she thinks she was on atenolol which she reports did help.  Not sure what other medication she had been on.  She has about 15 headaches in a month, last for 3 to 4 days at a time, it moves around, sometimes on the left, sometimes on the right, and sometimes on the back of her head it is a throbbing sensation associated with photophobia, and phonophobia, and sometimes associated with nausea.  She has tried multiple over-the-counter medications including Tylenol and Advil which did not help, the only medication that helps is Excedrin Migraine.  She reports a lingering headache at this time.  Past Medical History:  Diagnosis Date  . Hypertension   . Migraines    Review of Systems:  Reports headaches, photophobia, phonophobia, nausea, dry cough, SOB.   Family history: Breast cancer in mother, aunts, and cousins.  Hypertension in mother, father, and 2 sisters.  Sister has history of migraines.  No diabetes or thyroid disorders noted.  Social history: Denies smoking, recreational drug use.  Reports alcohol use, approximately 2 glasses of wine per week.  Physical  Exam:  Vitals:   11/20/19 1508  BP: (!) 157/91  Pulse: (!) 110  Temp: 98.2 F (36.8 C)  TempSrc: Oral  SpO2: 100%  Weight: 141 lb 8 oz (64.2 kg)  Height: 5\' 6"  (1.676 m)   Physical Exam  Constitutional: She is oriented to person, place, and time and well-developed, well-nourished, and in no distress.  HENT:  Head: Normocephalic and atraumatic.  Eyes: Pupils are equal, round, and reactive to light. Conjunctivae and EOM are normal.  Cardiovascular: Normal rate and regular rhythm.  Murmur (2/6 systolic murmur, best in RUSB) heard. Pulmonary/Chest: Effort normal and breath sounds normal. No respiratory distress. She has no wheezes. She has no rales.  Abdominal: Soft. Bowel sounds are normal.  Musculoskeletal:        General: No edema. Normal range of motion.     Cervical back: Normal range of motion and neck supple.  Neurological: She is alert and oriented to person, place, and time.  Skin: Skin is warm and dry. No erythema.  Psychiatric: Mood and affect normal.    Assessment & Plan:   See Encounters Tab for problem based charting.  Patient discussed with Dr. 

## 2019-11-20 NOTE — Assessment & Plan Note (Signed)
Patient reports that she has had tachycardia for years now, is not sure why, heart rate today is 110.  She denies palpations, CP, SOB, or other symptoms.  Is unclear if she ever had any testing or labs done.  EKG in the 2017 showed NSR.  Will obtain labs to further evaluate.  -BMP -TSH

## 2019-11-20 NOTE — Assessment & Plan Note (Signed)
Blood pressure today is 157/91. She is not currently on any medications at home. Discussed starting a beta blocker for both the BP and the migraines. She is in agreement with this plan.   -Atenolol daily, 25 mg weekly been increased by 25 mg daily until up to 100 mg daily -BMP

## 2019-11-20 NOTE — Patient Instructions (Addendum)
Ms. [name],  It was a pleasure to see you today. Thank you for coming in.   Today we discussed your headaches. In regards to this please start taking atenolol daily, start with 25 mg daily then increase by 25 mg daily each week up to 100 mg daily. This should help decrease the frequency of headaches but can take a few weeks to take effect. You can take almotriptan for when you do get headaches. You can continue taking the migraine relief as needed.    We also discussed your blood pressure, it is a little high today. The Atenolol should help with this.   For your cough, please start taking pantoprazole daily to see if this helps.   I have ordered some labs.   Please return to clinic in 1 month or sooner if needed.   Thank you again for coming in.   [name].D.

## 2019-11-20 NOTE — Assessment & Plan Note (Signed)
Patient was seen in the urgent care on 10/23/2019 for her migraine headaches, noted to have a throbbing sensation over the left side of her head, 8 out of 10, photophobia and phonophobia, and nausea.  She was treated with Toradol and Imitrex and advised to follow-up with her PCP.  Since then she reports that she continues to have headaches.  She has had headaches since the age of 89, had been following with the headache clinic in [place] and was on multiple medications in the past, she moved away and did not have any follow-up.  She then moved back here about 6 years ago.  She is not sure exactly what medication she took however states that the Imitrex oral did not help, she thinks she was on atenolol which she reports did help.  Not sure what other medication she had been on.  She has about 15 headaches in a month, last for 3 to 4 days at a time, it moves around, sometimes on the left, sometimes on the right, and sometimes on the back of her head it is a throbbing sensation associated with photophobia, and phonophobia, and sometimes associated with nausea.  She has tried multiple over-the-counter medications including Tylenol and Advil which did not help, the only medication that helps is Excedrin Migraine.  She reports a lingering headache at this time.  Exam is unremarkable today.  Symptoms seem to be consistent with migraine headaches and she had been following with the headache clinic in the past.  Discussed trying to treat her here versus referring to the headache clinic, she is agreeable to trying to control her migraines here for now.  Since atenolol had helped in the past we will resume this, we will also trial a different triptan for abortive therapy. Atenolol will also help with her HTN.   -Atenolol 25 mg daily, increase by 25 mg daily each week until up to 100 mg daily -Almotriptan 6.25 mg PRN for migraines -RTC in 1 month to re-evaluate, if persistent headaches can refer to headache clinic

## 2019-11-21 LAB — BMP8+ANION GAP
Anion Gap: 15 mmol/L (ref 10.0–18.0)
BUN/Creatinine Ratio: 19 (ref 9–23)
BUN: 19 mg/dL (ref 6–24)
CO2: 24 mmol/L (ref 20–29)
Calcium: 10 mg/dL (ref 8.7–10.2)
Chloride: 105 mmol/L (ref 96–106)
Creatinine, Ser: 1.02 mg/dL — ABNORMAL HIGH (ref 0.57–1.00)
GFR calc Af Amer: 73 mL/min/{1.73_m2} (ref >59)
GFR calc non Af Amer: 63 mL/min/{1.73_m2} (ref >59)
Glucose: 96 mg/dL (ref 65–99)
Potassium: 5 mmol/L (ref 3.5–5.2)
Sodium: 144 mmol/L (ref 134–144)

## 2019-11-21 LAB — TSH: TSH: 1.33 u[IU]/mL (ref 0.450–4.500)

## 2019-11-21 NOTE — Progress Notes (Signed)
Internal Medicine Clinic Attending  Case discussed with Dr. Krienke at the time of the visit.  We reviewed the resident's history and exam and pertinent patient test results.  I agree with the assessment, diagnosis, and plan of care documented in the resident's note.    

## 2019-12-07 ENCOUNTER — Other Ambulatory Visit: Payer: Self-pay | Admitting: *Deleted

## 2019-12-07 MED ORDER — ALMOTRIPTAN MALATE 6.25 MG PO TABS: 6.2500 mg | ORAL_TABLET | ORAL | 0 refills | Status: DC | PRN

## 2019-12-07 NOTE — Telephone Encounter (Signed)
Next appt scheduled 7/2 with PCP.

## 2019-12-11 ENCOUNTER — Encounter: Payer: Self-pay | Admitting: *Deleted

## 2019-12-11 ENCOUNTER — Telehealth: Payer: Self-pay | Admitting: *Deleted

## 2019-12-11 NOTE — Telephone Encounter (Signed)
"  Attempted to contact patient with no answer. Labs are normal. Please send letter to patient. Thank you. " per Dr Gwyneth Revels.  Called pt - no answer; left message to call the office.

## 2019-12-11 NOTE — Telephone Encounter (Signed)
-----   Message from Lianne Bushy sent at 12/05/2019  3:31 PM EDT ----- Front office pool unable to send lab results letter.

## 2019-12-12 NOTE — Telephone Encounter (Signed)
Incoming call from pt- informed "labs are normal".  No further action needed, phone call complete.Annette Spittle Cassady6/23/20218:41 AM

## 2019-12-21 ENCOUNTER — Encounter: Payer: Self-pay | Admitting: Internal Medicine

## 2019-12-21 ENCOUNTER — Ambulatory Visit (INDEPENDENT_AMBULATORY_CARE_PROVIDER_SITE_OTHER): Payer: 59 | Admitting: Internal Medicine

## 2019-12-21 VITALS — BP 135/69 | HR 91 | Wt 144.1 lb

## 2019-12-21 DIAGNOSIS — F418 Other specified anxiety disorders: Secondary | ICD-10-CM | POA: Insufficient documentation

## 2019-12-21 DIAGNOSIS — G43909 Migraine, unspecified, not intractable, without status migrainosus: Secondary | ICD-10-CM

## 2019-12-21 DIAGNOSIS — G43109 Migraine with aura, not intractable, without status migrainosus: Secondary | ICD-10-CM

## 2019-12-21 MED ORDER — ATENOLOL 100 MG PO TABS: 100.0000 mg | ORAL_TABLET | Freq: Every day | ORAL | 11 refills | Status: DC

## 2019-12-21 MED ORDER — PAROXETINE HCL 10 MG PO TABS
10.0000 mg | ORAL_TABLET | Freq: Every day | ORAL | 2 refills | Status: DC
Start: 2019-12-21 — End: 2020-03-25

## 2019-12-21 NOTE — Assessment & Plan Note (Signed)
Patient presents for 1 months follow-up on migraine headaches. She was started on preventative therapy with atenolol 100 mg daily, as well as abortive therapy with sumatriptan.  She states her migraine frequency has remained the same, approximately 2 per week. Sumatriptan does not relieve her symptoms once they start. No change in nature or pattern of headaches. Endorses associated nausea and photophobia with symptom onset. No aura beforehand. Denies any other neurologic symptoms.  She was previously seen in headache clinic several years ago and had good control of her migraines at that time. Will place new referral to headache clinic given that she has failed both preventative and abortive therapy.

## 2019-12-21 NOTE — Assessment & Plan Note (Signed)
Patient works third shift as a Hospital doctor. She almost get into an accident a few weeks ago while merging onto the highway. Since then she reports that every time she has to merge onto the highway, she develops consistent with a panic attack requiring her to pullover. Symptoms subside after a few minutes. She does not have these symptoms at any other time.  Her presentation is consistent with situational anxiety disorder vs PTSD following an event. Will start Paroxetine 10 mg for symptom control and refer to [facility] for CBT.

## 2019-12-21 NOTE — Patient Instructions (Signed)
[name], It was nice meeting you.  Today we discussed your migraine headaches. I am referring you to the headache clinic to hopefully get better control since the medications we have tried are not working.   For your panic attacks with merging on the highway, I am starting a medication to take once a day. This may take several weeks before you notice a difference. I would also try taking the atenolol shortly before you start your shift to see if it makes a difference. I'm referring you to our behavior health counselor that can be helpful with learning different techniques to help remain calm when you have to merge.   Take care, Dr. Chesley Mires

## 2019-12-21 NOTE — Progress Notes (Signed)
Acute Office Visit  Subjective:    Patient ID: [name], female    DOB: [birth year], 54 y.o.   MRN: [id number]  Chief Complaint  Patient presents with  . Migraine    one mth follow up    HPI Patient is in today for follow-up on migraine headaches. Please see problem based charting for further details.   Past Medical History:  Diagnosis Date  . Hypertension   . Migraines     Past Surgical History:  Procedure Laterality Date  . ABDOMINAL HYSTERECTOMY    . CHOLECYSTECTOMY      Family History  Problem Relation Age of Onset  . Hypertension Mother   . Hypertension Father     Social History   Socioeconomic History  . Marital status: Legally Separated    Spouse name: Not on file  . Number of children: Not on file  . Years of education: Not on file  . Highest education level: Not on file  Occupational History  . Not on file  Tobacco Use  . Smoking status: Never Smoker  . Smokeless tobacco: Never Used  Vaping Use  . Vaping Use: Never used  Substance and Sexual Activity  . Alcohol use: No  . Drug use: No  . Sexual activity: Not on file  Other Topics Concern  . Not on file  Social History Narrative  . Not on file   Social Determinants of Health   Financial Resource Strain:   . Difficulty of Paying Living Expenses:   Food Insecurity:   . Worried About Programme researcher, broadcasting/film/video in the Last Year:   . Barista in the Last Year:   Transportation Needs:   . Freight forwarder (Medical):   Marland Kitchen Lack of Transportation (Non-Medical):   Physical Activity:   . Days of Exercise per Week:   . Minutes of Exercise per Session:   Stress:   . Feeling of Stress :   Social Connections:   . Frequency of Communication with Friends and Family:   . Frequency of Social Gatherings with Friends and Family:   . Attends Religious Services:   . Active Member of Clubs or Organizations:   . Attends Banker Meetings:   Marland Kitchen Marital Status:   Intimate Partner  Violence:   . Fear of Current or Ex-Partner:   . Emotionally Abused:   Marland Kitchen Physically Abused:   . Sexually Abused:     Outpatient Medications Prior to Visit  Medication Sig Dispense Refill  . acetaminophen (TYLENOL) 500 MG tablet Take 1,000 mg by mouth every 6 (six) hours as needed for mild pain.    Marland Kitchen almotriptan (AXERT) 6.25 MG tablet Take 1 tablet (6.25 mg total) by mouth as needed for migraine. may repeat in 2 hours if needed 15 tablet 0  . aspirin-acetaminophen-caffeine (MIGRAINE RELIEF) 250-250-65 MG tablet Take 2 tablets by mouth every 6 (six) hours as needed for headache or migraine.    . Melatonin 3 MG TABS Take 3 mg by mouth as needed (sleep).    . Multiple Vitamins-Minerals (ONE-A-DAY WOMENS 50 PLUS) TABS Take 1 tablet by mouth 3 (three) times a week.    . pantoprazole (PROTONIX) 20 MG tablet Take 1 tablet (20 mg total) by mouth daily. 30 tablet 0  . atenolol (TENORMIN) 25 MG tablet Take 1 tablet (25 mg total) by mouth daily for 7 days, THEN 2 tablets (50 mg total) daily for 7 days, THEN 3 tablets (75 mg total)  daily for 7 days, THEN 4 tablets (100 mg total) daily for 7 days. 70 tablet 1   No facility-administered medications prior to visit.    Allergies  Allergen Reactions  . Codeine Itching    Review of Systems  Constitutional: Negative for fever.  HENT: Negative for hearing loss and tinnitus.   Eyes: Positive for photophobia. Negative for pain, discharge and visual disturbance.  Gastrointestinal: Positive for nausea.  Neurological: Negative for dizziness, facial asymmetry, speech difficulty, weakness, light-headedness and numbness.       Objective:    Physical Exam Constitutional:      General: She is not in acute distress.    Appearance: Normal appearance.  Cardiovascular:     Rate and Rhythm: Normal rate and regular rhythm.  Pulmonary:     Effort: Pulmonary effort is normal.     Breath sounds: Normal breath sounds.  Abdominal:     General: There is no  distension.     Palpations: Abdomen is soft.     Tenderness: There is no abdominal tenderness.  Musculoskeletal:     Right lower leg: No edema.     Left lower leg: No edema.  Neurological:     General: No focal deficit present.     Mental Status: She is alert and oriented to person, place, and time.  Psychiatric:        Mood and Affect: Mood normal.        Behavior: Behavior normal.     BP 135/69 (BP Location: Left Arm, Patient Position: Sitting, Cuff Size: Normal)   Pulse 91   Wt 144 lb 1.6 oz (65.4 kg)   SpO2 99%   BMI 23.26 kg/m  Wt Readings from Last 3 Encounters:  12/21/19 144 lb 1.6 oz (65.4 kg)  11/20/19 141 lb 8 oz (64.2 kg)  10/23/19 125 lb (56.7 kg)    Health Maintenance Due  Topic Date Due  . Hepatitis C Screening  Never done  . COVID-19 Vaccine (1) Never done  . HIV Screening  Never done  . TETANUS/TDAP  Never done  . PAP SMEAR-Modifier  Never done  . MAMMOGRAM  Never done  . COLONOSCOPY  02/02/2017    There are no preventive care reminders to display for this patient.   Lab Results  Component Value Date   TSH 1.330 11/20/2019   Lab Results  Component Value Date   WBC 5.8 12/17/2015   HGB 13.3 12/17/2015   HCT 39.0 12/17/2015   MCV 91.9 12/17/2015   PLT 328 12/17/2015   Lab Results  Component Value Date   NA 144 11/20/2019   K 5.0 11/20/2019   CO2 24 11/20/2019   GLUCOSE 96 11/20/2019   BUN 19 11/20/2019   CREATININE 1.02 (H) 11/20/2019   BILITOT 0.5 07/19/2015   ALKPHOS 43 07/19/2015   AST 19 07/19/2015   ALT 15 07/19/2015   PROT 7.6 07/19/2015   ALBUMIN 4.1 07/19/2015   CALCIUM 10.0 11/20/2019   ANIONGAP 11 07/19/2015   No results found for: CHOL No results found for: HDL No results found for: LDLCALC No results found for: TRIG No results found for: CHOLHDL No results found for: XKGY1E     Assessment & Plan:   Problem List Items Addressed This Visit      Cardiovascular and Mediastinum   Migraines - Primary    Patient  presents for 1 months follow-up on migraine headaches. She was started on preventative therapy with atenolol 100 mg daily, as well  as abortive therapy with sumatriptan.  She states her migraine frequency has remained the same, approximately 2 per week. Sumatriptan does not relieve her symptoms once they start. No change in nature or pattern of headaches. Endorses associated nausea and photophobia with symptom onset. No aura beforehand. Denies any other neurologic symptoms.  She was previously seen in headache clinic several years ago and had good control of her migraines at that time. Will place new referral to headache clinic given that she has failed both preventative and abortive therapy.       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 Since then she reports that every time she has to merge onto the highway, she develops consistent with a panic attack requiring her to pullover. Symptoms subside after a few minutes. She does not have these symptoms at any other time.  Her presentation is consistent with situational anxiety disorder vs PTSD following an event. Will start Paroxetine 10 mg for symptom control and refer to [facility] for CBT.       Relevant Medications   PARoxetine (PAXIL) 10 MG tablet   Other Relevant Orders   Ambulatory referral to Integrated Behavioral Health       Meds ordered this encounter  Medications  . PARoxetine (PAXIL) 10 MG tablet    Sig: Take 1 tablet (10 mg total) by mouth daily.    Dispense:  30 tablet    Refill:  2  . atenolol (TENORMIN) 100 MG tablet    Sig: Take 1 tablet (100 mg total) by mouth daily.    Dispense:  30 tablet    Refill:  11     Kawon Willcutt D Ziomara Birenbaum, DO

## 2019-12-31 NOTE — Progress Notes (Signed)
Internal Medicine Clinic Attending  Case discussed with Dr. Bloomfield  At the time of the visit.  We reviewed the resident's history and exam and pertinent patient test results.  I agree with the assessment, diagnosis, and plan of care documented in the resident's note.  

## 2020-01-09 ENCOUNTER — Telehealth: Payer: Self-pay | Admitting: Licensed Clinical Social Worker

## 2020-01-09 NOTE — Telephone Encounter (Signed)
Patient was called to discuss a referral from her doctor. Patient did not answer, and a vm was left for the patient. A letter was mailed today.

## 2020-02-12 NOTE — Addendum Note (Signed)
Addended by: Neomia Dear on: 02/12/2020 07:53 AM   Modules accepted: Orders

## 2020-03-25 ENCOUNTER — Other Ambulatory Visit: Payer: Self-pay | Admitting: Internal Medicine

## 2020-03-30 ENCOUNTER — Encounter (HOSPITAL_COMMUNITY): Payer: Self-pay

## 2020-03-30 ENCOUNTER — Other Ambulatory Visit: Payer: Self-pay

## 2020-03-30 ENCOUNTER — Ambulatory Visit (INDEPENDENT_AMBULATORY_CARE_PROVIDER_SITE_OTHER): Payer: 59

## 2020-03-30 ENCOUNTER — Ambulatory Visit (HOSPITAL_COMMUNITY)
Admission: EM | Admit: 2020-03-30 | Discharge: 2020-03-30 | Disposition: A | Discharge status: Home / Self Care | Payer: 59 | Attending: Emergency Medicine | Admitting: Emergency Medicine

## 2020-03-30 DIAGNOSIS — M25474 Effusion, right foot: Secondary | ICD-10-CM

## 2020-03-30 DIAGNOSIS — S9032XA Contusion of left foot, initial encounter: Secondary | ICD-10-CM | POA: Diagnosis not present

## 2020-03-30 DIAGNOSIS — M79671 Pain in right foot: Secondary | ICD-10-CM | POA: Diagnosis not present

## 2020-03-30 MED ORDER — IBUPROFEN 800 MG PO TABS: 800.0000 mg | ORAL_TABLET | Freq: Three times a day (TID) | ORAL | 0 refills | Status: DC | PRN

## 2020-03-30 NOTE — ED Provider Notes (Signed)
[facility]    CSN: [id number] Arrival date & time: 03/30/20  1003      History   Chief Complaint Chief Complaint  Patient presents with   Foot Injury    HPI [name] is a 54 y.o. female.   Patient presents with left foot pain and swelling after a railing fell on her foot at work.  She denies numbness, paresthesias, weakness.  No open wounds.  Treatment attempted with ibuprofen and ice pack.  Her medical history includes hypertension and migraines.  The history is provided by the patient.    Past Medical History:  Diagnosis Date   Hypertension    Migraines     Patient Active Problem List   Diagnosis Date Noted   Situational anxiety 12/21/2019   Migraines 11/20/2019   Hypertension 11/20/2019   Cough 11/20/2019   Tachycardia 11/20/2019    Past Surgical History:  Procedure Laterality Date   ABDOMINAL HYSTERECTOMY     CHOLECYSTECTOMY     CYST EXCISION      OB History   No obstetric history on file.      Home Medications    Prior to Admission medications   Medication Sig Start Date End Date Taking? Authorizing Provider  atenolol (TENORMIN) 100 MG tablet Take 1 tablet (100 mg total) by mouth daily. 12/21/19 12/20/20 Yes Bloomfield, Carley D, DO  Multiple Vitamins-Minerals (ONE-A-DAY WOMENS 50 PLUS) TABS Take 1 tablet by mouth 3 (three) times a week.   Yes [provider]  PARoxetine (PAXIL) 10 MG tablet Take 1 tablet by mouth once daily 03/25/20  Yes Bloomfield, Carley D, DO  acetaminophen (TYLENOL) 500 MG tablet Take 1,000 mg by mouth every 6 (six) hours as needed for mild pain.    [provider]  almotriptan (AXERT) 6.25 MG tablet Take 1 tablet (6.25 mg total) by mouth as needed for migraine. may repeat in 2 hours if needed 12/07/19   Lenward Chancellor D, DO  aspirin-acetaminophen-caffeine (MIGRAINE RELIEF) [phone] MG tablet Take 2 tablets by mouth every 6 (six) hours as needed for headache or migraine.     [provider]  ibuprofen (ADVIL) 800 MG tablet Take 1 tablet (800 mg total) by mouth every 8 (eight) hours as needed. 03/30/20   Mickie Bail, NP  Melatonin 3 MG TABS Take 3 mg by mouth as needed (sleep).    [provider]  pantoprazole (PROTONIX) 20 MG tablet Take 1 tablet (20 mg total) by mouth daily. 11/20/19   [name], MD    Family History Family History  Problem Relation Age of Onset   Hypertension Mother    Hypertension Father     Social History Social History   Tobacco Use   Smoking status: Never Smoker   Smokeless tobacco: Never Used  Building services engineer Use: Never used  Substance Use Topics   Alcohol use: No   Drug use: No     Allergies   Codeine   Review of Systems Review of Systems  Constitutional: Negative for chills and fever.  HENT: Negative for ear pain and sore throat.   Eyes: Negative for pain and visual disturbance.  Respiratory: Negative for cough and shortness of breath.   Cardiovascular: Negative for chest pain and palpitations.  Gastrointestinal: Negative for abdominal pain and vomiting.  Genitourinary: Negative for dysuria and hematuria.  Musculoskeletal: Positive for arthralgias. Negative for back pain.  Skin: Negative for color change and rash.  Neurological: Negative for seizures,  syncope, weakness and numbness.  All other systems reviewed and are negative.    Physical Exam Triage Vital Signs ED Triage Vitals [03/30/20 1021]  Enc Vitals Group     BP (!) 146/85     Pulse Rate 81     Resp 18     Temp 99.2 F (37.3 C)     Temp Source Oral     SpO2 100 %     Weight      Height      Head Circumference      Peak Flow      Pain Score 6     Pain Loc      Pain Edu?      Excl. in GC?    No data found.  Updated Vital Signs BP (!) 146/85 (BP Location: Right Arm)    Pulse 81    Temp 99.2 F (37.3 C) (Oral)    Resp 18    SpO2 100%   Visual Acuity Right Eye Distance:   Left Eye Distance:     Bilateral Distance:    Right Eye Near:   Left Eye Near:    Bilateral Near:     Physical Exam Vitals and nursing note reviewed.  Constitutional:      General: She is not in acute distress.    Appearance: She is well-developed.  HENT:     Head: Normocephalic and atraumatic.     Mouth/Throat:     Mouth: Mucous membranes are moist.  Eyes:     Conjunctiva/sclera: Conjunctivae normal.  Cardiovascular:     Rate and Rhythm: Normal rate and regular rhythm.     Heart sounds: No murmur heard.   Pulmonary:     Effort: Pulmonary effort is normal. No respiratory distress.     Breath sounds: Normal breath sounds.  Abdominal:     Palpations: Abdomen is soft.     Tenderness: There is no abdominal tenderness.  Musculoskeletal:        General: Swelling, tenderness and deformity present. Normal range of motion.     Cervical back: Neck supple.       Feet:  Skin:    General: Skin is warm and dry.     Findings: No bruising, erythema, lesion or rash.  Neurological:     General: No focal deficit present.     Mental Status: She is alert and oriented to person, place, and time.     Sensory: No sensory deficit.     Motor: No weakness.     Gait: Gait abnormal.  Psychiatric:        Mood and Affect: Mood normal.        Behavior: Behavior normal.      UC Treatments / Results  Labs (all labs ordered are listed, but only abnormal results are displayed) Labs Reviewed - No data to display  EKG   Radiology DG Foot Complete Left  Result Date: 03/30/2020 CLINICAL DATA:  Pain and swelling post blunt trauma EXAM: LEFT FOOT - COMPLETE 3+ VIEW COMPARISON:  None. FINDINGS: There is no evidence of fracture or dislocation. There is no evidence of arthropathy or other focal bone abnormality. Soft tissues are unremarkable. IMPRESSION: Negative. Electronically Signed   By: Corlis Leak M.D.   On: 03/30/2020 11:17    Procedures Procedures (including critical care time)  Medications Ordered in  UC Medications - No data to display  Initial Impression / Assessment and Plan / UC Course  I have reviewed the triage  vital signs and the nursing notes.  Pertinent labs & imaging results that were available during my care of the patient were reviewed by me and considered in my medical decision making (see chart for details).   Left foot contusion.  X-ray negative.  Treating with ibuprofen, rest, elevation, ice packs.  Instructed patient to follow-up with an orthopedist if her symptoms are not improving.  Patient agrees to plan of care.   Final Clinical Impressions(s) / UC Diagnoses   Final diagnoses:  Contusion of left foot, initial encounter     Discharge Instructions     Take the ibuprofen as prescribed.  Rest and elevate your foot.  Apply ice packs 2-3 times a day for up to 20 minutes each.    Follow up with an orthopedist if your symptoms are not improving.          ED Prescriptions    Medication Sig Dispense Auth. Provider   ibuprofen (ADVIL) 800 MG tablet Take 1 tablet (800 mg total) by mouth every 8 (eight) hours as needed. 21 tablet Mickie Bail, NP     PDMP not reviewed this encounter.   Mickie Bail, NP 03/30/20 1136

## 2020-03-30 NOTE — ED Triage Notes (Signed)
Patient in with c/o of left foot pain after rail from order picker fell on her foot at work.   Describes pain as sore and intermittent burning. +2 pedal pulse with noticeable edema. Tender upon palpation,.  Patient took Ibuprofen last nice and applied ice to foot with minimal relief  Denies numbness or tingling to extremity.

## 2020-03-30 NOTE — Discharge Instructions (Addendum)
Take the ibuprofen as prescribed.  Rest and elevate your foot.  Apply ice packs 2-3 times a day for up to 20 minutes each.      Follow up with an orthopedist if your symptoms are not improving.    

## 2020-04-30 ENCOUNTER — Other Ambulatory Visit: Payer: Self-pay | Admitting: Internal Medicine

## 2020-11-28 ENCOUNTER — Encounter: Payer: Self-pay | Admitting: *Deleted

## 2020-12-27 ENCOUNTER — Other Ambulatory Visit: Payer: Self-pay | Admitting: Internal Medicine

## 2021-01-02 ENCOUNTER — Ambulatory Visit (HOSPITAL_COMMUNITY)
Admission: EM | Admit: 2021-01-02 | Discharge: 2021-01-02 | Disposition: A | Discharge status: Home / Self Care | Payer: 59 | Attending: Physician Assistant | Admitting: Physician Assistant

## 2021-01-02 ENCOUNTER — Other Ambulatory Visit: Payer: Self-pay

## 2021-01-02 ENCOUNTER — Encounter (HOSPITAL_COMMUNITY): Payer: Self-pay | Admitting: Emergency Medicine

## 2021-01-02 DIAGNOSIS — R110 Nausea: Secondary | ICD-10-CM | POA: Diagnosis not present

## 2021-01-02 DIAGNOSIS — G43001 Migraine without aura, not intractable, with status migrainosus: Secondary | ICD-10-CM

## 2021-01-02 MED ORDER — KETOROLAC TROMETHAMINE 30 MG/ML IJ SOLN
INTRAMUSCULAR | Status: AC
  Filled 2021-01-02: qty 1

## 2021-01-02 MED ORDER — BACLOFEN 10 MG PO TABS: 10.0000 mg | ORAL_TABLET | Freq: Two times a day (BID) | ORAL | 0 refills | Status: DC | PRN

## 2021-01-02 MED ORDER — KETOROLAC TROMETHAMINE 30 MG/ML IJ SOLN
30.0000 mg | Freq: Once | INTRAMUSCULAR | Status: AC
  Administered 2021-01-02: 30 mg via INTRAMUSCULAR

## 2021-01-02 MED ORDER — ONDANSETRON 4 MG PO TBDP
ORAL_TABLET | ORAL | Status: AC
  Filled 2021-01-02: qty 1

## 2021-01-02 MED ORDER — ONDANSETRON 4 MG PO TBDP
4.0000 mg | ORAL_TABLET | Freq: Once | ORAL | Status: AC
  Administered 2021-01-02: 4 mg via ORAL

## 2021-01-02 MED ORDER — ONDANSETRON 4 MG PO TBDP: 4.0000 mg | ORAL_TABLET | Freq: Three times a day (TID) | ORAL | 0 refills | Status: DC | PRN

## 2021-01-02 NOTE — ED Triage Notes (Signed)
Pt is present with a migraine and nausea. Pt states that her migraine started three days ago.

## 2021-01-02 NOTE — Discharge Instructions (Addendum)
Take baclofen up to twice a day as needed.  This will make you sleepy so please do not drive or drink alcohol while taking this medication.  He can use over-the-counter medications for symptom relief including Tylenol.  Use Zofran up to 3 times a day as needed for nausea.  Make sure you are drinking plenty of water and eat small frequent meals.  If you have any sudden worsening symptoms including sudden severe headache, nausea/vomiting preventing oral intake, difficulty speaking, weakness, trouble finding words you need to go to the emergency room.  Follow-up with primary care provider within 1 week.

## 2021-01-02 NOTE — ED Provider Notes (Signed)
[facility]    CSN: [id number] Arrival date & time: 01/02/21  0816      History   Chief Complaint Chief Complaint  Patient presents with  . Migraine    HPI [name] is a 55 y.o. female.   Patient presents today with a 3-day history of migraines.  She has a history of migraines and states this is her typical migraine pattern but she has been unable to get rid of it with typical over-the-counter abortive therapy.  She reports headache is rated 7 on a 0-10 pain scale, localized to bitemporal region with radiation posteriorly, described as throbbing, worse with activity, no alleviating factors identified.  She denies any recent head injury, recent illness, medication changes prior to symptom onset.  She reports migraines have been a problem since she was 55 years old.  This is not the worst headache of her life.  She denies any dysarthria, focal weakness, vision changes, syncope, dizziness.  She does report associated nausea but denies vomiting.  She has not seen a urologist recently.  She has been given triptan medication in the past but this has been ineffective in managing symptoms.  She did miss work as a result of symptoms today.   Past Medical History:  Diagnosis Date  . Hypertension   . Migraines     Patient Active Problem List   Diagnosis Date Noted  . Situational anxiety 12/21/2019  . Migraines 11/20/2019  . Hypertension 11/20/2019  . Cough 11/20/2019  . Tachycardia 11/20/2019    Past Surgical History:  Procedure Laterality Date  . ABDOMINAL HYSTERECTOMY    . CHOLECYSTECTOMY    . CYST EXCISION      OB History   No obstetric history on file.      Home Medications    Prior to Admission medications   Medication Sig Start Date End Date Taking? Authorizing Provider  baclofen (LIORESAL) 10 MG tablet Take 1 tablet (10 mg total) by mouth 2 (two) times daily as needed for muscle spasms. 01/02/21  Yes Iverson Sees K, PA-C  ondansetron (ZOFRAN ODT)  4 MG disintegrating tablet Take 1 tablet (4 mg total) by mouth every 8 (eight) hours as needed for nausea or vomiting. 01/02/21  Yes Jaceion Aday K, PA-C  acetaminophen (TYLENOL) 500 MG tablet Take 1,000 mg by mouth every 6 (six) hours as needed for mild pain.    [provider]  aspirin-acetaminophen-caffeine (MIGRAINE RELIEF) [phone] MG tablet Take 2 tablets by mouth every 6 (six) hours as needed for headache or migraine.    [provider]  atenolol (TENORMIN) 100 MG tablet Take 1 tablet by mouth once daily 12/29/20   Verdene Lennert, MD  ibuprofen (ADVIL) 800 MG tablet Take 1 tablet (800 mg total) by mouth every 8 (eight) hours as needed. 03/30/20   Mickie Bail, NP  Melatonin 3 MG TABS Take 3 mg by mouth as needed (sleep).    [provider]  Multiple Vitamins-Minerals (ONE-A-DAY WOMENS 50 PLUS) TABS Take 1 tablet by mouth 3 (three) times a week.    [provider]  PARoxetine (PAXIL) 10 MG tablet Take 1 tablet by mouth once daily 04/30/20   Remo Lipps, MD    Family History Family History  Problem Relation Age of Onset  . Hypertension Mother   . Hypertension Father     Social History Social History   Tobacco Use  . Smoking status: Never  . Smokeless tobacco: Never  Vaping Use  .  Vaping Use: Never used  Substance Use Topics  . Alcohol use: No  . Drug use: No     Allergies   Codeine   Review of Systems Review of Systems  Constitutional:  Positive for activity change. Negative for appetite change, fatigue and fever.  HENT:  Negative for congestion, sinus pressure, sneezing and sore throat.   Eyes:  Positive for photophobia. Negative for visual disturbance.  Respiratory:  Negative for cough and shortness of breath.   Cardiovascular:  Negative for chest pain.  Gastrointestinal:  Positive for nausea. Negative for abdominal pain, diarrhea and vomiting.  Musculoskeletal:  Negative for arthralgias and myalgias.  Neurological:  Positive  for headaches. Negative for dizziness, syncope, facial asymmetry, speech difficulty, weakness, light-headedness and numbness.    Physical Exam Triage Vital Signs ED Triage Vitals  Enc Vitals Group     BP 01/02/21 0839 (!) 135/53     Pulse Rate 01/02/21 0839 91     Resp 01/02/21 0839 17     Temp 01/02/21 0839 98.5 F (36.9 C)     Temp Source 01/02/21 0839 Oral     SpO2 01/02/21 0839 99 %     Weight --      Height --      Head Circumference --      Peak Flow --      Pain Score 01/02/21 0837 8     Pain Loc --      Pain Edu? --      Excl. in GC? --    No data found.  Updated Vital Signs BP (!) 145/83 (BP Location: Left Arm)   Pulse 91   Temp 98.5 F (36.9 C) (Oral)   Resp 17   SpO2 99%   Visual Acuity Right Eye Distance:   Left Eye Distance:   Bilateral Distance:    Right Eye Near:   Left Eye Near:    Bilateral Near:     Physical Exam Vitals reviewed.  Constitutional:      General: She is awake. She is not in acute distress.    Appearance: Normal appearance. She is normal weight. She is not ill-appearing.     Comments: Very pleasant female appears stated age in no acute distress sitting comfortably in a darkened exam room  HENT:     Head: Normocephalic and atraumatic. No raccoon eyes, Battle's sign or contusion.     Right Ear: Tympanic membrane, ear canal and external ear normal. No hemotympanum.     Left Ear: Tympanic membrane, ear canal and external ear normal. No hemotympanum.     Mouth/Throat:     Tongue: Tongue does not deviate from midline.  Eyes:     Extraocular Movements: Extraocular movements intact.     Pupils: Pupils are equal, round, and reactive to light.  Cardiovascular:     Rate and Rhythm: Normal rate and regular rhythm.     Heart sounds: Normal heart sounds, S1 normal and S2 normal. No murmur heard. Pulmonary:     Effort: Pulmonary effort is normal.     Breath sounds: Normal breath sounds. No wheezing, rhonchi or rales.  Abdominal:      General: Bowel sounds are normal.     Palpations: Abdomen is soft.     Tenderness: There is no abdominal tenderness.  Musculoskeletal:     Cervical back: No spinous process tenderness or muscular tenderness.     Comments: Strength 5/5 bilateral upper and lower extremities  Lymphadenopathy:     Head:  Right side of head: No submental, submandibular or tonsillar adenopathy.     Left side of head: No submental, submandibular or tonsillar adenopathy.  Neurological:     General: No focal deficit present.     Cranial Nerves: Cranial nerves are intact.     Motor: Motor function is intact.     Coordination: Coordination is intact.     Gait: Gait is intact.     Comments: Cranial nerves II through XII intact.  Psychiatric:        Behavior: Behavior is cooperative.     UC Treatments / Results  Labs (all labs ordered are listed, but only abnormal results are displayed) Labs Reviewed - No data to display  EKG   Radiology No results found.  Procedures Procedures (including critical care time)  Medications Ordered in UC Medications  ketorolac (TORADOL) 30 MG/ML injection 30 mg (30 mg Intramuscular Given 01/02/21 0921)  ondansetron (ZOFRAN-ODT) disintegrating tablet 4 mg (4 mg Oral Given 01/02/21 7026)    Initial Impression / Assessment and Plan / UC Course  I have reviewed the triage vital signs and the nursing notes.  Pertinent labs & imaging results that were available during my care of the patient were reviewed by me and considered in my medical decision making (see chart for details).      Vital signs and physical exam reassuring today; no indication for emergent evaluation or imaging.  Patient was given Toradol in clinic with improvement but not complete resolution of symptoms.  She did have resolution of nausea with Zofran.  She was prescribed baclofen to be used up to twice a day as needed with instruction not to drive or drink alcohol with this medication as drowsiness is  a common side effect.  She was provided outpatient prescription for Zofran to be used as needed.  Recommended she use conservative treatment measures including over-the-counter analgesics and rest for symptom relief.  Discussed alarm symptoms that warrant emergent evaluation.  Strict return precautions given to which patient expressed understanding.  Final Clinical Impressions(s) / UC Diagnoses   Final diagnoses:  Migraine without aura and with status migrainosus, not intractable  Nausea without vomiting     Discharge Instructions      Take baclofen up to twice a day as needed.  This will make you sleepy so please do not drive or drink alcohol while taking this medication.  He can use over-the-counter medications for symptom relief including Tylenol.  Use Zofran up to 3 times a day as needed for nausea.  Make sure you are drinking plenty of water and eat small frequent meals.  If you have any sudden worsening symptoms including sudden severe headache, nausea/vomiting preventing oral intake, difficulty speaking, weakness, trouble finding words you need to go to the emergency room.  Follow-up with primary care provider within 1 week.     ED Prescriptions     Medication Sig Dispense Auth. Provider   baclofen (LIORESAL) 10 MG tablet Take 1 tablet (10 mg total) by mouth 2 (two) times daily as needed for muscle spasms. 30 each Kaeleigh Westendorf K, PA-C   ondansetron (ZOFRAN ODT) 4 MG disintegrating tablet Take 1 tablet (4 mg total) by mouth every 8 (eight) hours as needed for nausea or vomiting. 20 tablet Mariaelena Cade, Noberto Retort, PA-C      PDMP not reviewed this encounter.   Jeani Hawking, PA-C 01/02/21 3785

## 2021-01-31 ENCOUNTER — Other Ambulatory Visit: Payer: Self-pay | Admitting: Student

## 2021-01-31 ENCOUNTER — Other Ambulatory Visit: Payer: Self-pay | Admitting: Internal Medicine

## 2021-02-02 ENCOUNTER — Encounter: Payer: Self-pay | Admitting: Internal Medicine

## 2021-02-02 NOTE — Telephone Encounter (Signed)
Patient mailed letter asking to get in contact with the clinic to schedule an appointment.

## 2021-02-02 NOTE — Telephone Encounter (Signed)
Last OV was 12/2019.  Pt was called to schedule an appt; no answer, message left to call the office. I will ask front office to call pt also. 

## 2021-02-02 NOTE — Telephone Encounter (Signed)
Last OV was 12/2019.  Pt was called to schedule an appt; no answer, message left to call the office. I will ask front office to call pt also.

## 2021-02-07 ENCOUNTER — Encounter (HOSPITAL_COMMUNITY): Payer: Self-pay

## 2021-02-07 ENCOUNTER — Emergency Department (HOSPITAL_BASED_OUTPATIENT_CLINIC_OR_DEPARTMENT_OTHER): Payer: 59

## 2021-02-07 ENCOUNTER — Ambulatory Visit (HOSPITAL_COMMUNITY)
Admission: EM | Admit: 2021-02-07 | Discharge: 2021-02-07 | Disposition: A | Discharge status: Home / Self Care | Payer: 59 | Attending: Medical Oncology | Admitting: Medical Oncology

## 2021-02-07 ENCOUNTER — Emergency Department (HOSPITAL_BASED_OUTPATIENT_CLINIC_OR_DEPARTMENT_OTHER)
Admission: EM | Admit: 2021-02-07 | Discharge: 2021-02-07 | Disposition: A | Discharge status: Home / Self Care | Payer: 59 | Attending: Emergency Medicine | Admitting: Emergency Medicine

## 2021-02-07 ENCOUNTER — Encounter (HOSPITAL_BASED_OUTPATIENT_CLINIC_OR_DEPARTMENT_OTHER): Payer: Self-pay

## 2021-02-07 ENCOUNTER — Other Ambulatory Visit: Payer: Self-pay

## 2021-02-07 DIAGNOSIS — Z79899 Other long term (current) drug therapy: Secondary | ICD-10-CM | POA: Diagnosis not present

## 2021-02-07 DIAGNOSIS — I10 Essential (primary) hypertension: Secondary | ICD-10-CM | POA: Insufficient documentation

## 2021-02-07 DIAGNOSIS — R1013 Epigastric pain: Secondary | ICD-10-CM | POA: Insufficient documentation

## 2021-02-07 DIAGNOSIS — R630 Anorexia: Secondary | ICD-10-CM | POA: Insufficient documentation

## 2021-02-07 DIAGNOSIS — R112 Nausea with vomiting, unspecified: Secondary | ICD-10-CM | POA: Diagnosis present

## 2021-02-07 DIAGNOSIS — Z789 Other specified health status: Secondary | ICD-10-CM | POA: Diagnosis present

## 2021-02-07 DIAGNOSIS — R197 Diarrhea, unspecified: Secondary | ICD-10-CM

## 2021-02-07 DIAGNOSIS — K389 Disease of appendix, unspecified: Secondary | ICD-10-CM

## 2021-02-07 DIAGNOSIS — Z90711 Acquired absence of uterus with remaining cervical stump: Secondary | ICD-10-CM | POA: Diagnosis present

## 2021-02-07 DIAGNOSIS — Z7982 Long term (current) use of aspirin: Secondary | ICD-10-CM | POA: Diagnosis not present

## 2021-02-07 DIAGNOSIS — Z3201 Encounter for pregnancy test, result positive: Secondary | ICD-10-CM | POA: Diagnosis present

## 2021-02-07 LAB — COMPREHENSIVE METABOLIC PANEL
ALT: 9 U/L (ref 0–44)
AST: 11 U/L — ABNORMAL LOW (ref 15–41)
Albumin: 4 g/dL (ref 3.5–5.0)
Alkaline Phosphatase: 51 U/L (ref 38–126)
Anion gap: 10 (ref 5–15)
BUN: 17 mg/dL (ref 6–20)
CO2: 26 mmol/L (ref 22–32)
Calcium: 9.4 mg/dL (ref 8.9–10.3)
Chloride: 106 mmol/L (ref 98–111)
Creatinine, Ser: 0.69 mg/dL (ref 0.44–1.00)
GFR, Estimated: >60 mL/min (ref >60)
Glucose, Bld: 80 mg/dL (ref 70–99)
Potassium: 3.8 mmol/L (ref 3.5–5.1)
Sodium: 142 mmol/L (ref 135–145)
Total Bilirubin: 0.4 mg/dL (ref 0.3–1.2)
Total Protein: 7.1 g/dL (ref 6.5–8.1)

## 2021-02-07 LAB — CBC WITH DIFFERENTIAL/PLATELET
Abs Immature Granulocytes: 0.01 10*3/uL (ref 0.00–0.07)
Basophils Absolute: 0.1 10*3/uL (ref 0.0–0.1)
Basophils Relative: 1 %
Eosinophils Absolute: 0.2 10*3/uL (ref 0.0–0.5)
Eosinophils Relative: 3 %
HCT: 37.3 % (ref 36.0–46.0)
Hemoglobin: 12.1 g/dL (ref 12.0–15.0)
Immature Granulocytes: 0 %
Lymphocytes Relative: 27 %
Lymphs Abs: 1.9 10*3/uL (ref 0.7–4.0)
MCH: 30 pg (ref 26.0–34.0)
MCHC: 32.4 g/dL (ref 30.0–36.0)
MCV: 92.3 fL (ref 80.0–100.0)
Monocytes Absolute: 0.6 10*3/uL (ref 0.1–1.0)
Monocytes Relative: 9 %
Neutro Abs: 4.1 10*3/uL (ref 1.7–7.7)
Neutrophils Relative %: 60 %
Platelets: 333 10*3/uL (ref 150–400)
RBC: 4.04 MIL/uL (ref 3.87–5.11)
RDW: 14.3 % (ref 11.5–15.5)
WBC: 6.9 10*3/uL (ref 4.0–10.5)
nRBC: 0 % (ref 0.0–0.2)

## 2021-02-07 LAB — POCT URINALYSIS DIPSTICK, ED / UC
Bilirubin Urine: NEGATIVE
Glucose, UA: NEGATIVE mg/dL
Ketones, ur: NEGATIVE mg/dL
Leukocytes,Ua: NEGATIVE
Nitrite: POSITIVE — AB
Protein, ur: NEGATIVE mg/dL
Specific Gravity, Urine: 1.025 (ref 1.005–1.030)
Urobilinogen, UA: 0.2 mg/dL (ref 0.0–1.0)
pH: 5.5 (ref 5.0–8.0)

## 2021-02-07 LAB — POC URINE PREG, ED: Preg Test, Ur: POSITIVE — AB

## 2021-02-07 LAB — HCG, QUANTITATIVE, PREGNANCY: hCG, Beta Chain, Quant, S: 14 m[IU]/mL — ABNORMAL HIGH (ref <5)

## 2021-02-07 LAB — LIPASE, BLOOD: Lipase: 24 U/L (ref 11–51)

## 2021-02-07 MED ORDER — ONDANSETRON 4 MG PO TBDP: 4.0000 mg | ORAL_TABLET | Freq: Three times a day (TID) | ORAL | 0 refills | Status: DC | PRN

## 2021-02-07 MED ORDER — SUCRALFATE 1 G PO TABS: 1.0000 g | ORAL_TABLET | Freq: Three times a day (TID) | ORAL | 0 refills | Status: DC

## 2021-02-07 MED ORDER — IOHEXOL 350 MG/ML SOLN
75.0000 mL | Freq: Once | INTRAVENOUS | Status: AC | PRN
  Administered 2021-02-07: 75 mL via INTRAVENOUS

## 2021-02-07 MED ORDER — CEPHALEXIN 500 MG PO CAPS: 500.0000 mg | ORAL_CAPSULE | Freq: Four times a day (QID) | ORAL | 0 refills | Status: DC

## 2021-02-07 MED ORDER — OMEPRAZOLE 20 MG PO CPDR: 20.0000 mg | DELAYED_RELEASE_CAPSULE | Freq: Every day | ORAL | 0 refills | Status: DC

## 2021-02-07 NOTE — Discharge Instructions (Addendum)
You are seen in the ER for abdominal discomfort, poor appetite. CT scan reveals enlarged appendix, otherwise reassuring.  As discussed, general surgery team recommends close follow-up with their service.  Please call the number provided for an appointment as soon as possible.  We still want you to see your gynecologist as per the request of the urgent care given the nonspecific incidental finding of slightly elevated hCG.  Finally, return to the ER immediately if you start having worsening abdominal pain, nausea, vomiting, fevers, chills.  Read the instructions on appendicitis to get an idea of what to expect in case your symptoms were to get worse.

## 2021-02-07 NOTE — ED Triage Notes (Signed)
Pt presents with intermittent abdominal discomfort with some diarrhea, nausea, and vomiting for past few weeks.

## 2021-02-07 NOTE — Discharge Instructions (Addendum)
Please follow up with Gastroenterology and OB-GYN Please start a prenatal vitamin

## 2021-02-07 NOTE — ED Triage Notes (Signed)
She c/o intermittent upper abd. Discomfort, plus occasional vomiting (isolated episodes) and occasional diarrhea (also isolated episodes) + anorexia x 2 weeks. She is ambulatory and in no distress. She was seen at [facility] today.

## 2021-02-07 NOTE — ED Provider Notes (Signed)
[facility] EMERGENCY DEPT Provider Note   CSN: [id number] Arrival date & time: 02/07/21  1208     History Chief Complaint  Patient presents with   Abdominal Pain    [name] is a 55 y.o. female.  Patient is a 55 year old female with a history of hypertension, migraines and prior fibroids status post hysterectomy who is presenting today from urgent care for further evaluation.  Patient reports for the last 2 weeks she has had anorexia, intermittent vomiting and diarrhea maybe 2-3 times per week for the last few weeks and just not feeling herself.  She has been generally tired and having epigastric abdominal pain.  She denies any urinary symptoms such as dysuria, frequency or urgency.  She denies any fever.  No recent travel or medication changes.  She does not take NSAIDs or drink alcohol or use any drugs including marijuana.  She is not sure if she has been losing weight but reports she normally weighs about 140 pounds.  She has had issues with her stomach before but has not been having any recent problems.  At urgent care she was told she had a urinary tract infection and that her urine pregnancy test was positive so was sent here for further evaluation.  The history is provided by the patient and medical records.  Abdominal Pain     Past Medical History:  Diagnosis Date   Hypertension    Migraines     Patient Active Problem List   Diagnosis Date Noted   Situational anxiety 12/21/2019   Migraines 11/20/2019   Hypertension 11/20/2019   Cough 11/20/2019   Tachycardia 11/20/2019    Past Surgical History:  Procedure Laterality Date   ABDOMINAL HYSTERECTOMY     CHOLECYSTECTOMY     CYST EXCISION       OB History   No obstetric history on file.     Family History  Problem Relation Age of Onset   Hypertension Mother    Hypertension Father     Social History   Tobacco Use   Smoking status: Never   Smokeless tobacco: Never  Vaping Use    Vaping Use: Never used  Substance Use Topics   Alcohol use: No   Drug use: No    Home Medications Prior to Admission medications   Medication Sig Start Date End Date Taking? Authorizing Provider  acetaminophen (TYLENOL) 500 MG tablet Take 1,000 mg by mouth every 6 (six) hours as needed for mild pain.    [provider]  aspirin-acetaminophen-caffeine (MIGRAINE RELIEF) [phone] MG tablet Take 2 tablets by mouth every 6 (six) hours as needed for headache or migraine.    [provider]  atenolol (TENORMIN) 100 MG tablet Take 1 tablet by mouth once daily 02/02/21   Adron Bene, MD  baclofen (LIORESAL) 10 MG tablet Take 1 tablet (10 mg total) by mouth 2 (two) times daily as needed for muscle spasms. 01/02/21   Raspet, Noberto Retort, PA-C  cephALEXin (KEFLEX) 500 MG capsule Take 1 capsule (500 mg total) by mouth 4 (four) times daily. 02/07/21   Rushie Chestnut, PA-C  ibuprofen (ADVIL) 800 MG tablet Take 1 tablet (800 mg total) by mouth every 8 (eight) hours as needed. 03/30/20   Mickie Bail, NP  Melatonin 3 MG TABS Take 3 mg by mouth as needed (sleep).    [provider]  Multiple Vitamins-Minerals (ONE-A-DAY WOMENS 50 PLUS) TABS Take 1 tablet by mouth 3 (three) times a week.  [provider]  PARoxetine (PAXIL) 10 MG tablet Take 1 tablet by mouth once daily 02/02/21   Adron Bene, MD    Allergies    Codeine  Review of Systems   Review of Systems  Gastrointestinal:  Positive for abdominal pain.  All other systems reviewed and are negative.  Physical Exam Updated Vital Signs BP (!) 146/83 (BP Location: Right Arm)   Pulse 82   Temp 98.3 F (36.8 C) (Oral)   Resp 18   Ht 5\' 6"  (1.676 m)   Wt 63.5 kg   SpO2 100%   BMI 22.60 kg/m   Physical Exam Vitals and nursing note reviewed.  Constitutional:      General: She is not in acute distress.    Appearance: Normal appearance. She is well-developed and normal weight.  HENT:     Head:  Normocephalic and atraumatic.     Mouth/Throat:     Mouth: Mucous membranes are moist.  Eyes:     Pupils: Pupils are equal, round, and reactive to light.  Cardiovascular:     Rate and Rhythm: Normal rate and regular rhythm.     Heart sounds: Normal heart sounds. No murmur heard.   No friction rub.  Pulmonary:     Effort: Pulmonary effort is normal.     Breath sounds: Normal breath sounds. No wheezing or rales.  Abdominal:     General: Abdomen is flat. Bowel sounds are normal. There is no distension.     Palpations: Abdomen is soft.     Tenderness: There is abdominal tenderness in the right lower quadrant and epigastric area. There is no guarding or rebound.  Musculoskeletal:        General: No tenderness. Normal range of motion.     Cervical back: Normal range of motion and neck supple.     Right lower leg: No edema.     Left lower leg: No edema.     Comments: No edema  Skin:    General: Skin is warm and dry.     Findings: No rash.  Neurological:     General: No focal deficit present.     Mental Status: She is alert and oriented to person, place, and time. Mental status is at baseline.     Cranial Nerves: No cranial nerve deficit.  Psychiatric:        Mood and Affect: Mood normal.        Behavior: Behavior normal.    ED Results / Procedures / Treatments   Labs (all labs ordered are listed, but only abnormal results are displayed) Labs Reviewed  CBC WITH DIFFERENTIAL/PLATELET  COMPREHENSIVE METABOLIC PANEL  LIPASE, BLOOD    EKG None  Radiology No results found.  Procedures Procedures   Medications Ordered in ED Medications - No data to display  ED Course  I have reviewed the triage vital signs and the nursing notes.  Pertinent labs & imaging results that were available during my care of the patient were reviewed by me and considered in my medical decision making (see chart for details).    MDM Rules/Calculators/A&P                           Patient is a  55 year old female presenting today with 2 weeks of anorexia and intermittent nausea and vomiting.  Patient is not having specific symptoms concerning for obstruction at this time.  She does have some epigastric discomfort but no peritoneal signs.  She does have a history of fibroids and status post hysterectomy but still has her ovaries.  She was seen in urgent care earlier today and told she had a UTI but also that her urine pregnancy test was positive and they sent her here for further care.  Patient denies any vaginal discharge or bleeding and does not have a uterus.  Also she denies any urinary symptoms at this time.  Concern for possible mass versus colitis versus diverticulitis.  She has seen GI and had colonoscopies in the past where she has been diagnosed with colitis.  No recent antibiotic use or travel outside the [place].  Labs and CT pending Final Clinical Impression(s) / ED Diagnoses Final diagnoses:  None    Rx / DC Orders ED Discharge Orders     None        Gwyneth Sprout, MD 02/07/21 [phone]

## 2021-02-07 NOTE — ED Provider Notes (Addendum)
  Physical Exam  BP 138/81   Pulse 73   Temp 98.3 F (36.8 C) (Oral)   Resp 16   Ht 5\' 6"  (1.676 m)   Wt 63.5 kg   SpO2 100%   BMI 22.60 kg/m   Physical Exam  ED Course/Procedures   Clinical Course as of 02/07/21 1703  Sat Feb 07, 2021  1657 CT ABDOMEN PELVIS W CONTRAST Patient reassessed after the CT finding.  On exam she did have focal tenderness over the right lower quadrant, negative McBurney's, negative Rovsing's and no rebound tenderness.  Labs reviewed with the patient as well.  I decided to call Dr. Feb 09, 2021.  We discussed patient's presenting symptoms, slight elevated beta-hCG, concerns for possible cancer and therefore the order of CT scan and the incidental finding of enlarged appendix with fecalith.   Given the reassuring exam and presentation from appendicitis perspective, Dr. Derrell Lolling recommends close outpatient follow-up by their service.  He also wants Derrell Lolling to discuss strict ER return precaution in case patient's symptoms worsen.  I also mentioned to Dr. [place] that her symptoms are early satiety, questionable weight loss, slightly elevated beta-hCG -all of it was concerning for possible tumor/cancer-and therefore also consider appendiceal cancer /carcinoid tumor in the differential.  He thinks the neck step would be still the same, close outpatient follow-up, and discussion to have elective appendectomy.  Finally, he does not think antibiotics would be helpful.   Patient has been advised to come back to the ER if she starts having worsening abdominal pain, fevers, nausea, vomiting.   [AN]    Clinical Course User Index [AN] Derrell Lolling, MD    Procedures  MDM   Assuming care of patient from Dr. Derwood Kaplan   Patient in the ED for nonspecific n/v and abd discomfort. Workup thus far shows normal labs.   Concerning findings are as following : HCG at UC was positive slightly. Important pending results are Ct scan.  According to Dr. Anitra Lauth, plan is to f/u on CT  and discuss the findings.  Exam is reassuring. Anticipate d/c to PCP/GI.   Patient had no complains, no concerns from the nursing side. Will continue to monitor.        Anitra Lauth, MD 02/07/21 1524    02/09/21, MD 02/07/21 [phone]

## 2021-02-07 NOTE — ED Provider Notes (Addendum)
[facility]    CSN: [id number] Arrival date & time: 02/07/21  1006      History   Chief Complaint Chief Complaint  Patient presents with   Abdominal Pain   Diarrhea   Emesis    HPI [name] is a 55 y.o. female.   HPI  Abdominal Pain: Patient reports that she has had GI issues for many years.  She states that in the 90s she was diagnosed with colitis however she has not had any flares anytime recently.  She states that she is not having any bloody movements or passing any mucousy stools.  Her last colonoscopy will was many years ago.  She reports that recently over the past few weeks she has had some trouble with eating.  She states that her appetite is low and that when she does eat she usually vomits the food or has diarrhea afterwards.  She states that she is not having diarrhea or vomiting if she does not eat.  She is able to tolerate liquids including Ensure shakes. Similar to past pregnancies. Mild epigastric discomfort at times. No pelvic pain. She has tried Zofran with some improvement.  She has had some night sweats but attributes this to menopause. No fevers, dysuria, recent ABX use, recent hospitalization. She denies ETOH, marijuana, history of DM.    Past Medical History:  Diagnosis Date   Hypertension    Migraines     Patient Active Problem List   Diagnosis Date Noted   Situational anxiety 12/21/2019   Migraines 11/20/2019   Hypertension 11/20/2019   Cough 11/20/2019   Tachycardia 11/20/2019    Past Surgical History:  Procedure Laterality Date   ABDOMINAL HYSTERECTOMY     CHOLECYSTECTOMY     CYST EXCISION      OB History   No obstetric history on file.      Home Medications    Prior to Admission medications   Medication Sig Start Date End Date Taking? Authorizing Provider  acetaminophen (TYLENOL) 500 MG tablet Take 1,000 mg by mouth every 6 (six) hours as needed for mild pain.    [provider]   aspirin-acetaminophen-caffeine (MIGRAINE RELIEF) [phone] MG tablet Take 2 tablets by mouth every 6 (six) hours as needed for headache or migraine.    [provider]  atenolol (TENORMIN) 100 MG tablet Take 1 tablet by mouth once daily 02/02/21   Adron Bene, MD  baclofen (LIORESAL) 10 MG tablet Take 1 tablet (10 mg total) by mouth 2 (two) times daily as needed for muscle spasms. 01/02/21   Raspet, Noberto Retort, PA-C  ibuprofen (ADVIL) 800 MG tablet Take 1 tablet (800 mg total) by mouth every 8 (eight) hours as needed. 03/30/20   Mickie Bail, NP  Melatonin 3 MG TABS Take 3 mg by mouth as needed (sleep).    [provider]  Multiple Vitamins-Minerals (ONE-A-DAY WOMENS 50 PLUS) TABS Take 1 tablet by mouth 3 (three) times a week.    [provider]  ondansetron (ZOFRAN ODT) 4 MG disintegrating tablet Take 1 tablet (4 mg total) by mouth every 8 (eight) hours as needed for nausea or vomiting. 01/02/21   Raspet, Noberto Retort, PA-C  PARoxetine (PAXIL) 10 MG tablet Take 1 tablet by mouth once daily 02/02/21   Adron Bene, MD    Family History Family History  Problem Relation Age of Onset   Hypertension Mother    Hypertension Father     Social History Social History  Tobacco Use   Smoking status: Never   Smokeless tobacco: Never  Vaping Use   Vaping Use: Never used  Substance Use Topics   Alcohol use: No   Drug use: No     Allergies   Codeine   Review of Systems Review of Systems  As stated above in HPI Physical Exam Triage Vital Signs ED Triage Vitals  Enc Vitals Group     BP 02/07/21 1033 134/73     Pulse Rate 02/07/21 1033 93     Resp 02/07/21 1033 17     Temp 02/07/21 1033 99.3 F (37.4 C)     Temp Source 02/07/21 1033 Oral     SpO2 02/07/21 1033 100 %     Weight --      Height --      Head Circumference --      Peak Flow --      Pain Score 02/07/21 1035 5     Pain Loc --      Pain Edu? --      Excl. in GC? --    No data  found.  Updated Vital Signs BP 134/73 (BP Location: Right Arm)   Pulse 93   Temp 99.3 F (37.4 C) (Oral)   Resp 17   SpO2 100%   Physical Exam Vitals and nursing note reviewed.  Constitutional:      General: She is not in acute distress.    Appearance: She is well-developed. She is not ill-appearing, toxic-appearing or diaphoretic.  HENT:     Head: Normocephalic and atraumatic.     Mouth/Throat:     Mouth: Mucous membranes are moist.  Eyes:     Extraocular Movements: Extraocular movements intact.     Pupils: Pupils are equal, round, and reactive to light.  Cardiovascular:     Rate and Rhythm: Normal rate and regular rhythm.     Heart sounds: Normal heart sounds.  Pulmonary:     Effort: Pulmonary effort is normal.     Breath sounds: Normal breath sounds.  Abdominal:     General: Abdomen is flat. Bowel sounds are normal. There is no distension.     Palpations: Abdomen is soft. There is no hepatomegaly, splenomegaly or mass.     Tenderness: There is abdominal tenderness (mild) in the epigastric area. There is no right CVA tenderness, left CVA tenderness, guarding or rebound. Negative signs include Murphy's sign and McBurney's sign.     Hernia: No hernia is present.  Skin:    General: Skin is warm.     Coloration: Skin is not cyanotic or jaundiced.  Neurological:     Mental Status: She is alert and oriented to person, place, and time.     UC Treatments / Results  Labs (all labs ordered are listed, but only abnormal results are displayed) Labs Reviewed - No data to display  EKG   Radiology No results found.  Procedures Procedures (including critical care time)  Medications Ordered in UC Medications - No data to display  Initial Impression / Assessment and Plan / UC Course  I have reviewed the triage vital signs and the nursing notes.  Pertinent labs & imaging results that were available during my care of the patient were reviewed by me and considered in my  medical decision making (see chart for details).     New.  Assessing a urinalysis which is positive. Urine HCG was positive given symptoms similar to morning sickness. Repeat urine HCG was positive. Obtaining  quantitative HCG. Discussed with attending Joni Reining at [facility] and we reviewed patient's chart.  We have notes that show that she has had a partial hysterectomy with removal of her uterus and cervix.  For this reason in particular they have recommended that she be seen at the regular emergency room as they believe that symptoms are related to an abdominal mass vs GYN concern.  I personally called the Charge nurse at the ER of patients choice who is aware of patients upcoming arrival and complex history. Patient elects private vehicle transfer.   Final Clinical Impressions(s) / UC Diagnoses   Final diagnoses:  None   Discharge Instructions   None    ED Prescriptions   None    PDMP not reviewed this encounter.   Rushie Chestnut, PA-C 02/07/21 1059    [address], PA-C 02/07/21 1144    Rushie Chestnut, PA-C 02/07/21 1203

## 2021-02-07 NOTE — ED Notes (Signed)
Patient is being discharged from the Urgent Care and sent to the Emergency Department via POV . Per Fortino Sic, PA, patient is in need of higher level of care due to severe abdominal pain with positive pregnancy with hx of hysterectomy. Patient is aware and verbalizes understanding of plan of care.  Vitals:   02/07/21 1033  BP: 134/73  Pulse: 93  Resp: 17  Temp: 99.3 F (37.4 C)  SpO2: 100%

## 2021-02-09 ENCOUNTER — Other Ambulatory Visit: Payer: Self-pay

## 2021-02-09 ENCOUNTER — Ambulatory Visit (INDEPENDENT_AMBULATORY_CARE_PROVIDER_SITE_OTHER): Payer: 59 | Admitting: Student

## 2021-02-09 ENCOUNTER — Encounter: Payer: Self-pay | Admitting: Internal Medicine

## 2021-02-09 VITALS — BP 151/84 | HR 73 | Temp 99.4°F | Ht 66.0 in | Wt 132.2 lb

## 2021-02-09 DIAGNOSIS — R197 Diarrhea, unspecified: Secondary | ICD-10-CM | POA: Diagnosis not present

## 2021-02-09 DIAGNOSIS — R1013 Epigastric pain: Secondary | ICD-10-CM

## 2021-02-09 DIAGNOSIS — I10 Essential (primary) hypertension: Secondary | ICD-10-CM

## 2021-02-09 DIAGNOSIS — Z1211 Encounter for screening for malignant neoplasm of colon: Secondary | ICD-10-CM

## 2021-02-09 DIAGNOSIS — Z1231 Encounter for screening mammogram for malignant neoplasm of breast: Secondary | ICD-10-CM | POA: Diagnosis not present

## 2021-02-09 DIAGNOSIS — R109 Unspecified abdominal pain: Secondary | ICD-10-CM | POA: Insufficient documentation

## 2021-02-09 DIAGNOSIS — F418 Other specified anxiety disorders: Secondary | ICD-10-CM

## 2021-02-09 DIAGNOSIS — Z780 Asymptomatic menopausal state: Secondary | ICD-10-CM | POA: Diagnosis not present

## 2021-02-09 DIAGNOSIS — R7989 Other specified abnormal findings of blood chemistry: Secondary | ICD-10-CM

## 2021-02-09 DIAGNOSIS — E349 Endocrine disorder, unspecified: Secondary | ICD-10-CM | POA: Insufficient documentation

## 2021-02-09 MED ORDER — PAROXETINE HCL 10 MG PO TABS: 10.0000 mg | ORAL_TABLET | Freq: Every day | ORAL | 2 refills | Status: DC

## 2021-02-09 MED ORDER — ATENOLOL 100 MG PO TABS: 100.0000 mg | ORAL_TABLET | Freq: Every day | ORAL | 2 refills | Status: DC

## 2021-02-09 NOTE — Assessment & Plan Note (Signed)
Refill Paxil 

## 2021-02-09 NOTE — Progress Notes (Signed)
   CC: ED follow-up  HPI:  Ms.Brucha Kathie Rhodes Varma is a 55 y.o. with past medical history of hypertension, anxiety who presented to the clinic for recent ED follow-up.  Please see problem based charting for details  Past Medical History:  Diagnosis Date   Hypertension    Migraines    Review of Systems: Per HPI  Physical Exam:  Vitals:   02/09/21 1131  BP: (!) 151/84  Pulse: 73  Temp: 99.4 F (37.4 C)  TempSrc: Oral  SpO2: 100%  Weight: 132 lb 3.2 oz (60 kg)  Height: 5\' 6"  (1.676 m)   Physical Exam Constitutional:      General: She is not in acute distress.    Appearance: She is not toxic-appearing.  HENT:     Head: Normocephalic.  Eyes:     Conjunctiva/sclera: Conjunctivae normal.  Cardiovascular:     Rate and Rhythm: Normal rate and regular rhythm.     Heart sounds: Normal heart sounds.  Pulmonary:     Effort: Pulmonary effort is normal. No respiratory distress.     Breath sounds: Normal breath sounds. No wheezing.  Abdominal:     Comments: Abdomen soft, nondistended Mild tenderness to palpation epigastric and right lower quadrant at McBurney point.  No guarding.  No hematoma or bruises seen in the overlying skin. Normal bowel sound  Musculoskeletal:        General: Normal range of motion.     Cervical back: Normal range of motion.  Skin:    General: Skin is warm.     Coloration: Skin is not jaundiced.  Neurological:     Mental Status: She is alert and oriented to person, place, and time.  Psychiatric:        Mood and Affect: Mood normal.        Behavior: Behavior normal.     Assessment & Plan:   See Encounters Tab for problem based charting.  Patient discussed with Dr. 

## 2021-02-09 NOTE — Patient Instructions (Addendum)
[name]  It was a pleasure seeing you in the clinic today.  I am sorry not feeling well in the last few weeks.  Please follow-up with the surgeon as scheduled.  They would decide if you need to have your appendix removed.  I will check blood work and stool test to evaluate for other causes of your diarrhea.  Please obtain Pepcid over-the-counter for your abdominal pain.  You can also take Tums as needed.  I also sent out  referrals for colonoscopy and mammogram.  I will recheck your beta-hCG today.  Please return in 2 weeks,  Take care,  Dr. Cyndie Chime

## 2021-02-09 NOTE — Assessment & Plan Note (Addendum)
Patient was recently seen in the ED for epigastric pain with occasional nausea/vomiting and diarrhea.  CBC and CMP were obtained which came back normal.  CT abdomen pelvis showed appendicolith without abscess or perforation.  Beta-hCG test came back positive at 14.  She was discharged from the ED and advised to follow-up with PCP and surgery outpatient.  Today patient report persistent nauseous feeling.  Also report epigastric pain and right lower quadrant pain to palpation.  Reports diarrhea.  States that the symptom has been going on for few weeks.  States that she was able to tolerate p.o. intake.  She only had vomiting and diarrhea occasionally.  She does not know the trigger of her symptoms.  Denies blood in the stool.  Denies heavy alcohol drinking.  Also reported poor appetite and weight loss.  She currently taking Ensure to keep up with p.o. intake.  Physical exam reveals mild tenderness to palpation of the epigastric and right lower quadrant.  No guarding.  Bowel sounds normal.  Assessment and plan I am not sure if the appendicolith is causing all of the symptoms.  CT abdomen showed normal pancreas, liver or kidneys.  No signs of diverticulitis.  She has had a prior cholecystectomy.  CMP and CMP did not show any sign of dehydration or electrolyte derangement from vomiting or diarrhea.    - Fortunately, patient has an appointment with [facility] on 9/7 for discussion about possible appendectomy. - With epigastric pain, nausea and vomiting, will trial Pepcid and Tums for suspecting GERD. - For chronic nonbloody diarrhea, will order fecal elastase to and TTG to rule out pancreatic insufficiencies or celiac disease. -Referral to colonoscopy and mammogram to rule out potential cancer can cause weight loss. -Follow-up in 2 weeks  Addendum Celiac panel came back negative

## 2021-02-09 NOTE — Assessment & Plan Note (Signed)
Elevated 151/84.  Currently taking atenolol 100 mg daily for anxiety.  Would not adjust her medication in the setting of acute illness.  -Follow-up in 2 weeks.  Can adjust regimen if remains elevated

## 2021-02-09 NOTE — Assessment & Plan Note (Addendum)
Beta-hCG quant checked in the ED was 14.  Patient history of prior hysterectomy.  CT abdomen/pelvis did not show any adnexal mass or free pelvic fluid.  Assessment and plan Serum beta-hCG can be slightly elevated in menopausal women, commonly between 7 to 15.  Other causes including ectopic pregnancy or adnexal tumor, which are lower on suspicion list.  -Repeat beta-hCG today  Addendum Beta HCG came back 11. Repeat HCG at next visit

## 2021-02-10 LAB — GLIA (IGA/G) + TTG IGA
Antigliadin Abs, IgA: 2 units (ref 0–19)
Gliadin IgG: 2 units (ref 0–19)
Transglutaminase IgA: <2 U/mL (ref 0–3)

## 2021-02-10 LAB — BETA HCG QUANT (REF LAB): hCG Quant: 11 m[IU]/mL

## 2021-02-12 NOTE — Addendum Note (Signed)
Addended byDoran Stabler on: 02/12/2021 09:07 AM   Modules accepted: Level of Service

## 2021-02-12 NOTE — Progress Notes (Signed)
Internal Medicine Clinic Attending  Case discussed with Dr. Nguyen  At the time of the visit.  We reviewed the resident's history and exam and pertinent patient test results.  I agree with the assessment, diagnosis, and plan of care documented in the resident's note. 

## 2021-02-18 ENCOUNTER — Inpatient Hospital Stay (HOSPITAL_BASED_OUTPATIENT_CLINIC_OR_DEPARTMENT_OTHER)
Admission: EM | Admit: 2021-02-18 | Discharge: 2021-02-19 | DRG: 343 | Disposition: A | Discharge status: Home / Self Care | Payer: 59 | Attending: Surgery | Admitting: Surgery

## 2021-02-18 ENCOUNTER — Encounter (HOSPITAL_BASED_OUTPATIENT_CLINIC_OR_DEPARTMENT_OTHER): Payer: Self-pay

## 2021-02-18 ENCOUNTER — Other Ambulatory Visit: Payer: Self-pay

## 2021-02-18 DIAGNOSIS — K660 Peritoneal adhesions (postprocedural) (postinfection): Secondary | ICD-10-CM | POA: Diagnosis present

## 2021-02-18 DIAGNOSIS — Z8249 Family history of ischemic heart disease and other diseases of the circulatory system: Secondary | ICD-10-CM

## 2021-02-18 DIAGNOSIS — K381 Appendicular concretions: Secondary | ICD-10-CM | POA: Diagnosis present

## 2021-02-18 DIAGNOSIS — R109 Unspecified abdominal pain: Secondary | ICD-10-CM | POA: Diagnosis present

## 2021-02-18 DIAGNOSIS — I10 Essential (primary) hypertension: Secondary | ICD-10-CM | POA: Diagnosis present

## 2021-02-18 DIAGNOSIS — Z79899 Other long term (current) drug therapy: Secondary | ICD-10-CM

## 2021-02-18 DIAGNOSIS — Z9049 Acquired absence of other specified parts of digestive tract: Secondary | ICD-10-CM

## 2021-02-18 DIAGNOSIS — K36 Other appendicitis: Secondary | ICD-10-CM | POA: Diagnosis present

## 2021-02-18 DIAGNOSIS — R1031 Right lower quadrant pain: Secondary | ICD-10-CM

## 2021-02-18 DIAGNOSIS — Z20822 Contact with and (suspected) exposure to covid-19: Secondary | ICD-10-CM | POA: Diagnosis present

## 2021-02-18 DIAGNOSIS — Z90711 Acquired absence of uterus with remaining cervical stump: Secondary | ICD-10-CM | POA: Diagnosis not present

## 2021-02-18 DIAGNOSIS — K37 Unspecified appendicitis: Secondary | ICD-10-CM | POA: Diagnosis present

## 2021-02-18 LAB — CBC WITH DIFFERENTIAL/PLATELET
Abs Immature Granulocytes: 0.03 10*3/uL (ref 0.00–0.07)
Basophils Absolute: 0.1 10*3/uL (ref 0.0–0.1)
Basophils Relative: 1 %
Eosinophils Absolute: 0.2 10*3/uL (ref 0.0–0.5)
Eosinophils Relative: 2 %
HCT: 35.2 % — ABNORMAL LOW (ref 36.0–46.0)
Hemoglobin: 11.3 g/dL — ABNORMAL LOW (ref 12.0–15.0)
Immature Granulocytes: 0 %
Lymphocytes Relative: 23 %
Lymphs Abs: 1.6 10*3/uL (ref 0.7–4.0)
MCH: 30 pg (ref 26.0–34.0)
MCHC: 32.1 g/dL (ref 30.0–36.0)
MCV: 93.4 fL (ref 80.0–100.0)
Monocytes Absolute: 0.6 10*3/uL (ref 0.1–1.0)
Monocytes Relative: 9 %
Neutro Abs: 4.3 10*3/uL (ref 1.7–7.7)
Neutrophils Relative %: 65 %
Platelets: 396 10*3/uL (ref 150–400)
RBC: 3.77 MIL/uL — ABNORMAL LOW (ref 3.87–5.11)
RDW: 13.5 % (ref 11.5–15.5)
WBC: 6.8 10*3/uL (ref 4.0–10.5)
nRBC: 0 % (ref 0.0–0.2)

## 2021-02-18 LAB — URINALYSIS, ROUTINE W REFLEX MICROSCOPIC
Bilirubin Urine: NEGATIVE
Glucose, UA: NEGATIVE mg/dL
Hgb urine dipstick: NEGATIVE
Ketones, ur: NEGATIVE mg/dL
Leukocytes,Ua: NEGATIVE
Nitrite: POSITIVE — AB
Protein, ur: NEGATIVE mg/dL
Specific Gravity, Urine: 1.022 (ref 1.005–1.030)
pH: 6 (ref 5.0–8.0)

## 2021-02-18 LAB — COMPREHENSIVE METABOLIC PANEL
ALT: 8 U/L (ref 0–44)
AST: 11 U/L — ABNORMAL LOW (ref 15–41)
Albumin: 4.1 g/dL (ref 3.5–5.0)
Alkaline Phosphatase: 56 U/L (ref 38–126)
Anion gap: 8 (ref 5–15)
BUN: 17 mg/dL (ref 6–20)
CO2: 28 mmol/L (ref 22–32)
Calcium: 9.4 mg/dL (ref 8.9–10.3)
Chloride: 105 mmol/L (ref 98–111)
Creatinine, Ser: 0.8 mg/dL (ref 0.44–1.00)
GFR, Estimated: >60 mL/min (ref >60)
Glucose, Bld: 162 mg/dL — ABNORMAL HIGH (ref 70–99)
Potassium: 3.5 mmol/L (ref 3.5–5.1)
Sodium: 141 mmol/L (ref 135–145)
Total Bilirubin: 0.4 mg/dL (ref 0.3–1.2)
Total Protein: 7.3 g/dL (ref 6.5–8.1)

## 2021-02-18 LAB — RESP PANEL BY RT-PCR (FLU A&B, COVID) ARPGX2
Influenza A by PCR: NEGATIVE
Influenza B by PCR: NEGATIVE
SARS Coronavirus 2 by RT PCR: NEGATIVE

## 2021-02-18 LAB — CBC
HCT: 35.1 % — ABNORMAL LOW (ref 36.0–46.0)
Hemoglobin: 11.4 g/dL — ABNORMAL LOW (ref 12.0–15.0)
MCH: 30.1 pg (ref 26.0–34.0)
MCHC: 32.5 g/dL (ref 30.0–36.0)
MCV: 92.6 fL (ref 80.0–100.0)
Platelets: 382 10*3/uL (ref 150–400)
RBC: 3.79 MIL/uL — ABNORMAL LOW (ref 3.87–5.11)
RDW: 13.4 % (ref 11.5–15.5)
WBC: 6.9 10*3/uL (ref 4.0–10.5)
nRBC: 0 % (ref 0.0–0.2)

## 2021-02-18 LAB — PREGNANCY, URINE: Preg Test, Ur: NEGATIVE

## 2021-02-18 LAB — LIPASE, BLOOD: Lipase: 26 U/L (ref 11–51)

## 2021-02-18 LAB — HIV ANTIBODY (ROUTINE TESTING W REFLEX): HIV Screen 4th Generation wRfx: NONREACTIVE

## 2021-02-18 MED ORDER — MELATONIN 3 MG PO TABS: 3.0000 mg | ORAL_TABLET | Freq: Every evening | ORAL | Status: DC | PRN

## 2021-02-18 MED ORDER — ATENOLOL 25 MG PO TABS
100.0000 mg | ORAL_TABLET | Freq: Every day | ORAL | Status: DC
  Administered 2021-02-18 – 2021-02-19 (×2): 100 mg via ORAL
  Filled 2021-02-18 (×2): qty 4

## 2021-02-18 MED ORDER — METRONIDAZOLE 500 MG/100ML IV SOLN
500.0000 mg | Freq: Two times a day (BID) | INTRAVENOUS | Status: DC
  Administered 2021-02-18: 500 mg via INTRAVENOUS
  Filled 2021-02-18: qty 100

## 2021-02-18 MED ORDER — KETOROLAC TROMETHAMINE 30 MG/ML IJ SOLN
30.0000 mg | Freq: Once | INTRAMUSCULAR | Status: AC
  Administered 2021-02-18: 30 mg via INTRAVENOUS
  Filled 2021-02-18: qty 1

## 2021-02-18 MED ORDER — ACETAMINOPHEN 325 MG PO TABS: 650.0000 mg | ORAL_TABLET | Freq: Four times a day (QID) | ORAL | Status: DC | PRN

## 2021-02-18 MED ORDER — DIPHENHYDRAMINE HCL 50 MG/ML IJ SOLN: 12.5000 mg | Freq: Four times a day (QID) | INTRAMUSCULAR | Status: DC | PRN

## 2021-02-18 MED ORDER — ONDANSETRON HCL 4 MG/2ML IJ SOLN: 4.0000 mg | Freq: Once | INTRAMUSCULAR | Status: DC

## 2021-02-18 MED ORDER — METOCLOPRAMIDE HCL 5 MG/ML IJ SOLN
10.0000 mg | Freq: Once | INTRAMUSCULAR | Status: AC
  Administered 2021-02-18: 10 mg via INTRAVENOUS
  Filled 2021-02-18: qty 2

## 2021-02-18 MED ORDER — PAROXETINE HCL 10 MG PO TABS
10.0000 mg | ORAL_TABLET | Freq: Every day | ORAL | Status: DC
  Administered 2021-02-18 – 2021-02-19 (×2): 10 mg via ORAL
  Filled 2021-02-18 (×2): qty 1

## 2021-02-18 MED ORDER — SODIUM CHLORIDE 0.9 % IV SOLN
2.0000 g | INTRAVENOUS | Status: DC
  Administered 2021-02-18: 2 g via INTRAVENOUS
  Filled 2021-02-18 (×2): qty 20

## 2021-02-18 MED ORDER — SODIUM CHLORIDE 0.9 % IV BOLUS
500.0000 mL | Freq: Once | INTRAVENOUS | Status: AC
  Administered 2021-02-18: 500 mL via INTRAVENOUS

## 2021-02-18 MED ORDER — BISACODYL 10 MG RE SUPP: 10.0000 mg | Freq: Every day | RECTAL | Status: DC | PRN

## 2021-02-18 MED ORDER — METHOCARBAMOL 500 MG PO TABS: 500.0000 mg | ORAL_TABLET | Freq: Four times a day (QID) | ORAL | Status: DC | PRN

## 2021-02-18 MED ORDER — HYDROMORPHONE HCL 1 MG/ML IJ SOLN: 0.5000 mg | INTRAMUSCULAR | Status: DC | PRN

## 2021-02-18 MED ORDER — OXYCODONE HCL 5 MG PO TABS
5.0000 mg | ORAL_TABLET | ORAL | Status: DC | PRN
  Administered 2021-02-19 (×2): 5 mg via ORAL
  Filled 2021-02-18 (×2): qty 1

## 2021-02-18 MED ORDER — METOPROLOL TARTRATE 5 MG/5ML IV SOLN: 5.0000 mg | Freq: Four times a day (QID) | INTRAVENOUS | Status: DC | PRN

## 2021-02-18 MED ORDER — ACETAMINOPHEN 650 MG RE SUPP: 650.0000 mg | Freq: Four times a day (QID) | RECTAL | Status: DC | PRN

## 2021-02-18 MED ORDER — ONDANSETRON HCL 4 MG/2ML IJ SOLN
4.0000 mg | Freq: Four times a day (QID) | INTRAMUSCULAR | Status: DC | PRN
  Administered 2021-02-18: 4 mg via INTRAVENOUS
  Filled 2021-02-18: qty 2

## 2021-02-18 MED ORDER — DOCUSATE SODIUM 100 MG PO CAPS
100.0000 mg | ORAL_CAPSULE | Freq: Two times a day (BID) | ORAL | Status: DC
  Administered 2021-02-18 – 2021-02-19 (×2): 100 mg via ORAL
  Filled 2021-02-18 (×2): qty 1

## 2021-02-18 MED ORDER — POLYETHYLENE GLYCOL 3350 17 G PO PACK: 17.0000 g | PACK | Freq: Every day | ORAL | Status: DC | PRN

## 2021-02-18 MED ORDER — SIMETHICONE 80 MG PO CHEW: 40.0000 mg | CHEWABLE_TABLET | Freq: Four times a day (QID) | ORAL | Status: DC | PRN

## 2021-02-18 MED ORDER — ENOXAPARIN SODIUM 40 MG/0.4ML IJ SOSY
40.0000 mg | PREFILLED_SYRINGE | INTRAMUSCULAR | Status: DC
  Administered 2021-02-18: 40 mg via SUBCUTANEOUS
  Filled 2021-02-18: qty 0.4

## 2021-02-18 MED ORDER — ONDANSETRON 4 MG PO TBDP: 4.0000 mg | ORAL_TABLET | Freq: Four times a day (QID) | ORAL | Status: DC | PRN

## 2021-02-18 MED ORDER — DIPHENHYDRAMINE HCL 12.5 MG/5ML PO ELIX: 12.5000 mg | ORAL_SOLUTION | Freq: Four times a day (QID) | ORAL | Status: DC | PRN

## 2021-02-18 MED ORDER — SODIUM CHLORIDE 0.9 % IV SOLN: INTRAVENOUS | Status: DC

## 2021-02-18 NOTE — ED Notes (Signed)
Report called to April Giles with [facility].

## 2021-02-18 NOTE — ED Provider Notes (Signed)
[facility] EMERGENCY DEPT Provider Note   CSN: [id number] Arrival date & time: 02/18/21  1055     History Chief Complaint  Patient presents with   Abdominal Pain    [name] is a 55 y.o. female present emerged department abdominal pain, nausea, vomiting, headache.  Patient was seen in the emergency department approximately 2 weeks ago for right lower quadrant abdominal pain, to CT scan of the abdomen which was notable for an appendicolith without obvious signs of appendicitis.  The ED provider discussed with the general surgeon Dr Antonieta Pert and given the patient's overall clinical well appearance, it was decided that she can follow-up as an outpatient in the office.  Patient reports she has an appointment coming up this week, but came into the ED because she feels her symptoms are getting worse.  She reports she is not able to eat or drink anything, has no appetite, and has a pounding headache.  She had a regular bowel movement yesterday.  Denies diarrhea.  Denies fevers.  Reports a history of a partial hysterectomy and cholecystectomy in the past, no other abdominal surgeries.  IMPRESSION: 1. Interval development of proximal appendiceal fecalith which distends the appendix up to 10 millimeters. There is a small amount of fluid distal to the appendicolith. This portion of the appendix is also distended, up to 9 millimeters. The distal portion of the appendix measures 3-4 millimeters. Findings are suspicious for early appendicitis. 2. Prior cholecystectomy.  Hysterectomy. 3. Colonic diverticulosis without acute diverticulitis. 4. Moderate stool burden.      HPI     Past Medical History:  Diagnosis Date   Hypertension    Migraines     Patient Active Problem List   Diagnosis Date Noted   Appendicitis 02/18/2021   Abdominal pain 02/09/2021   Elevated serum hCG 02/09/2021   Situational anxiety 12/21/2019   Migraines 11/20/2019   Hypertension 11/20/2019    Cough 11/20/2019   Tachycardia 11/20/2019    Past Surgical History:  Procedure Laterality Date   ABDOMINAL HYSTERECTOMY     CHOLECYSTECTOMY     CYST EXCISION       OB History   No obstetric history on file.     Family History  Problem Relation Age of Onset   Hypertension Mother    Hypertension Father     Social History   Tobacco Use   Smoking status: Never   Smokeless tobacco: Never  Vaping Use   Vaping Use: Never used  Substance Use Topics   Alcohol use: No   Drug use: No    Home Medications Prior to Admission medications   Medication Sig Start Date End Date Taking? Authorizing Provider  acetaminophen (TYLENOL) 500 MG tablet Take 1,000 mg by mouth every 6 (six) hours as needed for mild pain.   Yes [provider]  atenolol (TENORMIN) 100 MG tablet Take 1 tablet (100 mg total) by mouth daily. 02/09/21  Yes Doran Stabler, DO  ibuprofen (ADVIL) 800 MG tablet Take 1 tablet (800 mg total) by mouth every 8 (eight) hours as needed. 03/30/20  Yes Mickie Bail, NP  Melatonin 3 MG TABS Take 3 mg by mouth as needed (sleep).   Yes [provider]  PARoxetine (PAXIL) 10 MG tablet Take 1 tablet (10 mg total) by mouth daily. 02/09/21  Yes Doran Stabler, DO  aspirin-acetaminophen-caffeine (EXCEDRIN MIGRAINE) [phone] MG tablet Take 2 tablets by mouth every 6 (six) hours as needed for headache or migraine.    [provider]  baclofen (LIORESAL) 10 MG tablet Take 1 tablet (10 mg total) by mouth 2 (two) times daily as needed for muscle spasms. 01/02/21   Raspet, Noberto Retort, PA-C  cephALEXin (KEFLEX) 500 MG capsule Take 1 capsule (500 mg total) by mouth 4 (four) times daily. Patient not taking: Reported on 02/18/2021 02/07/21   Rushie Chestnut, PA-C  Multiple Vitamins-Minerals (ONE-A-DAY WOMENS 50 PLUS) TABS Take 1 tablet by mouth 3 (three) times a week.    [provider]    Allergies    Codeine  Review of Systems   Review of Systems   Constitutional:  Negative for chills and fever.  HENT:  Negative for ear pain and sore throat.   Eyes:  Negative for pain and visual disturbance.  Respiratory:  Negative for cough and shortness of breath.   Cardiovascular:  Negative for chest pain and palpitations.  Gastrointestinal:  Positive for abdominal pain, nausea and vomiting. Negative for constipation and diarrhea.  Genitourinary:  Negative for dysuria and hematuria.  Musculoskeletal:  Negative for arthralgias and back pain.  Skin:  Negative for color change and rash.  Neurological:  Positive for headaches. Negative for syncope.  All other systems reviewed and are negative.  Physical Exam Updated Vital Signs BP 128/82   Pulse 74   Temp 98.3 F (36.8 C) (Oral)   Resp 14   Ht 5\' 6"  (1.676 m)   Wt 59.9 kg   SpO2 100%   BMI 21.31 kg/m   Physical Exam Constitutional:      General: She is not in acute distress. HENT:     Head: Normocephalic and atraumatic.  Eyes:     Conjunctiva/sclera: Conjunctivae normal.     Pupils: Pupils are equal, round, and reactive to light.  Cardiovascular:     Rate and Rhythm: Normal rate and regular rhythm.  Pulmonary:     Effort: Pulmonary effort is normal. No respiratory distress.  Abdominal:     General: There is no distension.     Tenderness: There is abdominal tenderness in the right lower quadrant. There is no guarding or rebound.  Skin:    General: Skin is warm and dry.  Neurological:     General: No focal deficit present.     Mental Status: She is alert. Mental status is at baseline.  Psychiatric:        Mood and Affect: Mood normal.        Behavior: Behavior normal.    ED Results / Procedures / Treatments   Labs (all labs ordered are listed, but only abnormal results are displayed) Labs Reviewed  COMPREHENSIVE METABOLIC PANEL - Abnormal; Notable for the following components:      Result Value   Glucose, Bld 162 (*)    AST 11 (*)    All other components within normal  limits  CBC WITH DIFFERENTIAL/PLATELET - Abnormal; Notable for the following components:   RBC 3.77 (*)    Hemoglobin 11.3 (*)    HCT 35.2 (*)    All other components within normal limits  RESP PANEL BY RT-PCR (FLU A&B, COVID) ARPGX2  LIPASE, BLOOD  URINALYSIS, ROUTINE W REFLEX MICROSCOPIC  PREGNANCY, URINE    EKG None  Radiology No results found.  Procedures Procedures   Medications Ordered in ED Medications  ketorolac (TORADOL) 30 MG/ML injection 30 mg (30 mg Intravenous Given 02/18/21 1204)  sodium chloride 0.9 % bolus 500 mL (0 mLs Intravenous Stopped 02/18/21 1322)  metoCLOPramide (REGLAN) injection 10 mg (  10 mg Intravenous Given 02/18/21 1205)    ED Course  I have reviewed the triage vital signs and the nursing notes.  Pertinent labs & imaging results that were available during my care of the patient were reviewed by me and considered in my medical decision making (see chart for details).  Ddx includes appendicitis vs colitis vs UTI vs constipation vs other  I reviewed her prior medical records including her ED visit and work-up 2 weeks ago, CT scan abdomen at the time.  We will reassess her blood work and white blood cell count.  This clinical presentation is consistent with appendicitis.  She does not demonstrate signs of peritonitis, rigid abdomen, sepsis, or shock on her initial presentation.  She is otherwise well-appearing.  IV Toradol, IV Reglan, IV fluids ordered for abdominal pain, nausea, and headache.  She does suffer from chronic headaches.  I doubt this is meningitis or ICH.  Labs reviewed - cbc normal, labs at baseline Prior ED visit records reviewed including CT abdomen from 02/07/21 with appendicolith, 10 mm dilation  Clinical Course as of 02/18/21 1700  Wed Feb 18, 2021  1230 Paged general surgery to discuss [MT]  1237 I spoke to dr tseui from general surgery who recommends transfer to [facility] for appendectomy (today or tomorrow), keep NPO for now.   Patient okay for med/surg bed.  Contacting [facility] now. [MT]    Clinical Course User Index [MT] Enora Trillo, Kermit Balo, MD     Final Clinical Impression(s) / ED Diagnoses Final diagnoses:  Right lower quadrant abdominal pain    Rx / DC Orders ED Discharge Orders     None        Terald Sleeper, MD 02/18/21 1700

## 2021-02-18 NOTE — ED Triage Notes (Signed)
Pt arrives POV, with c/o right lower quadrant abdominal pain for over a week.  Nausea and vomiting last night, as well as dizziness.  Was seen on 8/20 in ED and states she had CT that showed appendicitis.

## 2021-02-18 NOTE — Progress Notes (Signed)
Opened in error for transfer. Surgery admitting patient.  Orland Mustard, MD [facility]

## 2021-02-18 NOTE — ED Notes (Addendum)
Spoke with Gala Romney from [facility] at 12:35pm and was told to call back at 12:50pm Doug from [facility] called back and will page Hospitalist.

## 2021-02-18 NOTE — ED Notes (Signed)
Report called to Mardella Layman, RN, 6N at [facility]

## 2021-02-18 NOTE — Anesthesia Preprocedure Evaluation (Addendum)
Anesthesia Evaluation  Patient identified by MRN, date of birth, ID band Patient awake    Reviewed: Allergy & Precautions, NPO status , Patient's Chart, lab work & pertinent test results, reviewed documented beta blocker date and time   Airway Mallampati: II  TM Distance: >3 FB Neck ROM: Full   Comment: Pierced left nares Dental no notable dental hx. (+) Teeth Intact, Caps, Dental Advisory Given   Pulmonary neg pulmonary ROS,    Pulmonary exam normal breath sounds clear to auscultation       Cardiovascular hypertension, Pt. on medications and Pt. on home beta blockers Normal cardiovascular exam Rhythm:Regular Rate:Normal  Tachycardia   Neuro/Psych  Headaches, Anxiety    GI/Hepatic Neg liver ROS, Chronic appendicitis   Endo/Other  Elevated HcG Hyperglycemia  Renal/GU negative Renal ROS  negative genitourinary   Musculoskeletal   Abdominal (+)  Abdomen: tender.    Peds  Hematology  (+) anemia ,   Anesthesia Other Findings   Reproductive/Obstetrics                          Anesthesia Physical Anesthesia Plan  ASA: 2  Anesthesia Plan: General   Post-op Pain Management:    Induction: Intravenous  PONV Risk Score and Plan: 4 or greater and Treatment may vary due to age or medical condition, Scopolamine patch - Pre-op, Midazolam, Dexamethasone and Ondansetron  Airway Management Planned: Oral ETT  Additional Equipment:   Intra-op Plan:   Post-operative Plan: Extubation in OR  Informed Consent: I have reviewed the patients History and Physical, chart, labs and discussed the procedure including the risks, benefits and alternatives for the proposed anesthesia with the patient or authorized representative who has indicated his/her understanding and acceptance.     Dental advisory given  Plan Discussed with: CRNA and Anesthesiologist  Anesthesia Plan Comments:        Anesthesia  Quick Evaluation

## 2021-02-19 ENCOUNTER — Inpatient Hospital Stay (HOSPITAL_COMMUNITY): Payer: 59 | Admitting: Anesthesiology

## 2021-02-19 ENCOUNTER — Encounter (HOSPITAL_COMMUNITY): Admission: EM | Disposition: A | Discharge status: Home / Self Care | Payer: Self-pay | Source: Home / Self Care

## 2021-02-19 ENCOUNTER — Encounter (HOSPITAL_COMMUNITY): Payer: Self-pay | Admitting: Surgery

## 2021-02-19 DIAGNOSIS — Z9049 Acquired absence of other specified parts of digestive tract: Secondary | ICD-10-CM

## 2021-02-19 HISTORY — PX: LAPAROSCOPY: SHX197

## 2021-02-19 HISTORY — PX: LAPAROSCOPIC APPENDECTOMY: SHX408

## 2021-02-19 HISTORY — PX: LYSIS OF ADHESION: SHX5961

## 2021-02-19 LAB — CBC
HCT: 34.5 % — ABNORMAL LOW (ref 36.0–46.0)
Hemoglobin: 10.8 g/dL — ABNORMAL LOW (ref 12.0–15.0)
MCH: 29.3 pg (ref 26.0–34.0)
MCHC: 31.3 g/dL (ref 30.0–36.0)
MCV: 93.8 fL (ref 80.0–100.0)
Platelets: 369 10*3/uL (ref 150–400)
RBC: 3.68 MIL/uL — ABNORMAL LOW (ref 3.87–5.11)
RDW: 13.3 % (ref 11.5–15.5)
WBC: 6.6 10*3/uL (ref 4.0–10.5)
nRBC: 0 % (ref 0.0–0.2)

## 2021-02-19 LAB — BASIC METABOLIC PANEL
Anion gap: 9 (ref 5–15)
BUN: 10 mg/dL (ref 6–20)
CO2: 24 mmol/L (ref 22–32)
Calcium: 8.9 mg/dL (ref 8.9–10.3)
Chloride: 107 mmol/L (ref 98–111)
Creatinine, Ser: 0.72 mg/dL (ref 0.44–1.00)
GFR, Estimated: >60 mL/min (ref >60)
Glucose, Bld: 94 mg/dL (ref 70–99)
Potassium: 3.9 mmol/L (ref 3.5–5.1)
Sodium: 140 mmol/L (ref 135–145)

## 2021-02-19 LAB — SURGICAL PCR SCREEN
MRSA, PCR: NEGATIVE
Staphylococcus aureus: NEGATIVE

## 2021-02-19 SURGERY — APPENDECTOMY, LAPAROSCOPIC
Anesthesia: General | Site: Abdomen

## 2021-02-19 MED ORDER — DEXAMETHASONE SODIUM PHOSPHATE 10 MG/ML IJ SOLN
INTRAMUSCULAR | Status: DC | PRN
  Administered 2021-02-19: 8 mg via INTRAVENOUS

## 2021-02-19 MED ORDER — LIDOCAINE 2% (20 MG/ML) 5 ML SYRINGE
INTRAMUSCULAR | Status: AC
  Filled 2021-02-19: qty 5

## 2021-02-19 MED ORDER — DIPHENHYDRAMINE HCL 50 MG/ML IJ SOLN
INTRAMUSCULAR | Status: AC
  Filled 2021-02-19: qty 1

## 2021-02-19 MED ORDER — FENTANYL CITRATE (PF) 100 MCG/2ML IJ SOLN
INTRAMUSCULAR | Status: DC | PRN
  Administered 2021-02-19 (×3): 50 ug via INTRAVENOUS

## 2021-02-19 MED ORDER — ONDANSETRON HCL 4 MG/2ML IJ SOLN
INTRAMUSCULAR | Status: AC
  Filled 2021-02-19: qty 2

## 2021-02-19 MED ORDER — FENTANYL CITRATE (PF) 250 MCG/5ML IJ SOLN
INTRAMUSCULAR | Status: AC
  Filled 2021-02-19: qty 5

## 2021-02-19 MED ORDER — MIDAZOLAM HCL 5 MG/5ML IJ SOLN
INTRAMUSCULAR | Status: DC | PRN
  Administered 2021-02-19: 2 mg via INTRAVENOUS

## 2021-02-19 MED ORDER — SUGAMMADEX SODIUM 200 MG/2ML IV SOLN
INTRAVENOUS | Status: DC | PRN
Start: 2021-02-19 — End: 2021-02-19
  Administered 2021-02-19: 200 mg via INTRAVENOUS

## 2021-02-19 MED ORDER — SCOPOLAMINE 1 MG/3DAYS TD PT72
1.0000 | MEDICATED_PATCH | TRANSDERMAL | Status: DC
  Administered 2021-02-19: 1 via TRANSDERMAL
  Filled 2021-02-19: qty 1

## 2021-02-19 MED ORDER — ORAL CARE MOUTH RINSE: 15.0000 mL | Freq: Once | OROMUCOSAL | Status: AC

## 2021-02-19 MED ORDER — MIDAZOLAM HCL 2 MG/2ML IJ SOLN
INTRAMUSCULAR | Status: AC
  Filled 2021-02-19: qty 2

## 2021-02-19 MED ORDER — CHLORHEXIDINE GLUCONATE 0.12 % MT SOLN
OROMUCOSAL | Status: AC
  Administered 2021-02-19: 15 mL via OROMUCOSAL
  Filled 2021-02-19: qty 15

## 2021-02-19 MED ORDER — KETOROLAC TROMETHAMINE 30 MG/ML IJ SOLN
INTRAMUSCULAR | Status: AC
  Filled 2021-02-19: qty 1

## 2021-02-19 MED ORDER — CHLORHEXIDINE GLUCONATE 0.12 % MT SOLN: 15.0000 mL | Freq: Once | OROMUCOSAL | Status: AC

## 2021-02-19 MED ORDER — BUPIVACAINE HCL (PF) 0.25 % IJ SOLN
INTRAMUSCULAR | Status: AC
  Filled 2021-02-19: qty 30

## 2021-02-19 MED ORDER — BUPIVACAINE-EPINEPHRINE 0.5% -1:200000 IJ SOLN
INTRAMUSCULAR | Status: AC
  Filled 2021-02-19: qty 1

## 2021-02-19 MED ORDER — ONDANSETRON HCL 4 MG/2ML IJ SOLN
INTRAMUSCULAR | Status: DC | PRN
  Administered 2021-02-19: 4 mg via INTRAVENOUS

## 2021-02-19 MED ORDER — DIPHENHYDRAMINE HCL 50 MG/ML IJ SOLN
INTRAMUSCULAR | Status: DC | PRN
  Administered 2021-02-19: 6.25 mg via INTRAVENOUS

## 2021-02-19 MED ORDER — BUPIVACAINE HCL (PF) 0.25 % IJ SOLN
INTRAMUSCULAR | Status: DC | PRN
  Administered 2021-02-19: 8 mL

## 2021-02-19 MED ORDER — DEXAMETHASONE SODIUM PHOSPHATE 10 MG/ML IJ SOLN
INTRAMUSCULAR | Status: AC
  Filled 2021-02-19: qty 1

## 2021-02-19 MED ORDER — LACTATED RINGERS IV SOLN: INTRAVENOUS | Status: DC

## 2021-02-19 MED ORDER — HYDROMORPHONE HCL 1 MG/ML IJ SOLN: 0.5000 mg | INTRAMUSCULAR | Status: DC | PRN

## 2021-02-19 MED ORDER — OXYCODONE HCL 5 MG/5ML PO SOLN: 5.0000 mg | Freq: Once | ORAL | Status: DC | PRN

## 2021-02-19 MED ORDER — 0.9 % SODIUM CHLORIDE (POUR BTL) OPTIME
TOPICAL | Status: DC | PRN
  Administered 2021-02-19: 1000 mL

## 2021-02-19 MED ORDER — ROCURONIUM BROMIDE 100 MG/10ML IV SOLN
INTRAVENOUS | Status: DC | PRN
  Administered 2021-02-19: 60 mg via INTRAVENOUS
  Administered 2021-02-19: 5 mg via INTRAVENOUS

## 2021-02-19 MED ORDER — LIDOCAINE 2% (20 MG/ML) 5 ML SYRINGE
INTRAMUSCULAR | Status: DC | PRN
  Administered 2021-02-19: 60 mg via INTRAVENOUS

## 2021-02-19 MED ORDER — ONDANSETRON HCL 4 MG/2ML IJ SOLN: 4.0000 mg | Freq: Once | INTRAMUSCULAR | Status: DC | PRN

## 2021-02-19 MED ORDER — OXYCODONE HCL 5 MG PO TABS: 5.0000 mg | ORAL_TABLET | Freq: Four times a day (QID) | ORAL | 0 refills | Status: DC | PRN

## 2021-02-19 MED ORDER — FENTANYL CITRATE (PF) 100 MCG/2ML IJ SOLN: 25.0000 ug | INTRAMUSCULAR | Status: DC | PRN

## 2021-02-19 MED ORDER — KETOROLAC TROMETHAMINE 30 MG/ML IJ SOLN
INTRAMUSCULAR | Status: DC | PRN
  Administered 2021-02-19: 30 mg via INTRAVENOUS

## 2021-02-19 MED ORDER — PROPOFOL 10 MG/ML IV BOLUS
INTRAVENOUS | Status: DC | PRN
  Administered 2021-02-19: 160 mg via INTRAVENOUS

## 2021-02-19 MED ORDER — ROCURONIUM BROMIDE 10 MG/ML (PF) SYRINGE
PREFILLED_SYRINGE | INTRAVENOUS | Status: AC
  Filled 2021-02-19: qty 10

## 2021-02-19 MED ORDER — POLYETHYLENE GLYCOL 3350 17 G PO PACK
17.0000 g | PACK | Freq: Every day | ORAL | 0 refills | Status: DC | PRN
Start: 2021-02-19 — End: 2021-11-18

## 2021-02-19 MED ORDER — PROPOFOL 10 MG/ML IV BOLUS
INTRAVENOUS | Status: AC
  Filled 2021-02-19: qty 20

## 2021-02-19 MED ORDER — OXYCODONE HCL 5 MG PO TABS: 5.0000 mg | ORAL_TABLET | Freq: Once | ORAL | Status: DC | PRN

## 2021-02-19 SURGICAL SUPPLY — 42 items
APPLIER CLIP ROT 10 11.4 M/L (STAPLE)
BAG COUNTER SPONGE SURGICOUNT (BAG) ×3 IMPLANT
BENZOIN TINCTURE PRP APPL 2/3 (GAUZE/BANDAGES/DRESSINGS) ×3 IMPLANT
BLADE CLIPPER SURG (BLADE) IMPLANT
CANISTER SUCT 3000ML PPV (MISCELLANEOUS) IMPLANT
CHLORAPREP W/TINT 26 (MISCELLANEOUS) ×3 IMPLANT
CLIP APPLIE ROT 10 11.4 M/L (STAPLE) IMPLANT
COVER SURGICAL LIGHT HANDLE (MISCELLANEOUS) ×3 IMPLANT
CUTTER FLEX LINEAR 45M (STAPLE) ×3 IMPLANT
DRSG TEGADERM 2-3/8X2-3/4 SM (GAUZE/BANDAGES/DRESSINGS) ×3 IMPLANT
DRSG TEGADERM 4X4.5 CHG (GAUZE/BANDAGES/DRESSINGS) ×3 IMPLANT
DRSG TEGADERM 4X4.75 (GAUZE/BANDAGES/DRESSINGS) ×3 IMPLANT
ELECT REM PT RETURN 9FT ADLT (ELECTROSURGICAL) ×3
ELECTRODE REM PT RTRN 9FT ADLT (ELECTROSURGICAL) ×2 IMPLANT
ENDOLOOP SUT PDS II  0 18 (SUTURE)
ENDOLOOP SUT PDS II 0 18 (SUTURE) IMPLANT
GAUZE SPONGE 2X2 8PLY STRL LF (GAUZE/BANDAGES/DRESSINGS) ×2 IMPLANT
GLOVE SURG ENC MOIS LTX SZ7 (GLOVE) ×3 IMPLANT
GLOVE SURG UNDER POLY LF SZ7.5 (GLOVE) ×3 IMPLANT
GOWN STRL REUS W/ TWL LRG LVL3 (GOWN DISPOSABLE) ×6 IMPLANT
GOWN STRL REUS W/TWL LRG LVL3 (GOWN DISPOSABLE) ×9
KIT BASIN OR (CUSTOM PROCEDURE TRAY) ×3 IMPLANT
KIT TURNOVER KIT B (KITS) ×3 IMPLANT
NS IRRIG 1000ML POUR BTL (IV SOLUTION) ×3 IMPLANT
PAD ARMBOARD 7.5X6 YLW CONV (MISCELLANEOUS) ×6 IMPLANT
POUCH SPECIMEN RETRIEVAL 10MM (ENDOMECHANICALS) ×3 IMPLANT
RELOAD STAPLE TA45 3.5 REG BLU (ENDOMECHANICALS) ×3 IMPLANT
SCISSORS LAP 5X35 DISP (ENDOMECHANICALS) ×3 IMPLANT
SET IRRIG TUBING LAPAROSCOPIC (IRRIGATION / IRRIGATOR) ×3 IMPLANT
SET TUBE SMOKE EVAC HIGH FLOW (TUBING) ×3 IMPLANT
SHEARS HARMONIC ACE PLUS 36CM (ENDOMECHANICALS) ×3 IMPLANT
SLEEVE ENDOPATH XCEL 5M (ENDOMECHANICALS) ×3 IMPLANT
SPECIMEN JAR SMALL (MISCELLANEOUS) ×3 IMPLANT
SPONGE GAUZE 2X2 STER 10/PKG (GAUZE/BANDAGES/DRESSINGS) ×1
STRIP CLOSURE SKIN 1/2X4 (GAUZE/BANDAGES/DRESSINGS) ×3 IMPLANT
SUT MNCRL AB 4-0 PS2 18 (SUTURE) ×3 IMPLANT
TOWEL GREEN STERILE (TOWEL DISPOSABLE) ×3 IMPLANT
TOWEL GREEN STERILE FF (TOWEL DISPOSABLE) ×3 IMPLANT
TRAY LAPAROSCOPIC MC (CUSTOM PROCEDURE TRAY) ×3 IMPLANT
TROCAR XCEL BLUNT TIP 100MML (ENDOMECHANICALS) ×3 IMPLANT
TROCAR XCEL NON-BLD 5MMX100MML (ENDOMECHANICALS) ×3 IMPLANT
WATER STERILE IRR 1000ML POUR (IV SOLUTION) ×3 IMPLANT

## 2021-02-19 NOTE — Discharge Instructions (Signed)
[facility] ______[facility] LAPAROSCOPIC SURGERY: POST OP INSTRUCTIONS Always review your discharge instruction sheet given to you by the facility where your surgery was performed. IF YOU HAVE DISABILITY OR FAMILY LEAVE FORMS, YOU MUST BRING THEM TO THE OFFICE FOR PROCESSING.   DO NOT GIVE THEM TO YOUR DOCTOR.  A prescription for pain medication may be given to you upon discharge.  Take your pain medication as prescribed, if needed.  If narcotic pain medicine is not needed, then you may take acetaminophen (Tylenol) or ibuprofen (Advil) as needed. Take your usually prescribed medications unless otherwise directed. If you need a refill on your pain medication, please contact your pharmacy.  They will contact our office to request authorization. Prescriptions will not be filled after 5pm or on week-ends. You should follow a light diet the first few days after arrival home, such as soup and crackers, etc.  Be sure to include lots of fluids daily. Most patients will experience some swelling and bruising in the area of the incisions.  Ice packs will help.  Swelling and bruising can take several days to resolve.  It is common to experience some constipation if taking pain medication after surgery.  Increasing fluid intake and taking a stool softener (such as Colace) will usually help or prevent this problem from occurring.  A mild laxative (Milk of Magnesia or Miralax) should be taken according to package instructions if there are no bowel movements after 48 hours. Unless discharge instructions indicate otherwise, you may remove your bandages 24-48 hours after surgery, and you may shower at that time.  You may have steri-strips (small skin tapes) in place directly over the incision.  These strips should be left on the skin for 7-10 days.  If your surgeon used skin glue on the incision, you may shower in 24 hours.  The glue will flake off over the next 2-3 weeks.  Any sutures or staples will be  removed at the office during your follow-up visit. ACTIVITIES:  You may resume regular (light) daily activities beginning the next day--such as daily self-care, walking, climbing stairs--gradually increasing activities as tolerated.  You may have sexual intercourse when it is comfortable.  Refrain from any heavy lifting or straining until approved by your doctor. You may drive when you are no longer taking prescription pain medication, you can comfortably wear a seatbelt, and you can safely maneuver your car and apply brakes. RETURN TO WORK:  __________________________________________________________ You should see your doctor in the office for a follow-up appointment approximately 2-3 weeks after your surgery.  Make sure that you call for this appointment within a day or two after you arrive home to insure a convenient appointment time. OTHER INSTRUCTIONS: __________________________________________________________________________________________________________________________ __________________________________________________________________________________________________________________________ WHEN TO CALL YOUR DOCTOR: Fever over 101.0 Inability to urinate Continued bleeding from incision. Increased pain, redness, or drainage from the incision. Increasing abdominal pain  The clinic staff is available to answer your questions during regular business hours.  Please don't hesitate to call and ask to speak to one of the nurses for clinical concerns.  If you have a medical emergency, go to the nearest emergency room or call 911.  A surgeon from [facility] is always on call at the hospital. [address], [address], [place], [place]  [address] ? [address] [address], [place], [place]   [address] [phone] ? [phone] ? FAX [phone] Web site: [email or web address]  

## 2021-02-19 NOTE — Discharge Summary (Signed)
[facility] Discharge Summary   Patient ID: [name] MRN: [id number] DOB/AGE: 10/29/1965 55 y.o.  Admit date: 02/18/2021 Discharge date: 02/19/2021  Admitting Diagnosis: Appendicolith/early appendicitis  Discharge Diagnosis Chronic appendicitis s/p laparoscopic appendectomy with lysis of adhesions  Consultants None   Procedures Dr. [name] (02/19/21) - Diagnostic Laparoscopy, LOA, Laparoscopic Appendectomy  Hospital Course:  55 year old female who presented to the [facility] Emergency Department with abdominal pain,nuasea and vomiting.  Workup showed appendicolith/early appendicitis.  Patient was admitted and underwent procedure listed above.  Patient tolerated procedure well and was transferred to the floor.  Diet was advanced as tolerated.  On date of her procedure, the patient was voiding well, tolerating diet, ambulating well, pain well controlled, vital signs stable, incisions c/d/i and felt stable for discharge home.  Patient will follow up in our office in 3 weeks and knows to call with questions or concerns.     Physical Exam: General:  Alert, NAD, pleasant, comfortable Abd:  Soft, ND, mild tenderness, incisions with gauze and tegaderm dressing in place - C/D/I,   Allergies as of 02/19/2021       Reactions   Codeine Itching        Medication List     STOP taking these medications    aspirin-acetaminophen-caffeine 250-250-65 MG tablet Commonly known as: EXCEDRIN MIGRAINE   cephALEXin 500 MG capsule Commonly known as: KEFLEX   ibuprofen 800 MG tablet Commonly known as: ADVIL       TAKE these medications    acetaminophen 500 MG tablet Commonly known as: TYLENOL Take 1,000 mg by mouth every 6 (six) hours as needed for mild pain.   atenolol 100 MG tablet Commonly known as: TENORMIN Take 1 tablet (100 mg total) by mouth daily.   baclofen 10 MG tablet Commonly known as: LIORESAL Take 1 tablet (10 mg total) by mouth 2 (two) times daily  as needed for muscle spasms.   melatonin 3 MG Tabs tablet Take 3 mg by mouth as needed (sleep).   One-A-Day Womens 50 Plus Tabs Take 1 tablet by mouth 3 (three) times a week.   oxyCODONE 5 MG immediate release tablet Commonly known as: Oxy IR/ROXICODONE Take 1 tablet (5 mg total) by mouth every 6 (six) hours as needed for breakthrough pain.   PARoxetine 10 MG tablet Commonly known as: PAXIL Take 1 tablet (10 mg total) by mouth daily.   polyethylene glycol 17 g packet Commonly known as: MIRALAX / GLYCOLAX Take 17 g by mouth daily as needed for mild constipation.          Follow-up Information     [facility] Call on 03/12/2021.   Specialty: General Surgery Why: 2pm. Please arrive 30 minutes prior to your appointment for paperwork. Please bring a copy of your photo ID and insurance card. Contact information: [address] [place] [address] [phone]                 Signed: Leary Roca, [facility] 02/19/2021, 2:51 PM Please see Amion for pager number during day hours 7:00am-4:30pm

## 2021-02-19 NOTE — Transfer of Care (Signed)
Immediate Anesthesia Transfer of Care Note  Patient: [name]  Procedure(s) Performed: APPENDECTOMY LAPAROSCOPIC (Abdomen) LAPAROSCOPY DIAGNOSTIC LYSIS OF ADHESION  Patient Location: PACU  Anesthesia Type:General  Level of Consciousness: awake, alert  and oriented  Airway & Oxygen Therapy: Patient Spontanous Breathing  Post-op Assessment: Report given to RN and Post -op Vital signs reviewed and stable  Post vital signs: Reviewed and stable  Last Vitals:  Vitals Value Taken Time  BP 129/83 02/19/21 0854  Temp    Pulse 85 02/19/21 0854  Resp 16 02/19/21 0854  SpO2 98 % 02/19/21 0854  Vitals shown include unvalidated device data.  Last Pain:  Vitals:   02/19/21 0659  TempSrc: Oral  PainSc:          Complications: No notable events documented.

## 2021-02-19 NOTE — Anesthesia Postprocedure Evaluation (Signed)
Anesthesia Post Note  Patient: [name]  Procedure(s) Performed: APPENDECTOMY LAPAROSCOPIC (Abdomen) LAPAROSCOPY DIAGNOSTIC LYSIS OF ADHESION     Patient location during evaluation: PACU Anesthesia Type: General Level of consciousness: awake and alert and oriented Pain management: pain level controlled Vital Signs Assessment: post-procedure vital signs reviewed and stable Respiratory status: spontaneous breathing, nonlabored ventilation and respiratory function stable Cardiovascular status: blood pressure returned to baseline and stable Postop Assessment: no apparent nausea or vomiting Anesthetic complications: no   No notable events documented.  Last Vitals:  Vitals:   02/19/21 0905 02/19/21 0925  BP: 130/74 125/71  Pulse: 81 82  Resp: 17 19  Temp:    SpO2: 98% 99%    Last Pain:  Vitals:   02/19/21 0905  TempSrc:   PainSc: 0-No pain                 Taliah Porche A.

## 2021-02-19 NOTE — H&P (Addendum)
Reason for Consult/Chief Complaint: abdominal pain  Consultant: Rhunette Croft, MD  [name] is an 55 y.o. female.   HPI: 15F with 6 months of abdominal pain, nausea, and vomiting that has been chronic. Pain localizes to RLQ. Seen 8/20 for these symptoms, CT with early appendicitis. Plan for o/p f/u but symptoms progressed causing her to re-present. Transferred from [facility]. WBC normal. Positive pregnancy test mid-August, but is s/p TAH. No pregnancy seen in tube/ovary and repeat UPT here is negative. Last c-scope in 2001, reports colitis, but no polyps. Works for [employer] regularly.   Past Medical History:  Diagnosis Date   Hypertension    Migraines     Past Surgical History:  Procedure Laterality Date   ABDOMINAL HYSTERECTOMY     CHOLECYSTECTOMY     CYST EXCISION      Family History  Problem Relation Age of Onset   Hypertension Mother    Hypertension Father     Social History:  reports that she has never smoked. She has never used smokeless tobacco. She reports that she does not drink alcohol and does not use drugs.  Allergies:  Allergies  Allergen Reactions   Codeine Itching    Medications: I have reviewed the patient's current medications.  Results for orders placed or performed during the hospital encounter of 02/18/21 (from the past 48 hour(s))  Lipase, blood     Status: None   Collection Time: 02/18/21 11:37 AM  Result Value Ref Range   Lipase 26 11 - 51 U/L    Comment: Performed at [facility], [address] [place], [place] [address]  Comprehensive metabolic panel     Status: Abnormal   Collection Time: 02/18/21 11:37 AM  Result Value Ref Range   Sodium 141 135 - 145 mmol/L   Potassium 3.5 3.5 - 5.1 mmol/L   Chloride 105 98 - 111 mmol/L   CO2 28 22 - 32 mmol/L   Glucose, Bld 162 (H) 70 - 99 mg/dL    Comment: Glucose reference range applies only to samples taken after fasting for at least 8 hours.    BUN 17 6 - 20 mg/dL   Creatinine, Ser 4.17 0.44 - 1.00 mg/dL   Calcium 9.4 8.9 - 40.8 mg/dL   Total Protein 7.3 6.5 - 8.1 g/dL   Albumin 4.1 3.5 - 5.0 g/dL   AST 11 (L) 15 - 41 U/L   ALT 8 0 - 44 U/L   Alkaline Phosphatase 56 38 - 126 U/L   Total Bilirubin 0.4 0.3 - 1.2 mg/dL   GFR, Estimated >14 >48 mL/min    Comment: (NOTE) Calculated using the CKD-EPI Creatinine Equation (2021)    Anion gap 8 5 - 15    Comment: Performed at [facility], [address] [place], [place] [address]  CBC with Differential     Status: Abnormal   Collection Time: 02/18/21 11:37 AM  Result Value Ref Range   WBC 6.8 4.0 - 10.5 K/uL   RBC 3.77 (L) 3.87 - 5.11 MIL/uL   Hemoglobin 11.3 (L) 12.0 - 15.0 g/dL   HCT 14.9 (L) 70.2 - 63.7 %   MCV 93.4 80.0 - 100.0 fL   MCH 30.0 26.0 - 34.0 pg   MCHC 32.1 30.0 - 36.0 g/dL   RDW 85.8 85.0 - 27.7 %   Platelets 396 150 - 400 K/uL   nRBC 0.0 0.0 - 0.2 %   Neutrophils Relative % 65 %  Neutro Abs 4.3 1.7 - 7.7 K/uL   Lymphocytes Relative 23 %   Lymphs Abs 1.6 0.7 - 4.0 K/uL   Monocytes Relative 9 %   Monocytes Absolute 0.6 0.1 - 1.0 K/uL   Eosinophils Relative 2 %   Eosinophils Absolute 0.2 0.0 - 0.5 K/uL   Basophils Relative 1 %   Basophils Absolute 0.1 0.0 - 0.1 K/uL   Immature Granulocytes 0 %   Abs Immature Granulocytes 0.03 0.00 - 0.07 K/uL    Comment: Performed at [facility], [address] [place], [place] [address]  Resp Panel by RT-PCR (Flu A&B, Covid) Nasopharyngeal Swab     Status: None   Collection Time: 02/18/21 12:56 PM   Specimen: Nasopharyngeal Swab; Nasopharyngeal(NP) swabs in vial transport medium  Result Value Ref Range   SARS Coronavirus 2 by RT PCR NEGATIVE NEGATIVE    Comment: (NOTE) SARS-CoV-2 target nucleic acids are NOT DETECTED.  The SARS-CoV-2 RNA is generally detectable in upper respiratory specimens during the acute phase of infection. The lowest concentration of SARS-CoV-2 viral  copies this assay can detect is 138 copies/mL. A negative result does not preclude SARS-Cov-2 infection and should not be used as the sole basis for treatment or other patient management decisions. A negative result may occur with  improper specimen collection/handling, submission of specimen other than nasopharyngeal swab, presence of viral mutation(s) within the areas targeted by this assay, and inadequate number of viral copies(<138 copies/mL). A negative result must be combined with clinical observations, patient history, and epidemiological information. The expected result is Negative.  Fact Sheet for Patients:  BloggerCourse.com  Fact Sheet for Healthcare Providers:  SeriousBroker.it  This test is no t yet approved or cleared by the [place] FDA and  has been authorized for detection and/or diagnosis of SARS-CoV-2 by FDA under an Emergency Use Authorization (EUA). This EUA will remain  in effect (meaning this test can be used) for the duration of the COVID-19 declaration under Section 564(b)(1) of the Act, 21 U.S.C.section 360bbb-3(b)(1), unless the authorization is terminated  or revoked sooner.       Influenza A by PCR NEGATIVE NEGATIVE   Influenza B by PCR NEGATIVE NEGATIVE    Comment: (NOTE) The Xpert Xpress SARS-CoV-2/FLU/RSV plus assay is intended as an aid in the diagnosis of influenza from Nasopharyngeal swab specimens and should not be used as a sole basis for treatment. Nasal washings and aspirates are unacceptable for Xpert Xpress SARS-CoV-2/FLU/RSV testing.  Fact Sheet for Patients: BloggerCourse.com  Fact Sheet for Healthcare Providers: SeriousBroker.it  This test is not yet approved or cleared by the [place] FDA and has been authorized for detection and/or diagnosis of SARS-CoV-2 by FDA under an Emergency Use Authorization (EUA). This EUA will  remain in effect (meaning this test can be used) for the duration of the COVID-19 declaration under Section 564(b)(1) of the Act, 21 U.S.C. section 360bbb-3(b)(1), unless the authorization is terminated or revoked.  Performed at [facility], [address] [place], [place] [address]   Urinalysis, Routine w reflex microscopic Urine, Clean Catch     Status: Abnormal   Collection Time: 02/18/21  4:59 PM  Result Value Ref Range   Color, Urine YELLOW YELLOW   APPearance CLEAR CLEAR   Specific Gravity, Urine 1.022 1.005 - 1.030   pH 6.0 5.0 - 8.0   Glucose, UA NEGATIVE NEGATIVE mg/dL   Hgb urine dipstick NEGATIVE NEGATIVE   Bilirubin Urine NEGATIVE NEGATIVE   Ketones, ur NEGATIVE  NEGATIVE mg/dL   Protein, ur NEGATIVE NEGATIVE mg/dL   Nitrite POSITIVE (A) NEGATIVE   Leukocytes,Ua NEGATIVE NEGATIVE   RBC / HPF 0-5 0 - 5 RBC/hpf   WBC, UA 0-5 0 - 5 WBC/hpf   Bacteria, UA MANY (A) NONE SEEN   Squamous Epithelial / LPF 0-5 0 - 5   Mucus PRESENT     Comment: Performed at [facility], [address], [place], [place] [address]  Pregnancy, urine     Status: None   Collection Time: 02/18/21  4:59 PM  Result Value Ref Range   Preg Test, Ur NEGATIVE NEGATIVE    Comment:        THE SENSITIVITY OF THIS METHODOLOGY IS >20 mIU/mL. Performed at [facility], [address], [place], [place] [address]   HIV Antibody (routine testing w rflx)     Status: None   Collection Time: 02/18/21  7:34 PM  Result Value Ref Range   HIV Screen 4th Generation wRfx Non Reactive Non Reactive    Comment: Performed at [facility], [address], [place], [place] [address]  CBC     Status: Abnormal   Collection Time: 02/18/21  7:34 PM  Result Value Ref Range   WBC 6.9 4.0 - 10.5 K/uL   RBC 3.79 (L) 3.87 - 5.11 MIL/uL   Hemoglobin 11.4 (L) 12.0 - 15.0 g/dL   HCT 63.1 (L) 49.7 - 02.6 %   MCV 92.6 80.0 - 100.0 fL   MCH 30.1 26.0 - 34.0 pg    MCHC 32.5 30.0 - 36.0 g/dL   RDW 37.8 58.8 - 50.2 %   Platelets 382 150 - 400 K/uL   nRBC 0.0 0.0 - 0.2 %    Comment: Performed at [facility], [address] [place], [place] [address]  Creatinine, serum     Status: None   Collection Time: 02/18/21  7:34 PM  Result Value Ref Range   Creatinine, Ser 0.71 0.44 - 1.00 mg/dL   GFR, Estimated >87 >86 mL/min    Comment: (NOTE) Calculated using the CKD-EPI Creatinine Equation (2021) Performed at [facility], [address] [place], [place] [address]     No results found.  ROS 10 point review of systems is negative except as listed above in HPI.   Physical Exam Blood pressure 131/82, pulse 69, temperature 97.7 F (36.5 C), temperature source Oral, resp. rate 18, height 5\' 6"  (1.676 m), weight 59.9 kg, SpO2 95 %. Constitutional: well-developed, well-nourished HEENT: pupils equal, round, reactive to light, 71mm b/l, moist conjunctiva, external inspection of ears and nose normal, hearing intact Oropharynx: normal oropharyngeal mucosa, normal dentition Neck: no thyromegaly, trachea midline, no midline cervical tenderness to palpation Chest: breath sounds equal bilaterally, normal respiratory effort, no midline or lateral chest wall tenderness to palpation/deformity Abdomen: soft, +RLQ TTP, no bruising, no hepatosplenomegaly GU: normal female genitalia  Back: no wounds, no thoracic/lumbar spine tenderness to palpation, no thoracic/lumbar spine stepoffs Rectal: deferred Extremities: 2+ radial and pedal pulses bilaterally, intact motor and sensation bilateral UE and LE, no peripheral edema MSK: normal gait/station, no clubbing/cyanosis of fingers/toes, normal ROM of all four extremities Skin: warm, dry, no rashes Psych: normal memory, normal mood/affect    Assessment/Plan: 69F with chronic RLQ abdominal pain and appendicolith/early appendicitis on CT.    Abx, NPO, plan for dx laparoscopy with appendectomy and any other  necessary and indicated procedures. Informed consent was obtained after detailed explanation of risks,  including bleeding, infection, abscess, staple line leak, stump appendicitis, injury to surrounding structures, and need for conversion to open procedure. All questions answered to the patient's satisfaction. Patient advised to remove belly button ring pre-op. Recommend c-scope no earlier than 6w post-op for overdue colon cancer screening. Will need a note for work for lifting restrictions.   Diamantina MonksAyesha N. Tamicka Shimon, MD General and Trauma Surgery [facility]

## 2021-02-19 NOTE — Interval H&P Note (Signed)
History and Physical Interval Note:  02/19/2021 7:13 AM  [name]  has presented today for surgery, with the diagnosis of CHRONIC APPENDICITIS.  The various methods of treatment have been discussed with the patient and family. After consideration of risks, benefits and other options for treatment, the patient has consented to  Procedure(s): APPENDECTOMY LAPAROSCOPIC (N/A) as a surgical intervention.  The patient's history has been reviewed, patient examined, no change in status, stable for surgery.  I have reviewed the patient's chart and labs.  Questions were answered to the patient's satisfaction.     [name]

## 2021-02-19 NOTE — Op Note (Signed)
Diagnostic laparoscopy, lysis of adhesions, laparoscopic appendectomy Procedure Note  Indications: This is a 55 year old female who presents with a couple of months of intermittent right lower quadrant abdominal pain, nausea, vomiting.  2 weeks ago she had a CT scan showing a fecalith within the proximal appendix and findings of possible early appendicitis.  However her symptoms improved and she was referred to our office for elective appendectomy.  Over the last few days her symptoms have returned.  White count is normal.  She denies any fever.  She was transferred to [facility] and will undergo diagnostic laparoscopy and appendectomy.    Pre-operative Diagnosis: Chronic appendicitis  Post-operative Diagnosis: Same, intra-abdominal adhesions  Surgeon: [name]   Assistants: Aquilla Solian, MD, PGY-3 I was personally present during the key and critical portions of this procedure and immediately available throughout the entire procedure, as documented in my operative note.   Anesthesia: General endotracheal anesthesia  ASA Class: 1  Procedure Details  The patient was seen again in the Holding Room. The risks, benefits, complications, treatment options, and expected outcomes were discussed with the patient and/or family. The possibilities of reaction to medication, perforation of viscus, bleeding, recurrent infection, finding a normal appendix, the need for additional procedures, failure to diagnose a condition, and creating a complication requiring transfusion or operation were discussed. There was concurrence with the proposed plan and informed consent was obtained. The site of surgery was properly noted. The patient was taken to Operating Room, identified as [name] and the procedure verified as Appendectomy. A Time Out was held and the above information confirmed.  The patient was placed in the supine position and general anesthesia was induced.  The abdomen was  prepped and draped in a sterile fashion. A one centimeter supraumbilical incision was made.  Dissection was carried down to the fascia bluntly.  The fascia was incised vertically.  We entered the peritoneal cavity bluntly.  A pursestring suture was passed around the incision with a 0 Vicryl.  The Hasson cannula was introduced into the abdomen and the tails of the suture were used to hold the Hasson in place.   The pneumoperitoneum was then established maintaining a maximum pressure of 15 mmHg.  A careful evaluation of the entire abdomen was carried out. The patient was placed in Trendelenburg and left lateral decubitus position.  She has significant omental and small bowel adhesions to the anterior abdominal wall in the lower abdominal, especially in the right lower quadrant.  A 5 mm port was placed in the right upper quadrant. The scope was moved to the right upper quadrant port site.  We used blunt as well as sharp dissection to take down these omental adhesions.  We took down the small bowel adhesions to the anterior abdominal wall.  There are some omental adhesions in the pelvis that we left alone since there was no bowel involvement.  The cecum was mobilized medially.  The appendix was identified.  There were some adhesions to the lateral abdominal wall.  We took down these adhesions with the harmonic scalpel.  The appendix was carefully dissected. The appendix was skeletonized with the harmonic scalpel.   The appendix was divided at its base using an endo-GIA stapler. Minimal appendiceal stump was left in place. There was no evidence of bleeding, leakage, or complication after division of the appendix. Irrigation was also performed and irrigate suctioned from the abdomen as well.  The umbilical port site was closed with the purse string  suture. There was no residual palpable fascial defect.  The trocar site skin wounds were closed with 4-0 Monocryl.  Instrument, sponge, and needle counts were correct at  the conclusion of the case.   Findings: The appendix was found to be mildly inflamed. There were not signs of necrosis.  There was not perforation. There was not abscess formation.  Estimated Blood Loss:  Minimal         Drains: none         Specimens: appendix         Complications:  None; patient tolerated the procedure well.         Disposition: PACU - hemodynamically stable.         Condition: stable  [name]. Annette Skains, MD, [facility]  General Surgery   02/19/2021 8:45 AM

## 2021-02-19 NOTE — Anesthesia Procedure Notes (Signed)
Procedure Name: Intubation Date/Time: 02/19/2021 7:32 AM Performed by: Marny Lowenstein, CRNA Pre-anesthesia Checklist: Patient identified, Emergency Drugs available, Suction available and Patient being monitored Patient Re-evaluated:Patient Re-evaluated prior to induction Oxygen Delivery Method: Circle system utilized Preoxygenation: Pre-oxygenation with 100% oxygen Induction Type: IV induction Ventilation: Mask ventilation without difficulty Laryngoscope Size: Miller and 2 Grade View: Grade I Tube type: Oral Tube size: 7.0 mm Number of attempts: 1 Airway Equipment and Method: Patient positioned with wedge pillow and Stylet Placement Confirmation: ETT inserted through vocal cords under direct vision, positive ETCO2 and breath sounds checked- equal and bilateral Secured at: 21 cm Tube secured with: Tape Dental Injury: Teeth and Oropharynx as per pre-operative assessment

## 2021-02-20 ENCOUNTER — Encounter (HOSPITAL_COMMUNITY): Payer: Self-pay | Admitting: Surgery

## 2021-02-20 LAB — SURGICAL PATHOLOGY

## 2021-05-29 ENCOUNTER — Encounter: Payer: 59 | Admitting: Internal Medicine

## 2021-09-08 ENCOUNTER — Other Ambulatory Visit: Payer: Self-pay | Admitting: Internal Medicine

## 2021-09-08 DIAGNOSIS — F418 Other specified anxiety disorders: Secondary | ICD-10-CM

## 2021-11-18 ENCOUNTER — Other Ambulatory Visit: Payer: Self-pay | Admitting: Internal Medicine

## 2021-11-18 ENCOUNTER — Ambulatory Visit (HOSPITAL_COMMUNITY)
Admission: EM | Admit: 2021-11-18 | Discharge: 2021-11-18 | Disposition: A | Discharge status: Home / Self Care | Payer: 59 | Attending: Family Medicine | Admitting: Family Medicine

## 2021-11-18 ENCOUNTER — Encounter (HOSPITAL_COMMUNITY): Payer: Self-pay | Admitting: Emergency Medicine

## 2021-11-18 ENCOUNTER — Ambulatory Visit (INDEPENDENT_AMBULATORY_CARE_PROVIDER_SITE_OTHER): Payer: 59

## 2021-11-18 DIAGNOSIS — F418 Other specified anxiety disorders: Secondary | ICD-10-CM

## 2021-11-18 DIAGNOSIS — R051 Acute cough: Secondary | ICD-10-CM

## 2021-11-18 DIAGNOSIS — R0602 Shortness of breath: Secondary | ICD-10-CM

## 2021-11-18 DIAGNOSIS — J069 Acute upper respiratory infection, unspecified: Secondary | ICD-10-CM | POA: Diagnosis not present

## 2021-11-18 DIAGNOSIS — R059 Cough, unspecified: Secondary | ICD-10-CM

## 2021-11-18 DIAGNOSIS — R062 Wheezing: Secondary | ICD-10-CM

## 2021-11-18 MED ORDER — PROMETHAZINE-DM 6.25-15 MG/5ML PO SYRP: 5.0000 mL | ORAL_SOLUTION | Freq: Four times a day (QID) | ORAL | 0 refills | Status: DC | PRN

## 2021-11-18 MED ORDER — PREDNISONE 20 MG PO TABS: 40.0000 mg | ORAL_TABLET | Freq: Every day | ORAL | 0 refills | Status: DC

## 2021-11-18 NOTE — Telephone Encounter (Signed)
Last appointment 02/09/2021.  No future appointments

## 2021-11-18 NOTE — ED Provider Notes (Addendum)
[facility]   [id number] 11/18/21 Arrival Time: 0904  ASSESSMENT & PLAN:  1. Acute cough   2. SOB (shortness of breath)   3. Wheezing   4. Viral URI    I have personally viewed the imaging studies ordered this visit. CXR: no acute changes; no infiltrates.  Discussed typical duration of viral illnesses. OTC symptom care as needed.  New Prescriptions   PREDNISONE (DELTASONE) 20 MG TABLET    Take 2 tablets (40 mg total) by mouth daily.   PROMETHAZINE-DEXTROMETHORPHAN (PROMETHAZINE-DM) 6.25-15 MG/5ML SYRUP    Take 5 mLs by mouth 4 (four) times daily as needed for cough.   Work note provided.  Reviewed expectations re: course of current medical issues. Questions answered. Outlined signs and symptoms indicating need for more acute intervention. Understanding verbalized. After Visit Summary given.   SUBJECTIVE: History from: Patient. [name] is a 56 y.o. female. Reports: fatigue; cough; nasal congestion; abrupt onset 48-72 hours ago. With reported temp >100F. Also feeling SOB with exertion. No CP. Denies: headache. Normal PO intake without n/v/d. Reports home COVID test negative.  Social History   Tobacco Use  Smoking Status Never  Smokeless Tobacco Never    OBJECTIVE:  Vitals:   11/18/21 0940  BP: (!) 142/75  Pulse: 100  Resp: 18  Temp: 98.5 F (36.9 C)  SpO2: 95%    General appearance: alert; no distress Eyes: PERRLA; EOMI; conjunctiva normal HENT: [place]; AT; with nasal congestion Neck: supple  Lungs: speaks full sentences without difficulty; unlabored; coarse breath sounds bilaterally with exp wheezing Extremities: no edema Skin: warm and dry Neurologic: normal gait Psychological: alert and cooperative; normal mood and affect   Imaging: DG Chest 2 View  Result Date: 11/18/2021 CLINICAL DATA:  Shortness of breath, cough. EXAM: CHEST - 2 VIEW COMPARISON:  September 19, 2007. FINDINGS: The heart size and mediastinal contours are within normal  limits. Both lungs are clear. The visualized skeletal structures are unremarkable. IMPRESSION: No active cardiopulmonary disease. Electronically Signed   By: Lupita Raider M.D.   On: 11/18/2021 10:39     Allergies  Allergen Reactions   Codeine Itching    Past Medical History:  Diagnosis Date   Hypertension    Migraines    Social History   Socioeconomic History   Marital status: Legally Separated    Spouse name: Not on file   Number of children: Not on file   Years of education: Not on file   Highest education level: Not on file  Occupational History   Not on file  Tobacco Use   Smoking status: Never   Smokeless tobacco: Never  Vaping Use   Vaping Use: Never used  Substance and Sexual Activity   Alcohol use: No   Drug use: No   Sexual activity: Not on file  Other Topics Concern   Not on file  Social History Narrative   Not on file   Social Determinants of Health   Financial Resource Strain: Not on file  Food Insecurity: Not on file  Transportation Needs: Not on file  Physical Activity: Not on file  Stress: Not on file  Social Connections: Not on file  Intimate Partner Violence: Not on file   Family History  Problem Relation Age of Onset   Hypertension Mother    Hypertension Father    Past Surgical History:  Procedure Laterality Date   ABDOMINAL HYSTERECTOMY     CHOLECYSTECTOMY     CYST EXCISION  LAPAROSCOPIC APPENDECTOMY N/A 02/19/2021   Procedure: APPENDECTOMY LAPAROSCOPIC;  Surgeon: Manus Rudd, MD;  Location: [facility] OR;  Service: General;  Laterality: N/A;   LAPAROSCOPY  02/19/2021   Procedure: LAPAROSCOPY DIAGNOSTIC;  Surgeon: Manus Rudd, MD;  Location: [facility] OR;  Service: General;;   LYSIS OF ADHESION  02/19/2021   Procedure: LYSIS OF ADHESION;  Surgeon: Manus Rudd, MD;  Location: [facility] OR;  Service: General;;     Mardella Layman, MD 11/18/21 1044    Mardella Layman, MD 11/18/21 1044

## 2021-11-18 NOTE — ED Triage Notes (Signed)
Pt is present today with cough, nasal congestion, and fever. Pt sx started Monday night

## 2021-11-20 ENCOUNTER — Other Ambulatory Visit: Payer: Self-pay | Admitting: Internal Medicine

## 2021-11-20 DIAGNOSIS — F418 Other specified anxiety disorders: Secondary | ICD-10-CM

## 2022-01-02 ENCOUNTER — Other Ambulatory Visit: Payer: Self-pay | Admitting: Internal Medicine

## 2022-01-02 DIAGNOSIS — F418 Other specified anxiety disorders: Secondary | ICD-10-CM

## 2022-01-04 ENCOUNTER — Encounter: Payer: Self-pay | Admitting: Internal Medicine

## 2022-03-29 ENCOUNTER — Encounter: Payer: Self-pay | Admitting: Family Medicine

## 2022-03-29 ENCOUNTER — Ambulatory Visit (INDEPENDENT_AMBULATORY_CARE_PROVIDER_SITE_OTHER): Payer: 59 | Admitting: Family Medicine

## 2022-03-29 VITALS — BP 142/72 | HR 109 | Ht 66.0 in | Wt 134.0 lb

## 2022-03-29 DIAGNOSIS — E038 Other specified hypothyroidism: Secondary | ICD-10-CM

## 2022-03-29 DIAGNOSIS — Z1211 Encounter for screening for malignant neoplasm of colon: Secondary | ICD-10-CM

## 2022-03-29 DIAGNOSIS — F32A Depression, unspecified: Secondary | ICD-10-CM | POA: Insufficient documentation

## 2022-03-29 DIAGNOSIS — Z2821 Immunization not carried out because of patient refusal: Secondary | ICD-10-CM

## 2022-03-29 DIAGNOSIS — Z23 Encounter for immunization: Secondary | ICD-10-CM

## 2022-03-29 DIAGNOSIS — Z1231 Encounter for screening mammogram for malignant neoplasm of breast: Secondary | ICD-10-CM

## 2022-03-29 DIAGNOSIS — F419 Anxiety disorder, unspecified: Secondary | ICD-10-CM

## 2022-03-29 DIAGNOSIS — I10 Essential (primary) hypertension: Secondary | ICD-10-CM | POA: Diagnosis not present

## 2022-03-29 DIAGNOSIS — G43109 Migraine with aura, not intractable, without status migrainosus: Secondary | ICD-10-CM

## 2022-03-29 DIAGNOSIS — Z1159 Encounter for screening for other viral diseases: Secondary | ICD-10-CM

## 2022-03-29 DIAGNOSIS — R7301 Impaired fasting glucose: Secondary | ICD-10-CM

## 2022-03-29 DIAGNOSIS — F418 Other specified anxiety disorders: Secondary | ICD-10-CM | POA: Diagnosis not present

## 2022-03-29 DIAGNOSIS — E559 Vitamin D deficiency, unspecified: Secondary | ICD-10-CM

## 2022-03-29 MED ORDER — HYDROXYZINE PAMOATE 25 MG PO CAPS: 25.0000 mg | ORAL_CAPSULE | Freq: Every evening | ORAL | 0 refills | Status: DC | PRN

## 2022-03-29 MED ORDER — ATENOLOL 100 MG PO TABS: 100.0000 mg | ORAL_TABLET | Freq: Every day | ORAL | 1 refills | Status: DC

## 2022-03-29 NOTE — Assessment & Plan Note (Signed)
Uncontrolled Will reinstate atenolol 100 mg tablet daily The patient denies chest pain, palpitations, and shortness of breath Dash eating plan reviewed Encourage low-sodium intake, recommend less than 1500 mg daily intake of sodium Encouraged to increase her physical activities We will follow up on blood pressure in 1 month Pending CMP

## 2022-03-29 NOTE — Progress Notes (Signed)
New Patient Office Visit  Subjective:  Patient ID: [name], female    DOB: 03-25-1966  Age: 56 y.o. MRN: [id number]  CC:  Chief Complaint  Patient presents with   Anxiety    Pt starting new care wants Marcel Gary to be her primary .THEARTFAILURE pt stated she has problems sleeping and a history of mirgranes.   Hypertension   Depression    HPI [name] is a 56 y.o. female with past medical history of hypertension, migraines, anxiety and depression presents for establishing care.  Anxiety and depression: Reports that she used to take Paxil 10 mg daily but stopped therapy due to minimal relief of her symptoms.  She denies suicidal ideation and homicidal ideations today.  She reports that she has been managing her symptoms without medications.  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  She would like to be started on medications to help with sleep during the day.   Migraines: She reports a history of migraines and has had over 6 episodes this year.  She reports taking Goody powders for relief of headaches.    Hypertension: She reports that she used to take atenolol 100 mg daily for her blood pressure.  She stopped therapy and has not been on the medication for about a year.  She denies chest pain, palpitations, shortness of breath, and visual disturbances.  Past Medical History:  Diagnosis Date   Hypertension    Migraines     Past Surgical History:  Procedure Laterality Date   ABDOMINAL HYSTERECTOMY     CHOLECYSTECTOMY     CYST EXCISION     LAPAROSCOPIC APPENDECTOMY N/A 02/19/2021   Procedure: APPENDECTOMY LAPAROSCOPIC;  Surgeon: Donnie Mesa, MD;  Location: [place] OR;  Service: General;  Laterality: N/A;   LAPAROSCOPY  02/19/2021   Procedure: LAPAROSCOPY DIAGNOSTIC;  Surgeon: Donnie Mesa, MD;  Location: [place];  Service: General;;   LYSIS OF ADHESION  02/19/2021   Procedure: LYSIS OF ADHESION;  Surgeon: Donnie Mesa, MD;  Location:  [place];  Service: General;;    Family History  Problem Relation Age of Onset   Hypertension Mother    Hypertension Father     Social History   Socioeconomic History   Marital status: Legally Separated    Spouse name: Not on file   Number of children: Not on file   Years of education: Not on file   Highest education level: Not on file  Occupational History   Not on file  Tobacco Use   Smoking status: Never   Smokeless tobacco: Never  Vaping Use   Vaping Use: Never used  Substance and Sexual Activity   Alcohol use: No   Drug use: No   Sexual activity: Not on file  Other Topics Concern   Not on file  Social History Narrative   Not on file   Social Determinants of Health   Financial Resource Strain: Not on file  Food Insecurity: Not on file  Transportation Needs: Not on file  Physical Activity: Not on file  Stress: Not on file  Social Connections: Not on file  Intimate Partner Violence: Not on file    ROS Review of Systems  Constitutional:  Negative for chills, fatigue and fever.  HENT:  Negative for rhinorrhea, sinus pressure and sinus pain.   Eyes:  Negative for redness and visual disturbance.  Respiratory:  Negative for chest tightness and shortness of breath.   Cardiovascular:  Negative for chest pain  and palpitations.  Gastrointestinal:  Negative for constipation, diarrhea and nausea.  Endocrine: Negative for polydipsia, polyphagia and polyuria.  Genitourinary:  Negative for vaginal bleeding, vaginal discharge and vaginal pain.  Musculoskeletal:  Negative for myalgias and neck pain.  Skin:  Negative for rash and wound.  Neurological:  Negative for dizziness and numbness.  Psychiatric/Behavioral:  Positive for sleep disturbance. Negative for behavioral problems, confusion, self-injury and suicidal ideas.     Objective:   Today's Vitals: BP (!) 142/72 (BP Location: Left Arm)   Pulse (!) 109   Ht _0  (1.676 m)   Wt 134 lb (60.8 kg)   SpO2 99%   BMI  21.63 kg/m   Physical Exam HENT:     Head: Normocephalic.     Right Ear: External ear normal.     Left Ear: External ear normal.     Nose: No congestion.     Mouth/Throat:     Mouth: Mucous membranes are moist.  Eyes:     Extraocular Movements: Extraocular movements intact.     Pupils: Pupils are equal, round, and reactive to light.  Cardiovascular:     Rate and Rhythm: Normal rate and regular rhythm.     Pulses: Normal pulses.     Heart sounds: Normal heart sounds.  Pulmonary:     Effort: Pulmonary effort is normal.     Breath sounds: Normal breath sounds.  Abdominal:     Tenderness: There is no right CVA tenderness or left CVA tenderness.  Musculoskeletal:     Cervical back: No rigidity.     Right lower leg: No edema.     Left lower leg: No edema.  Skin:    Findings: No lesion or rash.  Neurological:     Mental Status: She is alert and oriented to person, place, and time.  Psychiatric:     Comments: Normal affect     Assessment & Plan:   Problem List Items Addressed This Visit       Cardiovascular and Mediastinum   Migraines    History of migraine headaches No episode today Will get labs and assess kidney function as she has been taking Goody powders for her migraine headaches       Relevant Medications   atenolol (TENORMIN) 100 MG tablet   Hypertension - Primary    Uncontrolled Will reinstate atenolol 100 mg tablet daily The patient denies chest pain, palpitations, and shortness of breath Dash eating plan reviewed Encourage low-sodium intake, recommend less than 1500 mg daily intake of sodium Encouraged to increase her physical activities We will follow up on blood pressure in 1 month Pending CMP       Relevant Medications   atenolol (TENORMIN) 100 MG tablet     Other   Situational anxiety   Relevant Medications   atenolol (TENORMIN) 100 MG tablet   Anxiety and depression    She denies suicidal ideation and homicidal ideation She denies  starting antianxiety and antidepression medications today We will continue to monitor      Other Visit Diagnoses     Colon cancer screening       Relevant Orders   Cologuard   Immunization refused       Vitamin D deficiency       Relevant Orders   Vitamin D (25 hydroxy)   IFG (impaired fasting glucose)       Relevant Orders   CBC with Differential/Platelet   CMP14+EGFR   Hemoglobin A1C   Lipid Profile  Other specified hypothyroidism       Relevant Medications   atenolol (TENORMIN) 100 MG tablet   Other Relevant Orders   TSH + free T4   Need for hepatitis C screening test       Relevant Orders   Hepatitis C Antibody   Breast cancer screening by mammogram       Relevant Orders   MM 3D SCREEN BREAST BILATERAL   Need for immunization against influenza       Relevant Orders   Flu Vaccine QUAD 48moIM (Fluarix, Fluzone & Alfiuria Quad PF) (Completed)       Outpatient Encounter Medications as of 03/29/2022  Medication Sig   acetaminophen (TYLENOL) 500 MG tablet Take 1,000 mg by mouth every 6 (six) hours as needed for mild pain.   Melatonin 3 MG TABS Take 3 mg by mouth as needed (sleep).   PARoxetine (PAXIL) 10 MG tablet Take 1 tablet by mouth once daily   [DISCONTINUED] atenolol (TENORMIN) 100 MG tablet Take 1 tablet by mouth once daily   atenolol (TENORMIN) 100 MG tablet Take 1 tablet (100 mg total) by mouth daily.   baclofen (LIORESAL) 10 MG tablet Take 1 tablet (10 mg total) by mouth 2 (two) times daily as needed for muscle spasms. (Patient not taking: Reported on 03/29/2022)   Multiple Vitamins-Minerals (ONE-A-DAY WOMENS 50 PLUS) TABS Take 1 tablet by mouth 3 (three) times a week. (Patient not taking: Reported on 03/29/2022)   predniSONE (DELTASONE) 20 MG tablet Take 2 tablets (40 mg total) by mouth daily. (Patient not taking: Reported on 03/29/2022)   promethazine-dextromethorphan (PROMETHAZINE-DM) 6.25-15 MG/5ML syrup Take 5 mLs by mouth 4 (four) times daily as needed for  cough. (Patient not taking: Reported on 03/29/2022)   No facility-administered encounter medications on file as of 03/29/2022.    Follow-up: Return in about 1 month (around 04/29/2022) for BP.   GAlvira Monday FNP

## 2022-03-29 NOTE — Assessment & Plan Note (Signed)
History of migraine headaches No episode today Will get labs and assess kidney function as she has been taking Goody powders for her migraine headaches

## 2022-03-29 NOTE — Patient Instructions (Addendum)
I appreciate the opportunity to provide care to you today!    Follow up:  1 months  Please schedule Mammogram today.  Fasting labs: please stop by the lab today to get your blood drawn (CBC, CMP, TSH, Lipid profile, HgA1c, Vit D)  Screening:  Hep C  Please complete cologuard kit when you receive it in the mail, we will call you when results are in.    Please pick up your medication at the pharmacy  Please continue to a heart-healthy diet and increase your physical activities. Try to exercise for 32mns at least three times a week.      It was a pleasure to see you and I look forward to continuing to work together on your health and well-being. Please do not hesitate to call the office if you need care or have questions about your care.   Have a wonderful day and week. With Gratitude, GAlvira MondayMSN, FNP-BC

## 2022-03-29 NOTE — Assessment & Plan Note (Signed)
She denies suicidal ideation and homicidal ideation She denies starting antianxiety and antidepression medications today We will continue to monitor

## 2022-03-29 NOTE — Addendum Note (Signed)
Addended byAlvira Monday on: 03/29/2022 01:48 PM   Modules accepted: Orders

## 2022-03-30 LAB — HEMOGLOBIN A1C
Est. average glucose Bld gHb Est-mCnc: 128 mg/dL
Hgb A1c MFr Bld: 6.1 % — ABNORMAL HIGH (ref 4.8–5.6)

## 2022-03-30 LAB — LIPID PANEL
Chol/HDL Ratio: 3.6 ratio (ref 0.0–4.4)
Cholesterol, Total: 213 mg/dL — ABNORMAL HIGH (ref 100–199)
HDL: 60 mg/dL (ref >39)
LDL Chol Calc (NIH): 133 mg/dL — ABNORMAL HIGH (ref 0–99)
Triglycerides: 111 mg/dL (ref 0–149)
VLDL Cholesterol Cal: 20 mg/dL (ref 5–40)

## 2022-03-30 LAB — CMP14+EGFR
ALT: 22 IU/L (ref 0–32)
AST: 22 IU/L (ref 0–40)
Albumin/Globulin Ratio: 1.3 (ref 1.2–2.2)
Albumin: 4.1 g/dL (ref 3.8–4.9)
Alkaline Phosphatase: 70 IU/L (ref 44–121)
BUN/Creatinine Ratio: 16 (ref 9–23)
BUN: 14 mg/dL (ref 6–24)
Bilirubin Total: 0.3 mg/dL (ref 0.0–1.2)
CO2: 22 mmol/L (ref 20–29)
Calcium: 10 mg/dL (ref 8.7–10.2)
Chloride: 105 mmol/L (ref 96–106)
Creatinine, Ser: 0.85 mg/dL (ref 0.57–1.00)
Globulin, Total: 3.1 g/dL (ref 1.5–4.5)
Glucose: 90 mg/dL (ref 70–99)
Potassium: 4.5 mmol/L (ref 3.5–5.2)
Sodium: 142 mmol/L (ref 134–144)
Total Protein: 7.2 g/dL (ref 6.0–8.5)
eGFR: 81 mL/min/{1.73_m2} (ref >59)

## 2022-03-30 LAB — CBC WITH DIFFERENTIAL/PLATELET
Basophils Absolute: 0.1 10*3/uL (ref 0.0–0.2)
Basos: 2 %
EOS (ABSOLUTE): 0.3 10*3/uL (ref 0.0–0.4)
Eos: 5 %
Hematocrit: 37.6 % (ref 34.0–46.6)
Hemoglobin: 12.2 g/dL (ref 11.1–15.9)
Immature Grans (Abs): 0 10*3/uL (ref 0.0–0.1)
Immature Granulocytes: 0 %
Lymphocytes Absolute: 1.8 10*3/uL (ref 0.7–3.1)
Lymphs: 32 %
MCH: 28.8 pg (ref 26.6–33.0)
MCHC: 32.4 g/dL (ref 31.5–35.7)
MCV: 89 fL (ref 79–97)
Monocytes Absolute: 0.5 10*3/uL (ref 0.1–0.9)
Monocytes: 10 %
Neutrophils Absolute: 2.9 10*3/uL (ref 1.4–7.0)
Neutrophils: 51 %
Platelets: 367 10*3/uL (ref 150–450)
RBC: 4.24 x10E6/uL (ref 3.77–5.28)
RDW: 14.1 % (ref 11.7–15.4)
WBC: 5.7 10*3/uL (ref 3.4–10.8)

## 2022-03-30 LAB — VITAMIN D 25 HYDROXY (VIT D DEFICIENCY, FRACTURES): Vit D, 25-Hydroxy: 18.9 ng/mL — ABNORMAL LOW (ref 30.0–100.0)

## 2022-03-30 LAB — HEPATITIS C ANTIBODY: Hep C Virus Ab: NONREACTIVE

## 2022-03-30 LAB — TSH+FREE T4
Free T4: 1.17 ng/dL (ref 0.82–1.77)
TSH: 1.07 u[IU]/mL (ref 0.450–4.500)

## 2022-03-31 ENCOUNTER — Other Ambulatory Visit: Payer: Self-pay | Admitting: Family Medicine

## 2022-03-31 DIAGNOSIS — E559 Vitamin D deficiency, unspecified: Secondary | ICD-10-CM

## 2022-03-31 MED ORDER — VITAMIN D (ERGOCALCIFEROL) 1.25 MG (50000 UNIT) PO CAPS: 50000.0000 [IU] | ORAL_CAPSULE | ORAL | 2 refills | Status: DC

## 2022-03-31 NOTE — Progress Notes (Signed)
Please inform the patient that her hemoglobin A1c is 6.1.she is considered prediabetic.  I recommend decreasing her intake of high sugary foods with increased physical activities.  Her cholesterol is elevated.  I recommend low carbs and low fat diet.  I have sent a prescription for weekly vitamin D supplements to her pharmacy to begin taking.  Her vitamin D is low

## 2022-04-05 ENCOUNTER — Ambulatory Visit (HOSPITAL_COMMUNITY): Payer: 59

## 2022-04-12 ENCOUNTER — Ambulatory Visit (HOSPITAL_COMMUNITY): Payer: 59

## 2022-04-27 ENCOUNTER — Other Ambulatory Visit: Payer: Self-pay | Admitting: Family Medicine

## 2022-04-30 ENCOUNTER — Ambulatory Visit: Payer: 59 | Admitting: Family Medicine

## 2022-05-04 ENCOUNTER — Encounter: Payer: Self-pay | Admitting: Family Medicine

## 2022-05-04 ENCOUNTER — Ambulatory Visit (INDEPENDENT_AMBULATORY_CARE_PROVIDER_SITE_OTHER): Payer: 59 | Admitting: Family Medicine

## 2022-05-04 VITALS — BP 122/74 | HR 99 | Ht 66.0 in | Wt 133.1 lb

## 2022-05-04 DIAGNOSIS — F418 Other specified anxiety disorders: Secondary | ICD-10-CM

## 2022-05-04 DIAGNOSIS — I10 Essential (primary) hypertension: Secondary | ICD-10-CM | POA: Diagnosis not present

## 2022-05-04 MED ORDER — ATENOLOL 100 MG PO TABS: 100.0000 mg | ORAL_TABLET | Freq: Every day | ORAL | 1 refills | Status: DC

## 2022-05-04 NOTE — Assessment & Plan Note (Signed)
Controlled Encouraged to continue taking atenolol 100 mg daily Refill sent to the pharmacy to be picked up Changes made to treatment regimen today Encouraged to continue heart healthy diet and increase physical activities Encouraged to continue low-sodium diet BP Readings from Last 3 Encounters:  05/04/22 122/74  03/29/22 (!) 142/72  11/18/21 (!) 142/75

## 2022-05-04 NOTE — Progress Notes (Signed)
Established Patient Office Visit  Subjective:  Patient ID: [name], female    DOB: [birth year]  Age: 56 y.o. MRN: [id number]  CC:  Chief Complaint  Patient presents with   Follow-up    1 month f/u, would like to discuss lab results.     HPI [name] is a 56 y.o. female with past medical history of migraines, headaches, and situational anxiety presents for f/u of  chronic medical conditions.  Hypertension: She takes atenolol 100 mg daily.  She denies headaches, dizziness, blurred vision, chest pain, palpitation.  She reports adherence with treatment regimen.   Past Medical History:  Diagnosis Date   Hypertension    Migraines     Past Surgical History:  Procedure Laterality Date   ABDOMINAL HYSTERECTOMY     CHOLECYSTECTOMY     CYST EXCISION     LAPAROSCOPIC APPENDECTOMY N/A 02/19/2021   Procedure: APPENDECTOMY LAPAROSCOPIC;  Surgeon: Donnie Mesa, MD;  Location: [place] OR;  Service: General;  Laterality: N/A;   LAPAROSCOPY  02/19/2021   Procedure: LAPAROSCOPY DIAGNOSTIC;  Surgeon: Donnie Mesa, MD;  Location: [place];  Service: General;;   LYSIS OF ADHESION  02/19/2021   Procedure: LYSIS OF ADHESION;  Surgeon: Donnie Mesa, MD;  Location: [place];  Service: General;;    Family History  Problem Relation Age of Onset   Hypertension Mother    Hypertension Father     Social History   Socioeconomic History   Marital status: Legally Separated    Spouse name: Not on file   Number of children: Not on file   Years of education: Not on file   Highest education level: Not on file  Occupational History   Not on file  Tobacco Use   Smoking status: Never   Smokeless tobacco: Never  Vaping Use   Vaping Use: Never used  Substance and Sexual Activity   Alcohol use: No   Drug use: No   Sexual activity: Not on file  Other Topics Concern   Not on file  Social History Narrative   Not on file   Social Determinants of Health   Financial Resource Strain: Not  on file  Food Insecurity: Not on file  Transportation Needs: Not on file  Physical Activity: Not on file  Stress: Not on file  Social Connections: Not on file  Intimate Partner Violence: Not on file    Outpatient Medications Prior to Visit  Medication Sig Dispense Refill   acetaminophen (TYLENOL) 500 MG tablet Take 1,000 mg by mouth every 6 (six) hours as needed for mild pain.     hydrOXYzine (VISTARIL) 25 MG capsule TAKE 1 CAPSULE BY MOUTH AT BEDTIME AS NEEDED 30 capsule 0   Melatonin 3 MG TABS Take 3 mg by mouth as needed (sleep).     PARoxetine (PAXIL) 10 MG tablet Take 1 tablet by mouth once daily 30 tablet 0   Vitamin D, Ergocalciferol, (DRISDOL) 1.25 MG (50000 UNIT) CAPS capsule Take 1 capsule (50,000 Units total) by mouth every 7 (seven) days. 5 capsule 2   atenolol (TENORMIN) 100 MG tablet Take 1 tablet (100 mg total) by mouth daily. 30 tablet 1   predniSONE (DELTASONE) 20 MG tablet Take 2 tablets (40 mg total) by mouth daily. 10 tablet 0   baclofen (LIORESAL) 10 MG tablet Take 1 tablet (10 mg total) by mouth 2 (two) times daily as needed for muscle spasms. (Patient not taking: Reported on 03/29/2022) 30 each 0   Multiple Vitamins-Minerals (  ONE-A-DAY WOMENS 50 PLUS) TABS Take 1 tablet by mouth 3 (three) times a week. (Patient not taking: Reported on 03/29/2022)     promethazine-dextromethorphan (PROMETHAZINE-DM) 6.25-15 MG/5ML syrup Take 5 mLs by mouth 4 (four) times daily as needed for cough. (Patient not taking: Reported on 03/29/2022) 118 mL 0   No facility-administered medications prior to visit.    Allergies  Allergen Reactions   Codeine Itching    ROS Review of Systems  Constitutional:  Negative for chills, fatigue and fever.  Eyes:  Negative for visual disturbance.  Respiratory:  Negative for chest tightness and shortness of breath.   Cardiovascular:  Negative for chest pain and palpitations.  Neurological:  Negative for dizziness and headaches.      Objective:     Physical Exam HENT:     Head: Normocephalic.  Cardiovascular:     Rate and Rhythm: Normal rate and regular rhythm.     Pulses: Normal pulses.     Heart sounds: Normal heart sounds.  Pulmonary:     Effort: Pulmonary effort is normal.     Breath sounds: Normal breath sounds. No wheezing.  Abdominal:     Palpations: Abdomen is soft.  Musculoskeletal:     Cervical back: No rigidity.  Neurological:     Mental Status: She is alert.     BP 122/74   Pulse 99   Ht _0  (1.676 m)   Wt 133 lb 1.9 oz (60.4 kg)   SpO2 100%   BMI 21.49 kg/m  Wt Readings from Last 3 Encounters:  05/04/22 133 lb 1.9 oz (60.4 kg)  03/29/22 134 lb (60.8 kg)  02/19/21 132 lb (59.9 kg)    Lab Results  Component Value Date   TSH 1.070 03/29/2022   Lab Results  Component Value Date   WBC 5.7 03/29/2022   HGB 12.2 03/29/2022   HCT 37.6 03/29/2022   MCV 89 03/29/2022   PLT 367 03/29/2022   Lab Results  Component Value Date   NA 142 03/29/2022   K 4.5 03/29/2022   CO2 22 03/29/2022   GLUCOSE 90 03/29/2022   BUN 14 03/29/2022   CREATININE 0.85 03/29/2022   BILITOT 0.3 03/29/2022   ALKPHOS 70 03/29/2022   AST 22 03/29/2022   ALT 22 03/29/2022   PROT 7.2 03/29/2022   ALBUMIN 4.1 03/29/2022   CALCIUM 10.0 03/29/2022   ANIONGAP 9 02/19/2021   EGFR 81 03/29/2022   Lab Results  Component Value Date   CHOL 213 (H) 03/29/2022   Lab Results  Component Value Date   HDL 60 03/29/2022   Lab Results  Component Value Date   LDLCALC 133 (H) 03/29/2022   Lab Results  Component Value Date   TRIG 111 03/29/2022   Lab Results  Component Value Date   CHOLHDL 3.6 03/29/2022   Lab Results  Component Value Date   HGBA1C 6.1 (H) 03/29/2022      Assessment & Plan:   Problem List Items Addressed This Visit       Cardiovascular and Mediastinum   Hypertension - Primary    Controlled Encouraged to continue taking atenolol 100 mg daily Refill sent to the pharmacy to be picked up Changes  made to treatment regimen today Encouraged to continue heart healthy diet and increase physical activities Encouraged to continue low-sodium diet BP Readings from Last 3 Encounters:  05/04/22 122/74  03/29/22 (!) 142/72  11/18/21 (!) 142/75         Relevant Medications  atenolol (TENORMIN) 100 MG tablet     Other   Situational anxiety   Relevant Medications   atenolol (TENORMIN) 100 MG tablet    Meds ordered this encounter  Medications   atenolol (TENORMIN) 100 MG tablet    Sig: Take 1 tablet (100 mg total) by mouth daily.    Dispense:  90 tablet    Refill:  1    Follow-up: Return in about 2 months (around 07/04/2022).    Alvira Monday, FNP

## 2022-05-04 NOTE — Patient Instructions (Addendum)
I appreciate the opportunity to provide care to you today!    Follow up:  2 months  Please complete your COLOGUARD and mail it back, we will call you with results.  Keep up the good work with your blood pressure  Please pick up your medications at the pharmacy   Please continue to a heart-healthy diet and increase your physical activities. Try to exercise for at least three times a week.      It was a pleasure to see you and I look forward to continuing to work together on your health and well-being. Please do not hesitate to call the office if you need care or have questions about your care.   Have a wonderful day and week. With Gratitude, Gilmore Laroche MSN, FNP-BC

## 2022-06-04 ENCOUNTER — Other Ambulatory Visit: Payer: Self-pay | Admitting: Family Medicine

## 2022-07-06 ENCOUNTER — Ambulatory Visit (INDEPENDENT_AMBULATORY_CARE_PROVIDER_SITE_OTHER): Payer: 59 | Admitting: Family Medicine

## 2022-07-06 ENCOUNTER — Encounter: Payer: Self-pay | Admitting: Family Medicine

## 2022-07-06 VITALS — BP 152/88 | HR 98 | Ht 66.0 in | Wt 138.0 lb

## 2022-07-06 DIAGNOSIS — Z1211 Encounter for screening for malignant neoplasm of colon: Secondary | ICD-10-CM | POA: Diagnosis not present

## 2022-07-06 DIAGNOSIS — Z114 Encounter for screening for human immunodeficiency virus [HIV]: Secondary | ICD-10-CM

## 2022-07-06 DIAGNOSIS — R7301 Impaired fasting glucose: Secondary | ICD-10-CM

## 2022-07-06 DIAGNOSIS — I10 Essential (primary) hypertension: Secondary | ICD-10-CM | POA: Diagnosis not present

## 2022-07-06 DIAGNOSIS — E038 Other specified hypothyroidism: Secondary | ICD-10-CM

## 2022-07-06 DIAGNOSIS — F418 Other specified anxiety disorders: Secondary | ICD-10-CM | POA: Diagnosis not present

## 2022-07-06 DIAGNOSIS — R7303 Prediabetes: Secondary | ICD-10-CM | POA: Diagnosis not present

## 2022-07-06 DIAGNOSIS — E7849 Other hyperlipidemia: Secondary | ICD-10-CM

## 2022-07-06 DIAGNOSIS — Z1159 Encounter for screening for other viral diseases: Secondary | ICD-10-CM

## 2022-07-06 DIAGNOSIS — E559 Vitamin D deficiency, unspecified: Secondary | ICD-10-CM

## 2022-07-06 MED ORDER — OLMESARTAN MEDOXOMIL 20 MG PO TABS: 20.0000 mg | ORAL_TABLET | Freq: Every day | ORAL | 1 refills | Status: DC

## 2022-07-06 MED ORDER — HYDROXYZINE PAMOATE 25 MG PO CAPS: 25.0000 mg | ORAL_CAPSULE | Freq: Every evening | ORAL | 2 refills | Status: DC | PRN

## 2022-07-06 NOTE — Assessment & Plan Note (Signed)
No thoughts of suicidal ideation Refill hydralazine 25 mg to take as needed nightly

## 2022-07-06 NOTE — Assessment & Plan Note (Signed)
She takes atenolol 100 mg daily History of a heart murmur She reports increased life stresses, and poor adherence with DASH diet Encouraged low-sodium diet increase physical activities Encouraged the patient to assess her blood pressure daily and bring ambulatory readings with her at her next appointment We will follow-up in 2 weeks We will make adjustments to her antihypertensive if BP is elevated at her next appointment

## 2022-07-06 NOTE — Progress Notes (Signed)
Established Patient Office Visit  Subjective:  Patient ID: [name], female    DOB: 07/12/1965  Age: 57 y.o. MRN: [id number]  CC:  Chief Complaint  Patient presents with   Follow-up    Pt here for follow up. Would like to discuss sx of feeling thirsty and constantly eating.     HPI [name] is a 57 y.o. female with past medical history of prediabetes, hypertension, and situational anxiety presents for f/u of  chronic medical conditions. For the details of today's visit, please refer to the assessment and plan.    Past Medical History:  Diagnosis Date   Hypertension    Migraines     Past Surgical History:  Procedure Laterality Date   ABDOMINAL HYSTERECTOMY     CHOLECYSTECTOMY     CYST EXCISION     LAPAROSCOPIC APPENDECTOMY N/A 02/19/2021   Procedure: APPENDECTOMY LAPAROSCOPIC;  Surgeon: Manus Rudd, MD;  Location: [facility] OR;  Service: General;  Laterality: N/A;   LAPAROSCOPY  02/19/2021   Procedure: LAPAROSCOPY DIAGNOSTIC;  Surgeon: Manus Rudd, MD;  Location: [facility] OR;  Service: General;;   LYSIS OF ADHESION  02/19/2021   Procedure: LYSIS OF ADHESION;  Surgeon: Manus Rudd, MD;  Location: [facility] OR;  Service: General;;    Family History  Problem Relation Age of Onset   Hypertension Mother    Hypertension Father     Social History   Socioeconomic History   Marital status: Legally Separated    Spouse name: Not on file   Number of children: Not on file   Years of education: Not on file   Highest education level: Not on file  Occupational History   Not on file  Tobacco Use   Smoking status: Never   Smokeless tobacco: Never  Vaping Use   Vaping Use: Never used  Substance and Sexual Activity   Alcohol use: No   Drug use: No   Sexual activity: Not on file  Other Topics Concern   Not on file  Social History Narrative   Not on file   Social Determinants of Health   Financial Resource Strain: Not on file  Food Insecurity: Not on file   Transportation Needs: Not on file  Physical Activity: Not on file  Stress: Not on file  Social Connections: Not on file  Intimate Partner Violence: Not on file    Outpatient Medications Prior to Visit  Medication Sig Dispense Refill   acetaminophen (TYLENOL) 500 MG tablet Take 1,000 mg by mouth every 6 (six) hours as needed for mild pain.     atenolol (TENORMIN) 100 MG tablet Take 1 tablet (100 mg total) by mouth daily. 90 tablet 1   Melatonin 3 MG TABS Take 3 mg by mouth as needed (sleep).     PARoxetine (PAXIL) 10 MG tablet Take 1 tablet by mouth once daily 30 tablet 0   Vitamin D, Ergocalciferol, (DRISDOL) 1.25 MG (50000 UNIT) CAPS capsule Take 1 capsule (50,000 Units total) by mouth every 7 (seven) days. 5 capsule 2   hydrOXYzine (VISTARIL) 25 MG capsule TAKE 1 CAPSULE BY MOUTH AT BEDTIME AS NEEDED 30 capsule 0   No facility-administered medications prior to visit.    Allergies  Allergen Reactions   Codeine Itching    ROS Review of Systems  Constitutional:  Negative for chills and fever.  Eyes:  Negative for visual disturbance.  Respiratory:  Negative for chest tightness and shortness of breath.   Endocrine: Positive for polydipsia, polyphagia and  polyuria.  Neurological:  Negative for dizziness and headaches.      Objective:    Physical Exam HENT:     Head: Normocephalic.     Mouth/Throat:     Mouth: Mucous membranes are moist.  Cardiovascular:     Rate and Rhythm: Normal rate.     Heart sounds: Normal heart sounds.  Pulmonary:     Effort: Pulmonary effort is normal.     Breath sounds: Normal breath sounds.  Neurological:     Mental Status: She is alert.     BP (!) 152/88 (BP Location: Left Arm)   Pulse 98   Ht 5\' 6"  (1.676 m)   Wt 138 lb 0.6 oz (62.6 kg)   SpO2 93%   BMI 22.28 kg/m  Wt Readings from Last 3 Encounters:  07/06/22 138 lb 0.6 oz (62.6 kg)  05/04/22 133 lb 1.9 oz (60.4 kg)  03/29/22 134 lb (60.8 kg)    Lab Results  Component Value  Date   TSH 1.070 03/29/2022   Lab Results  Component Value Date   WBC 5.7 03/29/2022   HGB 12.2 03/29/2022   HCT 37.6 03/29/2022   MCV 89 03/29/2022   PLT 367 03/29/2022   Lab Results  Component Value Date   NA 142 03/29/2022   K 4.5 03/29/2022   CO2 22 03/29/2022   GLUCOSE 90 03/29/2022   BUN 14 03/29/2022   CREATININE 0.85 03/29/2022   BILITOT 0.3 03/29/2022   ALKPHOS 70 03/29/2022   AST 22 03/29/2022   ALT 22 03/29/2022   PROT 7.2 03/29/2022   ALBUMIN 4.1 03/29/2022   CALCIUM 10.0 03/29/2022   ANIONGAP 9 02/19/2021   EGFR 81 03/29/2022   Lab Results  Component Value Date   CHOL 213 (H) 03/29/2022   Lab Results  Component Value Date   HDL 60 03/29/2022   Lab Results  Component Value Date   LDLCALC 133 (H) 03/29/2022   Lab Results  Component Value Date   TRIG 111 03/29/2022   Lab Results  Component Value Date   CHOLHDL 3.6 03/29/2022   Lab Results  Component Value Date   HGBA1C 6.1 (H) 03/29/2022      Assessment & Plan:  Prediabetes Assessment & Plan: Patient was prediabetic on 03/29/2022 She complains of polyuria, polydipsia, polyphagia Will assess hemoglobin A1c today   Primary hypertension Assessment & Plan: She takes atenolol 100 mg daily History of a heart murmur She reports increased life stresses, and poor adherence with DASH diet Encouraged low-sodium diet increase physical activities Encouraged the patient to assess her blood pressure daily and bring ambulatory readings with her at her next appointment We will follow-up in 2 weeks We will make adjustments to her antihypertensive if BP is elevated at her next appointment    Situational anxiety Assessment & Plan: No thoughts of suicidal ideation Refill hydralazine 25 mg to take as needed nightly  Orders: -     hydrOXYzine Pamoate; Take 1 capsule (25 mg total) by mouth at bedtime as needed.  Dispense: 30 capsule; Refill: 2  Colon cancer screening -     Fecal occult blood,  imunochemical  IFG (impaired fasting glucose) -     Hemoglobin A1c  Vitamin D deficiency -     VITAMIN D 25 Hydroxy (Vit-D Deficiency, Fractures)  Need for hepatitis C screening test -     Hepatitis C antibody  Encounter for screening for HIV -     HIV Antibody (routine testing w rflx)  Other specified hypothyroidism -     TSH + free T4  Other hyperlipidemia -     Lipid panel -     CMP14+EGFR -     CBC with Differential/Platelet    Follow-up: Return in about 2 weeks (around 07/20/2022) for BP.   Alvira Monday, FNP

## 2022-07-06 NOTE — Patient Instructions (Signed)
I appreciate the opportunity to provide care to you today!    Follow up:  2 weeks  Labs: please stop by the lab today to get your blood drawn (CBC, CMP, TSH, Lipid profile, HgA1c, Vit D)   Please check your BP at home and  bring the readings with you at your next appt  I recommend low sodium diet with increased physical activity   Please continue to a heart-healthy diet and increase your physical activities. Try to exercise for 72mins at least five times a week.      It was a pleasure to see you and I look forward to continuing to work together on your health and well-being. Please do not hesitate to call the office if you need care or have questions about your care.   Have a wonderful day and week. With Gratitude, Alvira Monday MSN, FNP-BC

## 2022-07-06 NOTE — Assessment & Plan Note (Signed)
Patient was prediabetic on 03/29/2022 She complains of polyuria, polydipsia, polyphagia Will assess hemoglobin A1c today

## 2022-07-11 ENCOUNTER — Other Ambulatory Visit: Payer: Self-pay | Admitting: Family Medicine

## 2022-07-11 DIAGNOSIS — E559 Vitamin D deficiency, unspecified: Secondary | ICD-10-CM

## 2022-07-12 ENCOUNTER — Other Ambulatory Visit: Payer: Self-pay | Admitting: Family Medicine

## 2022-07-12 DIAGNOSIS — E559 Vitamin D deficiency, unspecified: Secondary | ICD-10-CM

## 2022-07-12 MED ORDER — VITAMIN D (ERGOCALCIFEROL) 1.25 MG (50000 UNIT) PO CAPS: 50000.0000 [IU] | ORAL_CAPSULE | ORAL | 1 refills | Status: DC

## 2022-07-12 NOTE — Telephone Encounter (Signed)
Rx sent 

## 2022-07-13 LAB — CBC WITH DIFFERENTIAL/PLATELET
Basophils Absolute: 0.1 10*3/uL (ref 0.0–0.2)
Basos: 2 %
EOS (ABSOLUTE): 0.3 10*3/uL (ref 0.0–0.4)
Eos: 6 %
Hematocrit: 38.7 % (ref 34.0–46.6)
Hemoglobin: 12.3 g/dL (ref 11.1–15.9)
Immature Grans (Abs): 0 10*3/uL (ref 0.0–0.1)
Immature Granulocytes: 0 %
Lymphocytes Absolute: 1.8 10*3/uL (ref 0.7–3.1)
Lymphs: 36 %
MCH: 28.8 pg (ref 26.6–33.0)
MCHC: 31.8 g/dL (ref 31.5–35.7)
MCV: 91 fL (ref 79–97)
Monocytes Absolute: 0.5 10*3/uL (ref 0.1–0.9)
Monocytes: 10 %
Neutrophils Absolute: 2.4 10*3/uL (ref 1.4–7.0)
Neutrophils: 46 %
Platelets: 366 10*3/uL (ref 150–450)
RBC: 4.27 x10E6/uL (ref 3.77–5.28)
RDW: 13.9 % (ref 11.7–15.4)
WBC: 5.2 10*3/uL (ref 3.4–10.8)

## 2022-07-13 LAB — CMP14+EGFR
ALT: 9 IU/L (ref 0–32)
AST: 15 IU/L (ref 0–40)
Albumin/Globulin Ratio: 1.4 (ref 1.2–2.2)
Albumin: 4.2 g/dL (ref 3.8–4.9)
Alkaline Phosphatase: 67 IU/L (ref 44–121)
BUN/Creatinine Ratio: 13 (ref 9–23)
BUN: 11 mg/dL (ref 6–24)
Bilirubin Total: 0.5 mg/dL (ref 0.0–1.2)
CO2: 22 mmol/L (ref 20–29)
Calcium: 9.6 mg/dL (ref 8.7–10.2)
Chloride: 107 mmol/L — ABNORMAL HIGH (ref 96–106)
Creatinine, Ser: 0.87 mg/dL (ref 0.57–1.00)
Globulin, Total: 3 g/dL (ref 1.5–4.5)
Glucose: 88 mg/dL (ref 70–99)
Potassium: 4.1 mmol/L (ref 3.5–5.2)
Sodium: 143 mmol/L (ref 134–144)
Total Protein: 7.2 g/dL (ref 6.0–8.5)
eGFR: 79 mL/min/{1.73_m2} (ref >59)

## 2022-07-13 LAB — HEPATITIS C ANTIBODY: Hep C Virus Ab: NONREACTIVE

## 2022-07-13 LAB — VITAMIN D 25 HYDROXY (VIT D DEFICIENCY, FRACTURES): Vit D, 25-Hydroxy: 48.9 ng/mL (ref 30.0–100.0)

## 2022-07-13 LAB — LIPID PANEL
Chol/HDL Ratio: 3.1 ratio (ref 0.0–4.4)
Cholesterol, Total: 188 mg/dL (ref 100–199)
HDL: 61 mg/dL (ref >39)
LDL Chol Calc (NIH): 111 mg/dL — ABNORMAL HIGH (ref 0–99)
Triglycerides: 89 mg/dL (ref 0–149)
VLDL Cholesterol Cal: 16 mg/dL (ref 5–40)

## 2022-07-13 LAB — TSH+FREE T4
Free T4: 1.2 ng/dL (ref 0.82–1.77)
TSH: 0.551 u[IU]/mL (ref 0.450–4.500)

## 2022-07-13 LAB — HEMOGLOBIN A1C
Est. average glucose Bld gHb Est-mCnc: 128 mg/dL
Hgb A1c MFr Bld: 6.1 % — ABNORMAL HIGH (ref 4.8–5.6)

## 2022-07-13 LAB — HIV ANTIBODY (ROUTINE TESTING W REFLEX): HIV Screen 4th Generation wRfx: NONREACTIVE

## 2022-07-20 ENCOUNTER — Encounter: Payer: Self-pay | Admitting: Internal Medicine

## 2022-07-20 ENCOUNTER — Ambulatory Visit (INDEPENDENT_AMBULATORY_CARE_PROVIDER_SITE_OTHER): Payer: 59 | Admitting: Internal Medicine

## 2022-07-20 VITALS — BP 150/82 | HR 92 | Ht 66.0 in | Wt 138.0 lb

## 2022-07-20 DIAGNOSIS — I10 Essential (primary) hypertension: Secondary | ICD-10-CM

## 2022-07-20 DIAGNOSIS — G43019 Migraine without aura, intractable, without status migrainosus: Secondary | ICD-10-CM | POA: Diagnosis not present

## 2022-07-20 MED ORDER — SUMATRIPTAN SUCCINATE 100 MG PO TABS: 100.0000 mg | ORAL_TABLET | ORAL | 0 refills | Status: DC | PRN

## 2022-07-20 MED ORDER — OLMESARTAN MEDOXOMIL 40 MG PO TABS: 40.0000 mg | ORAL_TABLET | Freq: Every day | ORAL | 1 refills | Status: DC

## 2022-07-20 NOTE — Assessment & Plan Note (Signed)
Patient has unilateral headaches with nausea. These headaches last greater than 4 hours and improved with rest and taking BC powder.   Plan: - SUMAtriptan (IMITREX) 100 MG tablet; Take 1 tablet (100 mg total) by mouth every 2 (two) hours as needed for migraine. May repeat in 2 hours if headache persists or recurs.  Dispense: 10 tablet; Refill: 0 - Follow up if symptoms worsen or fail to improve

## 2022-07-20 NOTE — Assessment & Plan Note (Signed)
BP today 150/82 on second check.  Her systolic blood pressure ranges from 132 to 152 on home 10 home checks.  She will bring her blood pressure cuff to next visit to check against our readings. She is taking atenolol 100 mg daily and olmesartan 20 mg daily.  Plan: - Blood pressure above goal of 140/80. Increase olmesartan and follow up in one month - olmesartan (BENICAR) 40 MG tablet; Take 1 tablet (40 mg total) by mouth daily.  Dispense: 30 tablet; Refill: 1 - BMP8+EGFR

## 2022-07-20 NOTE — Patient Instructions (Signed)
Thank you for trusting me with your care. To recap, today we discussed the following:   Migraine headaches  - SUMAtriptan (IMITREX) 100 MG tablet; Take 1 tablet (100 mg total) by mouth every 2 (two) hours as needed for migraine. May repeat in 2 hours if headache persists or recurs.  Dispense: 10 tablet; Refill: 0  hypertension - increase medications as follows:  olmesartan (BENICAR) 40 MG tablet; Take 1 tablet (40 mg total) by mouth daily.  Dispense: 30 tablet; Refill: 1

## 2022-07-20 NOTE — Progress Notes (Signed)
   HPI:[name] is a 57 y.o. female who presents for evaluation of follow up of hypertension and has acute concern of headache. For the details of today's visit, please refer to the assessment and plan.  Physical Exam: Vitals:   07/20/22 1019 07/20/22 1022  BP: (!) 161/84 (!) 150/82  Pulse: 92   SpO2: 94%   Weight: 138 lb (62.6 kg)   Height: 5\' 6"  (1.676 m)      Physical Exam Constitutional:      Appearance: She is well-developed and well-groomed.     Comments: Appears to be having some pain from headache  Eyes:     General: No scleral icterus.    Conjunctiva/sclera: Conjunctivae normal.  Cardiovascular:     Rate and Rhythm: Normal rate and regular rhythm.     Heart sounds: No murmur heard.    No friction rub. No gallop.  Pulmonary:     Effort: Pulmonary effort is normal.     Breath sounds: No wheezing, rhonchi or rales.  Musculoskeletal:     Right lower leg: No edema.     Left lower leg: No edema.  Skin:    General: Skin is warm and dry.      Assessment & Plan:   Migraines Patient has unilateral headaches with nausea. These headaches last greater than 4 hours and improved with rest and taking BC powder.   Plan: - SUMAtriptan (IMITREX) 100 MG tablet; Take 1 tablet (100 mg total) by mouth every 2 (two) hours as needed for migraine. May repeat in 2 hours if headache persists or recurs.  Dispense: 10 tablet; Refill: 0 - Follow up if symptoms worsen or fail to improve     Hypertension BP today 150/82 on second check.  Her systolic blood pressure ranges from 132 to 152 on home 10 home checks.  She will bring her blood pressure cuff to next visit to check against our readings. She is taking atenolol 100 mg daily and olmesartan 20 mg daily.  Plan: - Blood pressure above goal of 140/80. Increase olmesartan and follow up in one month - olmesartan (BENICAR) 40 MG tablet; Take 1 tablet (40 mg total) by mouth daily.  Dispense: 30 tablet; Refill: 1 -  BMP8+EGFR    Lorene Dy, MD

## 2022-07-21 LAB — BMP8+EGFR
BUN/Creatinine Ratio: 16 (ref 9–23)
BUN: 14 mg/dL (ref 6–24)
CO2: 22 mmol/L (ref 20–29)
Calcium: 9.4 mg/dL (ref 8.7–10.2)
Chloride: 106 mmol/L (ref 96–106)
Creatinine, Ser: 0.87 mg/dL (ref 0.57–1.00)
Glucose: 92 mg/dL (ref 70–99)
Potassium: 4.4 mmol/L (ref 3.5–5.2)
Sodium: 143 mmol/L (ref 134–144)
eGFR: 79 mL/min/{1.73_m2} (ref >59)

## 2022-08-03 ENCOUNTER — Encounter: Payer: Self-pay | Admitting: Emergency Medicine

## 2022-08-03 ENCOUNTER — Ambulatory Visit
Admission: EM | Admit: 2022-08-03 | Discharge: 2022-08-03 | Disposition: A | Discharge status: Home / Self Care | Payer: Managed Care, Other (non HMO) | Attending: Nurse Practitioner | Admitting: Nurse Practitioner

## 2022-08-03 ENCOUNTER — Ambulatory Visit: Payer: Managed Care, Other (non HMO)

## 2022-08-03 DIAGNOSIS — R509 Fever, unspecified: Secondary | ICD-10-CM | POA: Diagnosis not present

## 2022-08-03 DIAGNOSIS — J101 Influenza due to other identified influenza virus with other respiratory manifestations: Secondary | ICD-10-CM

## 2022-08-03 DIAGNOSIS — R059 Cough, unspecified: Secondary | ICD-10-CM | POA: Diagnosis not present

## 2022-08-03 DIAGNOSIS — J189 Pneumonia, unspecified organism: Secondary | ICD-10-CM | POA: Diagnosis not present

## 2022-08-03 LAB — POCT INFLUENZA A/B
Influenza A, POC: POSITIVE — AB
Influenza B, POC: NEGATIVE

## 2022-08-03 LAB — POCT RAPID STREP A (OFFICE): Rapid Strep A Screen: NEGATIVE

## 2022-08-03 MED ORDER — PREDNISONE 20 MG PO TABS: 20.0000 mg | ORAL_TABLET | Freq: Every day | ORAL | 0 refills | Status: AC

## 2022-08-03 MED ORDER — AZITHROMYCIN 250 MG PO TABS: 250.0000 mg | ORAL_TABLET | Freq: Every day | ORAL | 0 refills | Status: DC

## 2022-08-03 MED ORDER — AMOXICILLIN-POT CLAVULANATE 875-125 MG PO TABS: 1.0000 | ORAL_TABLET | Freq: Two times a day (BID) | ORAL | 0 refills | Status: AC

## 2022-08-03 MED ORDER — PROMETHAZINE-DM 6.25-15 MG/5ML PO SYRP: 5.0000 mL | ORAL_SOLUTION | Freq: Four times a day (QID) | ORAL | 0 refills | Status: DC | PRN

## 2022-08-03 MED ORDER — IBUPROFEN 800 MG PO TABS
800.0000 mg | ORAL_TABLET | Freq: Once | ORAL | Status: AC
  Administered 2022-08-03: 800 mg via ORAL

## 2022-08-03 NOTE — Discharge Instructions (Addendum)
You have tested positive for influenza A and it appears that you have pneumonia in the right lung. Take medication as prescribed. Increase fluids and allow for plenty of rest. Recommend over-the-counter Tylenol or ibuprofen every 8 hours as needed for pain, fever, general discomfort. Use a humidifier in your bedroom at nighttime during sleep and sleep elevated on pillows while cough symptoms persist. Warm salt water gargles 3-4 times daily to help with throat pain or discomfort. Monitor your symptoms for any worsening.  If you develop worsening shortness of breath, difficulty breathing, or fever is not controlled with medication, please follow-up in the emergency department immediately for further evaluation. Recommend following up with your primary care physician within the next 7 to 10 days for reevaluation. Follow-up as needed.

## 2022-08-03 NOTE — ED Provider Notes (Signed)
[facility]    CSN: [id number] Arrival date & time: 08/03/22  [id number]      History   Chief Complaint No chief complaint on file.   HPI [name] is a 57 y.o. female.   The history is provided by the patient.   The patient presents for a 3-day history of fever, chills, body aches, headache, sore throat, nasal congestion, runny nose, and cough.  Patient denies ear pain, wheezing, shortness of breath, difficulty breathing, nausea, vomiting, or diarrhea.  Patient states she recently traveled on a flight and was around someone who was coughing.  She states she told him it was "allergies".  She states since that time she has been ill.  She has been taking over-the-counter cough and cold medications such as TheraFlu for her symptoms.  States she took a home COVID test which was negative.  Past Medical History:  Diagnosis Date   Hypertension    Migraines     Patient Active Problem List   Diagnosis Date Noted   Prediabetes 07/06/2022   Anxiety and depression 03/29/2022   History of laparoscopic appendectomy 02/19/2021   Elevated serum hCG 02/09/2021   Situational anxiety 12/21/2019   Migraines 11/20/2019   Hypertension 11/20/2019   Cough 11/20/2019   Tachycardia 11/20/2019    Past Surgical History:  Procedure Laterality Date   ABDOMINAL HYSTERECTOMY     CHOLECYSTECTOMY     CYST EXCISION     LAPAROSCOPIC APPENDECTOMY N/A 02/19/2021   Procedure: APPENDECTOMY LAPAROSCOPIC;  Surgeon: Donnie Mesa, MD;  Location: [place];  Service: General;  Laterality: N/A;   LAPAROSCOPY  02/19/2021   Procedure: LAPAROSCOPY DIAGNOSTIC;  Surgeon: Donnie Mesa, MD;  Location: [place];  Service: General;;   LYSIS OF ADHESION  02/19/2021   Procedure: LYSIS OF ADHESION;  Surgeon: Donnie Mesa, MD;  Location: [place];  Service: General;;    OB History   No obstetric history on file.      Home Medications    Prior to Admission medications   Medication Sig Start Date End Date  Taking? Authorizing Provider  amoxicillin-clavulanate (AUGMENTIN) 875-125 MG tablet Take 1 tablet by mouth every 12 (twelve) hours for 5 days. 08/03/22 08/08/22 Yes Seddrick Flax-Warren, Alda Lea, NP  azithromycin (ZITHROMAX) 250 MG tablet Take 1 tablet (250 mg total) by mouth daily. Take first 2 tablets together, then 1 every day until finished. 08/03/22  Yes Jamarcus Laduke-Warren, Alda Lea, NP  predniSONE (DELTASONE) 20 MG tablet Take 1 tablet (20 mg total) by mouth daily with breakfast for 5 days. 08/03/22 08/08/22 Yes Pranav Lince-Warren, Alda Lea, NP  promethazine-dextromethorphan (PROMETHAZINE-DM) 6.25-15 MG/5ML syrup Take 5 mLs by mouth 4 (four) times daily as needed for cough. 08/03/22  Yes Nicholai Willette-Warren, Alda Lea, NP  acetaminophen (TYLENOL) 500 MG tablet Take 1,000 mg by mouth every 6 (six) hours as needed for mild pain.    [provider]  atenolol (TENORMIN) 100 MG tablet Take 1 tablet (100 mg total) by mouth daily. 05/04/22   Alvira Monday, FNP  hydrOXYzine (VISTARIL) 25 MG capsule Take 1 capsule (25 mg total) by mouth at bedtime as needed. 07/06/22   Alvira Monday, FNP  olmesartan (BENICAR) 40 MG tablet Take 1 tablet (40 mg total) by mouth daily. 07/20/22   Lyndal Pulley, MD  PARoxetine (PAXIL) 10 MG tablet Take 1 tablet by mouth once daily 01/04/22   Farrel Gordon, DO  SUMAtriptan (IMITREX) 100 MG tablet Take 1 tablet (100 mg total) by mouth every 2 (two) hours  as needed for migraine. May repeat in 2 hours if headache persists or recurs. 07/20/22   Lyndal Pulley, MD  Vitamin D, Ergocalciferol, (DRISDOL) 1.25 MG (50000 UNIT) CAPS capsule Take 1 capsule (50,000 Units total) by mouth every 7 (seven) days. 07/12/22   Alvira Monday, FNP    Family History Family History  Problem Relation Age of Onset   Hypertension Mother    Hypertension Father     Social History Social History   Tobacco Use   Smoking status: Never   Smokeless tobacco: Never  Vaping Use   Vaping Use: Never used  Substance  Use Topics   Alcohol use: No   Drug use: No     Allergies   Codeine   Review of Systems Review of Systems Per HPI  Physical Exam Triage Vital Signs ED Triage Vitals  Enc Vitals Group     BP 08/03/22 0837 (!) 156/79     Pulse Rate 08/03/22 0837 (!) 106     Resp 08/03/22 0837 18     Temp 08/03/22 0837 (!) 103.1 F (39.5 C)     Temp Source 08/03/22 0837 Oral     SpO2 08/03/22 0837 94 %     Weight --      Height --      Head Circumference --      Peak Flow --      Pain Score 08/03/22 0838 7     Pain Loc --      Pain Edu? --      Excl. in [place]? --    No data found.  Updated Vital Signs BP (!) 156/79 (BP Location: Right Arm)   Pulse (!) 106   Temp (!) 103.1 F (39.5 C) (Oral)   Resp 18   SpO2 94%   Visual Acuity Right Eye Distance:   Left Eye Distance:   Bilateral Distance:    Right Eye Near:   Left Eye Near:    Bilateral Near:     Physical Exam Vitals and nursing note reviewed.  Constitutional:      General: She is not in acute distress.    Appearance: Normal appearance.  HENT:     Head: Normocephalic.     Right Ear: Tympanic membrane, ear canal and external ear normal.     Left Ear: Tympanic membrane, ear canal and external ear normal.     Nose: Congestion present. No rhinorrhea.     Right Turbinates: Enlarged and swollen.     Left Turbinates: Enlarged and swollen.     Right Sinus: No maxillary sinus tenderness or frontal sinus tenderness.     Left Sinus: No maxillary sinus tenderness or frontal sinus tenderness.     Mouth/Throat:     Lips: Pink.     Pharynx: Uvula midline. Posterior oropharyngeal erythema present. No pharyngeal swelling or oropharyngeal exudate.  Eyes:     Extraocular Movements: Extraocular movements intact.     Pupils: Pupils are equal, round, and reactive to light.  Cardiovascular:     Rate and Rhythm: Normal rate and regular rhythm.     Pulses: Normal pulses.     Heart sounds: Normal heart sounds.  Pulmonary:     Effort:  Pulmonary effort is normal. No respiratory distress.     Breath sounds: Normal breath sounds. No stridor. No wheezing, rhonchi or rales.  Abdominal:     General: Bowel sounds are normal.     Palpations: Abdomen is soft.     Tenderness: There  is no abdominal tenderness.  Musculoskeletal:     Cervical back: Normal range of motion.  Lymphadenopathy:     Cervical: No cervical adenopathy.  Skin:    General: Skin is warm and dry.  Neurological:     General: No focal deficit present.     Mental Status: She is alert and oriented to person, place, and time.  Psychiatric:        Mood and Affect: Mood normal.        Behavior: Behavior normal.      UC Treatments / Results  Labs (all labs ordered are listed, but only abnormal results are displayed) Labs Reviewed  POCT INFLUENZA A/B - Abnormal; Notable for the following components:      Result Value   Influenza A, POC Positive (*)    All other components within normal limits  POCT RAPID STREP A (OFFICE)    EKG   Radiology DG Chest 2 View  Result Date: 08/03/2022 CLINICAL DATA:  Cough and fever. EXAM: CHEST - 2 VIEW COMPARISON:  11/18/2021 FINDINGS: Heart size appears normal. No pleural fluid or airspace disease. Central airway thickening. Lingular and right middle lobe peribronchial opacities identified. Visualized osseous structures are unremarkable. IMPRESSION: 1. Peribronchial opacities in the lingula and right middle lobe concerning for pneumonia. 2. Central airway thickening. Electronically Signed   By: Kerby Moors M.D.   On: 08/03/2022 09:38    Procedures Procedures (including critical care time)  Medications Ordered in UC Medications  ibuprofen (ADVIL) tablet 800 mg (800 mg Oral Given 08/03/22 0839)    Initial Impression / Assessment and Plan / UC Course  I have reviewed the triage vital signs and the nursing notes.  Pertinent labs & imaging results that were available during my care of the patient were reviewed by me  and considered in my medical decision making (see chart for details).  Patient presents tachycardic, febrile, and hypertensive.  She is in no acute distress however.  Positive influenza A test, along with a right middle lobe pneumonia on chest x-ray.  Will start patient on Augmentin 875/125 twice daily for 5 days along with azithromycin 250 mg for treatment of pneumonia.  For patient's cough, Promethazine DM was prescribed along with prednisone 20 mg.  Supportive care recommendations were provided to the patient along with strict ER follow-up precautions.  Patient was advised to follow-up with her PCP within the next 7 to 10 days for reevaluation.  Patient is in agreement with this plan of care and verbalizes understanding.  All questions were answered.  Patient stable for discharge.  Work note was provided.   Final Clinical Impressions(s) / UC Diagnoses   Final diagnoses:  Pneumonia of right middle lobe due to infectious organism  Influenza A     Discharge Instructions      You have tested positive for influenza A and it appears that you have pneumonia in the right lung. Take medication as prescribed. Increase fluids and allow for plenty of rest. Recommend over-the-counter Tylenol or ibuprofen every 8 hours as needed for pain, fever, general discomfort. Use a humidifier in your bedroom at nighttime during sleep and sleep elevated on pillows while cough symptoms persist. Warm salt water gargles 3-4 times daily to help with throat pain or discomfort. Monitor your symptoms for any worsening.  If you develop worsening shortness of breath, difficulty breathing, or fever is not controlled with medication, please follow-up in the emergency department immediately for further evaluation. Recommend following up with your primary  care physician within the next 7 to 10 days for reevaluation. Follow-up as needed.     ED Prescriptions     Medication Sig Dispense Auth. Provider    amoxicillin-clavulanate (AUGMENTIN) 875-125 MG tablet Take 1 tablet by mouth every 12 (twelve) hours for 5 days. 10 tablet Nathalee Smarr-Warren, Alda Lea, NP   azithromycin (ZITHROMAX) 250 MG tablet Take 1 tablet (250 mg total) by mouth daily. Take first 2 tablets together, then 1 every day until finished. 6 tablet Hawa Henly-Warren, Alda Lea, NP   promethazine-dextromethorphan (PROMETHAZINE-DM) 6.25-15 MG/5ML syrup Take 5 mLs by mouth 4 (four) times daily as needed for cough. 118 mL Colman Birdwell-Warren, Alda Lea, NP   predniSONE (DELTASONE) 20 MG tablet Take 1 tablet (20 mg total) by mouth daily with breakfast for 5 days. 5 tablet Mellisa Arshad-Warren, Alda Lea, NP      PDMP not reviewed this encounter.   Tish Men, NP 08/03/22 1015

## 2022-08-03 NOTE — ED Triage Notes (Signed)
Cough, headache and body aches since Sunday.  Home covid test yesterday was negative.  Has been taking thera flu and robitussin for symptoms

## 2022-08-17 ENCOUNTER — Other Ambulatory Visit: Payer: Self-pay | Admitting: Internal Medicine

## 2022-08-17 ENCOUNTER — Encounter: Payer: Self-pay | Admitting: Family Medicine

## 2022-08-17 ENCOUNTER — Ambulatory Visit: Payer: Managed Care, Other (non HMO) | Admitting: Family Medicine

## 2022-08-17 VITALS — BP 136/77 | HR 100 | Ht 66.0 in | Wt 137.1 lb

## 2022-08-17 DIAGNOSIS — R058 Other specified cough: Secondary | ICD-10-CM | POA: Diagnosis not present

## 2022-08-17 DIAGNOSIS — I10 Essential (primary) hypertension: Secondary | ICD-10-CM | POA: Diagnosis not present

## 2022-08-17 DIAGNOSIS — J189 Pneumonia, unspecified organism: Secondary | ICD-10-CM

## 2022-08-17 DIAGNOSIS — G43019 Migraine without aura, intractable, without status migrainosus: Secondary | ICD-10-CM

## 2022-08-17 MED ORDER — SUMATRIPTAN SUCCINATE 100 MG PO TABS: 100.0000 mg | ORAL_TABLET | ORAL | 0 refills | Status: DC | PRN

## 2022-08-17 NOTE — Assessment & Plan Note (Signed)
Controlled She takes olmesartan 40 mg daily Patient is asymptomatic Low-sodium diet with increased physical activity encourage BP Readings from Last 3 Encounters:  08/17/22 136/77  08/03/22 (!) 156/79  07/20/22 (!) 150/82

## 2022-08-17 NOTE — Patient Instructions (Addendum)
I appreciate the opportunity to provide care to you today!    Follow up:  3 months  Labs: please stop by the lab today to get your blood drawn (CBC, BMP)  Please stop by [facility] on 09/14/22 to get an x-ray of your chest to assess for clearing of the pneumonia infection.    Please continue to a heart-healthy diet and increase your physical activities. Try to exercise for 22mns at least five times a week.      It was a pleasure to see you and I look forward to continuing to work together on your health and well-being. Please do not hesitate to call the office if you need care or have questions about your care.   Have a wonderful day and week. With Gratitude, GAlvira MondayMSN, FNP-BC

## 2022-08-17 NOTE — Assessment & Plan Note (Signed)
Patient was treated for pneumonia in the emergency department on 08/03/2022 She reports feeling better but complains of a lingering cough  Encourage the patient to take Robitussin over-the-counter Encouraged to report to any pain hospital for chest x-ray to assess for pneumonia clearing on September 14, 2022

## 2022-08-17 NOTE — Progress Notes (Signed)
Established Patient Office Visit  Subjective:  Patient ID: [name], female    DOB: Nov 13, 1965  Age: 57 y.o. MRN: [id number]  CC:  Chief Complaint  Patient presents with   Follow-up    4 week f/u for htn, reports doing well with bp, pt reports going to ED 08/03/22, was dx with pneumonia and flu, still having lingering cough.     HPI [name] is a 57 y.o. female with past medical history of hypertension, migraines, anxiety and depression presents for f/u of  chronic medical conditions. For the details of today's visit, please refer to the assessment and plan.     Past Medical History:  Diagnosis Date   Hypertension    Migraines     Past Surgical History:  Procedure Laterality Date   ABDOMINAL HYSTERECTOMY     CHOLECYSTECTOMY     CYST EXCISION     LAPAROSCOPIC APPENDECTOMY N/A 02/19/2021   Procedure: APPENDECTOMY LAPAROSCOPIC;  Surgeon: Donnie Mesa, MD;  Location: [facility];  Service: General;  Laterality: N/A;   LAPAROSCOPY  02/19/2021   Procedure: LAPAROSCOPY DIAGNOSTIC;  Surgeon: Donnie Mesa, MD;  Location: [facility];  Service: General;;   LYSIS OF ADHESION  02/19/2021   Procedure: LYSIS OF ADHESION;  Surgeon: Donnie Mesa, MD;  Location: [place];  Service: General;;    Family History  Problem Relation Age of Onset   Hypertension Mother    Hypertension Father     Social History   Socioeconomic History   Marital status: Legally Separated    Spouse name: Not on file   Number of children: Not on file   Years of education: Not on file   Highest education level: Not on file  Occupational History   Not on file  Tobacco Use   Smoking status: Never   Smokeless tobacco: Never  Vaping Use   Vaping Use: Never used  Substance and Sexual Activity   Alcohol use: No   Drug use: No   Sexual activity: Not on file  Other Topics Concern   Not on file  Social History Narrative   Not on file   Social Determinants of Health   Financial Resource Strain: Not  on file  Food Insecurity: Not on file  Transportation Needs: Not on file  Physical Activity: Not on file  Stress: Not on file  Social Connections: Not on file  Intimate Partner Violence: Not on file    Outpatient Medications Prior to Visit  Medication Sig Dispense Refill   acetaminophen (TYLENOL) 500 MG tablet Take 1,000 mg by mouth every 6 (six) hours as needed for mild pain.     atenolol (TENORMIN) 100 MG tablet Take 1 tablet (100 mg total) by mouth daily. 90 tablet 1   hydrOXYzine (VISTARIL) 25 MG capsule Take 1 capsule (25 mg total) by mouth at bedtime as needed. 30 capsule 2   olmesartan (BENICAR) 40 MG tablet Take 1 tablet (40 mg total) by mouth daily. 30 tablet 1   PARoxetine (PAXIL) 10 MG tablet Take 1 tablet by mouth once daily 30 tablet 0   SUMAtriptan (IMITREX) 100 MG tablet Take 1 tablet (100 mg total) by mouth every 2 (two) hours as needed for migraine. May repeat in 2 hours if headache persists or recurs. 10 tablet 0   Vitamin D, Ergocalciferol, (DRISDOL) 1.25 MG (50000 UNIT) CAPS capsule Take 1 capsule (50,000 Units total) by mouth every 7 (seven) days. 10 capsule 1   azithromycin (ZITHROMAX) 250 MG tablet  Take 1 tablet (250 mg total) by mouth daily. Take first 2 tablets together, then 1 every day until finished. 6 tablet 0   promethazine-dextromethorphan (PROMETHAZINE-DM) 6.25-15 MG/5ML syrup Take 5 mLs by mouth 4 (four) times daily as needed for cough. (Patient not taking: Reported on 08/17/2022) 118 mL 0   No facility-administered medications prior to visit.    Allergies  Allergen Reactions   Codeine Itching    ROS Review of Systems  Constitutional:  Negative for chills and fever.  Eyes:  Negative for visual disturbance.  Respiratory:  Positive for cough. Negative for chest tightness and shortness of breath.   Neurological:  Negative for dizziness and headaches.      Objective:    Physical Exam HENT:     Head: Normocephalic.     Mouth/Throat:     Mouth:  Mucous membranes are moist.  Cardiovascular:     Rate and Rhythm: Normal rate.     Heart sounds: Normal heart sounds.  Pulmonary:     Effort: Pulmonary effort is normal.     Breath sounds: Normal breath sounds.  Neurological:     Mental Status: She is alert.     BP 136/77   Pulse 100   Ht '5\' 6"'$  (1.676 m)   Wt 137 lb 1.3 oz (62.2 kg)   SpO2 94%   BMI 22.13 kg/m  Wt Readings from Last 3 Encounters:  08/17/22 137 lb 1.3 oz (62.2 kg)  07/20/22 138 lb (62.6 kg)  07/06/22 138 lb 0.6 oz (62.6 kg)    Lab Results  Component Value Date   TSH 0.551 07/12/2022   Lab Results  Component Value Date   WBC 5.2 07/12/2022   HGB 12.3 07/12/2022   HCT 38.7 07/12/2022   MCV 91 07/12/2022   PLT 366 07/12/2022   Lab Results  Component Value Date   NA 143 07/20/2022   K 4.4 07/20/2022   CO2 22 07/20/2022   GLUCOSE 92 07/20/2022   BUN 14 07/20/2022   CREATININE 0.87 07/20/2022   BILITOT 0.5 07/12/2022   ALKPHOS 67 07/12/2022   AST 15 07/12/2022   ALT 9 07/12/2022   PROT 7.2 07/12/2022   ALBUMIN 4.2 07/12/2022   CALCIUM 9.4 07/20/2022   ANIONGAP 9 02/19/2021   EGFR 79 07/20/2022   Lab Results  Component Value Date   CHOL 188 07/12/2022   Lab Results  Component Value Date   HDL 61 07/12/2022   Lab Results  Component Value Date   LDLCALC 111 (H) 07/12/2022   Lab Results  Component Value Date   TRIG 89 07/12/2022   Lab Results  Component Value Date   CHOLHDL 3.1 07/12/2022   Lab Results  Component Value Date   HGBA1C 6.1 (H) 07/12/2022      Assessment & Plan:  Primary hypertension Assessment & Plan: Controlled She takes olmesartan 40 mg daily Patient is asymptomatic Low-sodium diet with increased physical activity encourage BP Readings from Last 3 Encounters:  08/17/22 136/77  08/03/22 (!) 156/79  07/20/22 (!) 150/82     Orders: -     CBC -     BMP8+EGFR  Other cough Assessment & Plan: Patient was treated for pneumonia in the emergency department  on 08/03/2022 She reports feeling better but complains of a lingering cough  Encourage the patient to take Robitussin over-the-counter Encouraged to report to any pain hospital for chest x-ray to assess for pneumonia clearing on September 14, 2022    Pneumonia of right  middle lobe due to infectious organism -     DG Chest 2 View; Future    Follow-up: Return in about 3 months (around 11/15/2022).   Alvira Monday, FNP

## 2022-08-18 LAB — CBC
Hematocrit: 37.1 % (ref 34.0–46.6)
Hemoglobin: 12 g/dL (ref 11.1–15.9)
MCH: 29.3 pg (ref 26.6–33.0)
MCHC: 32.3 g/dL (ref 31.5–35.7)
MCV: 91 fL (ref 79–97)
Platelets: 399 10*3/uL (ref 150–450)
RBC: 4.09 x10E6/uL (ref 3.77–5.28)
RDW: 13.4 % (ref 11.7–15.4)
WBC: 7 10*3/uL (ref 3.4–10.8)

## 2022-08-18 NOTE — Progress Notes (Signed)
Please inform the patient that her CBC is stable

## 2022-08-21 ENCOUNTER — Other Ambulatory Visit: Payer: Self-pay | Admitting: Family Medicine

## 2022-08-21 DIAGNOSIS — G43019 Migraine without aura, intractable, without status migrainosus: Secondary | ICD-10-CM

## 2022-09-23 ENCOUNTER — Encounter (HOSPITAL_COMMUNITY): Payer: Self-pay

## 2022-09-23 ENCOUNTER — Ambulatory Visit (HOSPITAL_COMMUNITY)
Admission: EM | Admit: 2022-09-23 | Discharge: 2022-09-23 | Disposition: A | Discharge status: Home / Self Care | Payer: Managed Care, Other (non HMO) | Attending: Emergency Medicine | Admitting: Emergency Medicine

## 2022-09-23 DIAGNOSIS — J309 Allergic rhinitis, unspecified: Secondary | ICD-10-CM | POA: Diagnosis not present

## 2022-09-23 DIAGNOSIS — A084 Viral intestinal infection, unspecified: Secondary | ICD-10-CM | POA: Diagnosis not present

## 2022-09-23 MED ORDER — ONDANSETRON 8 MG PO TBDP: 8.0000 mg | ORAL_TABLET | Freq: Three times a day (TID) | ORAL | 0 refills | Status: DC | PRN

## 2022-09-23 MED ORDER — FLUTICASONE PROPIONATE 50 MCG/ACT NA SUSP: 1.0000 | Freq: Every day | NASAL | 2 refills | Status: DC

## 2022-09-23 MED ORDER — GUAIFENESIN ER 600 MG PO TB12: 1200.0000 mg | ORAL_TABLET | Freq: Two times a day (BID) | ORAL | 0 refills | Status: AC

## 2022-09-23 MED ORDER — LOPERAMIDE HCL 2 MG PO CAPS: 2.0000 mg | ORAL_CAPSULE | Freq: Four times a day (QID) | ORAL | 0 refills | Status: DC | PRN

## 2022-09-23 MED ORDER — ONDANSETRON 4 MG PO TBDP
ORAL_TABLET | ORAL | Status: AC
  Filled 2022-09-23: qty 1

## 2022-09-23 MED ORDER — ONDANSETRON 4 MG PO TBDP
4.0000 mg | ORAL_TABLET | Freq: Once | ORAL | Status: AC
  Administered 2022-09-23: 4 mg via ORAL

## 2022-09-23 NOTE — ED Provider Notes (Signed)
[facility]    CSN: [id number] Arrival date & time: 09/23/22  [id number]      History   Chief Complaint Chief Complaint  Patient presents with   Vomiting   Abdominal Pain   Sore Throat    HPI [name] is a 57 y.o. female.   Reports emesis, diarrhea, nasal congestion and headache since Tuesday morning. Her throat started to 'feel funny' on Sunday..   Last emesis around 9:45 pm last night, was what she ate earlier. Last diarrhea around 1am this morning. About 5 episodes of emesis total and has diarrhea everytime she eats something. Reports abdominal pain yesterday, nausea this morning when she woke up.   Denies recent sick contacts. Denies fevers, hold or cold chills.     The history is provided by the patient.  Abdominal Pain Associated symptoms: diarrhea, nausea, sore throat and vomiting   Associated symptoms: no chest pain, no chills, no cough, no dysuria, no fatigue, no fever and no shortness of breath   Sore Throat Associated symptoms include abdominal pain. Pertinent negatives include no chest pain, no headaches and no shortness of breath.    Past Medical History:  Diagnosis Date   Hypertension    Migraines     Patient Active Problem List   Diagnosis Date Noted   Prediabetes 07/06/2022   Anxiety and depression 03/29/2022   History of laparoscopic appendectomy 02/19/2021   Elevated serum hCG 02/09/2021   Situational anxiety 12/21/2019   Migraines 11/20/2019   Hypertension 11/20/2019   Cough 11/20/2019   Tachycardia 11/20/2019    Past Surgical History:  Procedure Laterality Date   ABDOMINAL HYSTERECTOMY     CHOLECYSTECTOMY     CYST EXCISION     LAPAROSCOPIC APPENDECTOMY N/A 02/19/2021   Procedure: APPENDECTOMY LAPAROSCOPIC;  Surgeon: Donnie Mesa, MD;  Location: [facility];  Service: General;  Laterality: N/A;   LAPAROSCOPY  02/19/2021   Procedure: LAPAROSCOPY DIAGNOSTIC;  Surgeon: Donnie Mesa, MD;  Location: [place];  Service: General;;    LYSIS OF ADHESION  02/19/2021   Procedure: LYSIS OF ADHESION;  Surgeon: Donnie Mesa, MD;  Location: [place];  Service: General;;    OB History   No obstetric history on file.      Home Medications    Prior to Admission medications   Medication Sig Start Date End Date Taking? Authorizing Provider  atenolol (TENORMIN) 100 MG tablet Take 1 tablet (100 mg total) by mouth daily. 05/04/22  Yes Alvira Monday, FNP  fluticasone (FLONASE) 50 MCG/ACT nasal spray Place 1 spray into both nostrils daily. 09/23/22  Yes Louretta Shorten, [place], FNP  guaiFENesin (MUCINEX) 600 MG 12 hr tablet Take 2 tablets (1,200 mg total) by mouth 2 (two) times daily for 5 days. 09/23/22 09/28/22 Yes Louretta Shorten, [place], FNP  hydrOXYzine (VISTARIL) 25 MG capsule Take 1 capsule (25 mg total) by mouth at bedtime as needed. 07/06/22  Yes Alvira Monday, FNP  loperamide (IMODIUM) 2 MG capsule Take 1 capsule (2 mg total) by mouth 4 (four) times daily as needed for diarrhea or loose stools. 09/23/22  Yes Louretta Shorten, [place], FNP  olmesartan (BENICAR) 40 MG tablet Take 1 tablet (40 mg total) by mouth daily. 07/20/22  Yes Lyndal Pulley, MD  ondansetron (ZOFRAN-ODT) 8 MG disintegrating tablet Take 1 tablet (8 mg total) by mouth every 8 (eight) hours as needed for nausea or vomiting. 09/23/22  Yes Louretta Shorten, [place], FNP  PARoxetine (PAXIL) 10 MG tablet Take 1 tablet by mouth  once daily 01/04/22  Yes Farrel Gordon, DO  SUMAtriptan (IMITREX) 100 MG tablet Take 1 tablet (100 mg total) by mouth every 2 (two) hours as needed for migraine. May repeat in 2 hours if headache persists or recurs. 08/17/22  Yes Alvira Monday, FNP  Vitamin D, Ergocalciferol, (DRISDOL) 1.25 MG (50000 UNIT) CAPS capsule Take 1 capsule (50,000 Units total) by mouth every 7 (seven) days. 07/12/22  Yes Alvira Monday, FNP  acetaminophen (TYLENOL) 500 MG tablet Take 1,000 mg by mouth every 6 (six) hours as needed for mild pain.    [provider]    Family  History Family History  Problem Relation Age of Onset   Hypertension Mother    Hypertension Father     Social History Social History   Tobacco Use   Smoking status: Never   Smokeless tobacco: Never  Vaping Use   Vaping Use: Never used  Substance Use Topics   Alcohol use: No   Drug use: No     Allergies   Codeine   Review of Systems Review of Systems  Constitutional:  Negative for chills, fatigue and fever.  HENT:  Positive for congestion, postnasal drip, rhinorrhea and sore throat. Negative for sinus pressure and sinus pain.   Eyes:  Negative for discharge and itching.  Respiratory:  Negative for cough and shortness of breath.   Cardiovascular:  Negative for chest pain.  Gastrointestinal:  Positive for abdominal pain, diarrhea, nausea and vomiting.  Genitourinary:  Negative for dysuria.  Musculoskeletal:  Negative for back pain and gait problem.  Neurological:  Negative for syncope and headaches.     Physical Exam Triage Vital Signs ED Triage Vitals [09/23/22 0828]  Enc Vitals Group     BP (!) 148/91     Pulse Rate 82     Resp 18     Temp 99.6 F (37.6 C)     Temp Source Oral     SpO2 98 %     Weight      Height      Head Circumference      Peak Flow      Pain Score      Pain Loc      Pain Edu?      Excl. in [place]?    No data found.  Updated Vital Signs BP (!) 148/91 (BP Location: Left Arm)   Pulse 82   Temp 99.6 F (37.6 C) (Oral)   Resp 18   SpO2 98%   Visual Acuity Right Eye Distance:   Left Eye Distance:   Bilateral Distance:    Right Eye Near:   Left Eye Near:    Bilateral Near:     Physical Exam Vitals and nursing note reviewed.  Constitutional:      General: She is not in acute distress.    Appearance: She is well-developed.  HENT:     Head: Normocephalic and atraumatic.  Eyes:     Conjunctiva/sclera: Conjunctivae normal.  Cardiovascular:     Rate and Rhythm: Normal rate and regular rhythm.     Heart sounds: Normal heart  sounds. No murmur heard. Pulmonary:     Effort: Pulmonary effort is normal. No respiratory distress.     Breath sounds: Normal breath sounds.  Abdominal:     General: Abdomen is flat. Bowel sounds are normal.     Palpations: Abdomen is soft.     Tenderness: There is no abdominal tenderness. There is no guarding or rebound. Negative signs include  Murphy's sign and Rovsing's sign.     Hernia: No hernia is present.  Musculoskeletal:        General: No swelling.     Cervical back: Neck supple.  Skin:    General: Skin is warm and dry.     Capillary Refill: Capillary refill takes less than 2 seconds.  Neurological:     Mental Status: She is alert and oriented to person, place, and time.  Psychiatric:        Mood and Affect: Mood normal.      UC Treatments / Results  Labs (all labs ordered are listed, but only abnormal results are displayed) Labs Reviewed - No data to display  EKG   Radiology No results found.  Procedures Procedures (including critical care time)  Medications Ordered in UC Medications  ondansetron (ZOFRAN-ODT) disintegrating tablet 4 mg (4 mg Oral Given 09/23/22 0855)    Initial Impression / Assessment and Plan / UC Course  I have reviewed the triage vital signs and the nursing notes.  Pertinent labs & imaging results that were available during my care of the patient were reviewed by me and considered in my medical decision making (see chart for details).  Vitals in triage reviewed, patient is hemodynamically stable.  Abdomen soft and nontender to palpation with active bowel sounds, low concern for acute abdomen.  Symptoms consistent with a viral gastroenteritis.  Symptomatic management and gradual diet progression discussed.  Nasal congestion, postnasal drip, and posterior pharynx with mild erythema consistent with allergic rhinitis.  Symptomatic management discussed.  Return and follow-up precautions reviewed, patient verbalized understanding, no questions at  this time.    Final Clinical Impressions(s) / UC Diagnoses   Final diagnoses:  Viral gastroenteritis  Allergic rhinitis, unspecified seasonality, unspecified trigger     Discharge Instructions      You appear to have a viral gastroenteritis.  Please do the gradual diet progression like we discussed.  You can start with water, ginger ale and an electrolyte solution like Pedialyte.  If you tolerate this, you can progress to broth and soup.  If this goes well, please progress to solids like white rice, toast, bananas, boiled chicken and other bland foods.   You can take the nausea medicine as needed as well as the Imodium for diarrhea.  Please use the Flonase and Mucinex combination to help with your nasal congestion and sore throat.  Please ensure you are drinking at least 64 ounces of water daily to stay hydrated.  For symptomatic relief of sore throat you can do warm saline gargles, tea with honey, sleep with a humidifier, and do over-the-counter cough drops.  Please return to clinic or your primary care if you do not have improvement in symptoms over the next 5 days, if you develop a high fever despite medication, if you are unable to keep food or fluids down despite medication, develop worsening abdominal pain, or any worsening of symptoms.      ED Prescriptions     Medication Sig Dispense Auth. Provider   ondansetron (ZOFRAN-ODT) 8 MG disintegrating tablet Take 1 tablet (8 mg total) by mouth every 8 (eight) hours as needed for nausea or vomiting. 20 tablet Louretta Shorten, [place], [place]   loperamide (IMODIUM) 2 MG capsule Take 1 capsule (2 mg total) by mouth 4 (four) times daily as needed for diarrhea or loose stools. 12 capsule Louretta Shorten, [place], [employer]   guaiFENesin (MUCINEX) 600 MG 12 hr tablet Take 2 tablets (1,200 mg total) by  mouth 2 (two) times daily for 5 days. 20 tablet Louretta Shorten, [place], FNP   fluticasone [facility]) 50 MCG/ACT nasal spray Place 1 spray into both nostrils daily.  9.9 mL Virna Livengood, [place], FNP      PDMP not reviewed this encounter.   Louretta Shorten [place], [place] 09/23/22 [phone]

## 2022-09-23 NOTE — ED Triage Notes (Signed)
Pt states diarrhea,vomiting and sore throat for several days. Pt states she has nasal congestion.

## 2022-09-23 NOTE — Discharge Instructions (Addendum)
You appear to have a viral gastroenteritis.  Please do the gradual diet progression like we discussed.  You can start with water, ginger ale and an electrolyte solution like Pedialyte.  If you tolerate this, you can progress to broth and soup.  If this goes well, please progress to solids like white rice, toast, bananas, boiled chicken and other bland foods.   You can take the nausea medicine as needed as well as the Imodium for diarrhea.  Please use the Flonase and Mucinex combination to help with your nasal congestion and sore throat.  Please ensure you are drinking at least 64 ounces of water daily to stay hydrated.  For symptomatic relief of sore throat you can do warm saline gargles, tea with honey, sleep with a humidifier, and do over-the-counter cough drops.  Please return to clinic or your primary care if you do not have improvement in symptoms over the next 5 days, if you develop a high fever despite medication, if you are unable to keep food or fluids down despite medication, develop worsening abdominal pain, or any worsening of symptoms.

## 2022-09-29 ENCOUNTER — Other Ambulatory Visit: Payer: Self-pay | Admitting: Family Medicine

## 2022-09-29 ENCOUNTER — Other Ambulatory Visit: Payer: Self-pay

## 2022-09-29 DIAGNOSIS — I10 Essential (primary) hypertension: Secondary | ICD-10-CM

## 2022-09-29 DIAGNOSIS — F418 Other specified anxiety disorders: Secondary | ICD-10-CM

## 2022-09-29 MED ORDER — OLMESARTAN MEDOXOMIL 40 MG PO TABS: 40.0000 mg | ORAL_TABLET | Freq: Every day | ORAL | 1 refills | Status: DC

## 2022-11-14 ENCOUNTER — Other Ambulatory Visit: Payer: Self-pay | Admitting: Family Medicine

## 2022-11-14 DIAGNOSIS — F418 Other specified anxiety disorders: Secondary | ICD-10-CM

## 2022-11-16 ENCOUNTER — Other Ambulatory Visit: Payer: Self-pay | Admitting: Family Medicine

## 2022-11-16 ENCOUNTER — Encounter: Payer: Self-pay | Admitting: Family Medicine

## 2022-11-16 ENCOUNTER — Ambulatory Visit: Payer: Managed Care, Other (non HMO) | Admitting: Family Medicine

## 2022-11-16 VITALS — BP 132/86 | Ht 66.0 in | Wt 134.1 lb

## 2022-11-16 DIAGNOSIS — E559 Vitamin D deficiency, unspecified: Secondary | ICD-10-CM

## 2022-11-16 DIAGNOSIS — R7301 Impaired fasting glucose: Secondary | ICD-10-CM

## 2022-11-16 DIAGNOSIS — G43109 Migraine with aura, not intractable, without status migrainosus: Secondary | ICD-10-CM

## 2022-11-16 DIAGNOSIS — Z1211 Encounter for screening for malignant neoplasm of colon: Secondary | ICD-10-CM

## 2022-11-16 DIAGNOSIS — G43019 Migraine without aura, intractable, without status migrainosus: Secondary | ICD-10-CM

## 2022-11-16 DIAGNOSIS — F419 Anxiety disorder, unspecified: Secondary | ICD-10-CM | POA: Diagnosis not present

## 2022-11-16 DIAGNOSIS — F32A Depression, unspecified: Secondary | ICD-10-CM

## 2022-11-16 DIAGNOSIS — I10 Essential (primary) hypertension: Secondary | ICD-10-CM

## 2022-11-16 DIAGNOSIS — E038 Other specified hypothyroidism: Secondary | ICD-10-CM

## 2022-11-16 DIAGNOSIS — E7849 Other hyperlipidemia: Secondary | ICD-10-CM

## 2022-11-16 DIAGNOSIS — N951 Menopausal and female climacteric states: Secondary | ICD-10-CM | POA: Diagnosis not present

## 2022-11-16 MED ORDER — SUMATRIPTAN SUCCINATE 100 MG PO TABS: 100.0000 mg | ORAL_TABLET | ORAL | 0 refills | Status: DC | PRN

## 2022-11-16 MED ORDER — RIBOFLAVIN 400 MG PO CAPS: 1.0000 | ORAL_CAPSULE | Freq: Every day | ORAL | 2 refills | Status: DC

## 2022-11-16 MED ORDER — VEOZAH 45 MG PO TABS: 45.0000 mg | ORAL_TABLET | Freq: Every day | ORAL | 2 refills | Status: DC

## 2022-11-16 NOTE — Assessment & Plan Note (Signed)
GAD-7 is 4 Takes hydroxyzine 25 mg at bedtime She denies suicidal ideation and homicidal ideation

## 2022-11-16 NOTE — Patient Instructions (Addendum)
   I appreciate the opportunity to provide care to you today!    Follow up:  1 month for pap smear  Labs: please stop by the lab today to get your blood drawn (CBC, CMP, TSH, Lipid profile, HgA1c, Vit D)  Hot flashes Please start taking veozah 45 mg daily   Migraines  Please start taking riboflavin 400 mg daily  Please schedule mammogram   Please continue to a heart-healthy diet and increase your physical activities. Try to exercise for at least five days a week.      It was a pleasure to see you and I look forward to continuing to work together on your health and well-being. Please do not hesitate to call the office if you need care or have questions about your care.   Have a wonderful day and week. With Gratitude, Gilmore Laroche MSN, FNP-BC

## 2022-11-16 NOTE — Assessment & Plan Note (Signed)
Reports having 3-4 migraines weekly Currently on sumatriptan 100 mg as needed for abortive treatment She reports relief of her symptoms when she take sumatriptan Will start patient on Riboflavin 400 milligrams capsule daily for prophylactic treatment

## 2022-11-16 NOTE — Progress Notes (Signed)
Established Patient Office Visit  Subjective:  Patient ID: [name], female    DOB: [birth year]  Age: 57 y.o. MRN: [id number]  CC:  Chief Complaint  Patient presents with   Chronic Care Management    3 month f/u, pt reports ongoing headaches has been using imitrex will pick up refill today, reports low energy.     HPI [name] is a 57 y.o. female with past medical history of hypertension, migraine headache and situational anxiety presents for f/u of  chronic medical conditions. For the details of today's visit, please refer to the assessment and plan.    Of note, the patient had a partial hysterectomy 19 years ago and is due for Pap smear examination.  Will schedule Pap smear examination in a month.   Past Medical History:  Diagnosis Date   Hypertension    Migraines     Past Surgical History:  Procedure Laterality Date   ABDOMINAL HYSTERECTOMY     CHOLECYSTECTOMY     CYST EXCISION     LAPAROSCOPIC APPENDECTOMY N/A 02/19/2021   Procedure: APPENDECTOMY LAPAROSCOPIC;  Surgeon: Manus Rudd, MD;  Location: [facility] OR;  Service: General;  Laterality: N/A;   LAPAROSCOPY  02/19/2021   Procedure: LAPAROSCOPY DIAGNOSTIC;  Surgeon: Manus Rudd, MD;  Location[facility] OR;  Service: General;;   LYSIS OF ADHESION  02/19/2021   Procedure: LYSIS OF ADHESION;  Surgeon: Manus Rudd, MD;  Location: [facility] OR;  Service: General;;    Family History  Problem Relation Age of Onset   Hypertension Mother    Hypertension Father     Social History   Socioeconomic History   Marital status: Legally Separated    Spouse name: Not on file   Number of children: Not on file   Years of education: Not on file   Highest education level: Not on file  Occupational History   Not on file  Tobacco Use   Smoking status: Never   Smokeless tobacco: Never  Vaping Use   Vaping Use: Never used  Substance and Sexual Activity   Alcohol use: No   Drug use: No   Sexual activity: Not on file   Other Topics Concern   Not on file  Social History Narrative   Not on file   Social Determinants of Health   Financial Resource Strain: Not on file  Food Insecurity: Not on file  Transportation Needs: Not on file  Physical Activity: Not on file  Stress: Not on file  Social Connections: Not on file  Intimate Partner Violence: Not on file    Outpatient Medications Prior to Visit  Medication Sig Dispense Refill   acetaminophen (TYLENOL) 500 MG tablet Take 1,000 mg by mouth every 6 (six) hours as needed for mild pain.     atenolol (TENORMIN) 100 MG tablet Take 1 tablet (100 mg total) by mouth daily. 90 tablet 1   fluticasone (FLONASE) 50 MCG/ACT nasal spray Place 1 spray into both nostrils daily. 9.9 mL 2   hydrOXYzine (VISTARIL) 25 MG capsule TAKE 1 CAPSULE BY MOUTH AT BEDTIME AS NEEDED 30 capsule 0   loperamide (IMODIUM) 2 MG capsule Take 1 capsule (2 mg total) by mouth 4 (four) times daily as needed for diarrhea or loose stools. 12 capsule 0   olmesartan (BENICAR) 40 MG tablet TAKE 1 TABLET BY MOUTH ONCE DAILY 90 tablet 0   ondansetron (ZOFRAN-ODT) 8 MG disintegrating tablet Take 1 tablet (8 mg total) by mouth every 8 (eight) hours as  needed for nausea or vomiting. 20 tablet 0   PARoxetine (PAXIL) 10 MG tablet Take 1 tablet by mouth once daily 30 tablet 0   SUMAtriptan (IMITREX) 100 MG tablet Take 1 tablet (100 mg total) by mouth every 2 (two) hours as needed for migraine. May repeat in 2 hours if headache persists or recurs. 10 tablet 0   Vitamin D, Ergocalciferol, (DRISDOL) 1.25 MG (50000 UNIT) CAPS capsule Take 1 capsule (50,000 Units total) by mouth every 7 (seven) days. 10 capsule 1   No facility-administered medications prior to visit.    Allergies  Allergen Reactions   Codeine Itching    ROS Review of Systems    Objective:    Physical Exam  BP 132/86   Ht 5\' 6"  (1.676 m)   Wt 134 lb 1.3 oz (60.8 kg)   BMI 21.64 kg/m  Wt Readings from Last 3 Encounters:   11/16/22 134 lb 1.3 oz (60.8 kg)  08/17/22 137 lb 1.3 oz (62.2 kg)  07/20/22 138 lb (62.6 kg)    Lab Results  Component Value Date   TSH 0.551 07/12/2022   Lab Results  Component Value Date   WBC 7.0 08/17/2022   HGB 12.0 08/17/2022   HCT 37.1 08/17/2022   MCV 91 08/17/2022   PLT 399 08/17/2022   Lab Results  Component Value Date   NA 143 07/20/2022   K 4.4 07/20/2022   CO2 22 07/20/2022   GLUCOSE 92 07/20/2022   BUN 14 07/20/2022   CREATININE 0.87 07/20/2022   BILITOT 0.5 07/12/2022   ALKPHOS 67 07/12/2022   AST 15 07/12/2022   ALT 9 07/12/2022   PROT 7.2 07/12/2022   ALBUMIN 4.2 07/12/2022   CALCIUM 9.4 07/20/2022   ANIONGAP 9 02/19/2021   EGFR 79 07/20/2022   Lab Results  Component Value Date   CHOL 188 07/12/2022   Lab Results  Component Value Date   HDL 61 07/12/2022   Lab Results  Component Value Date   LDLCALC 111 (H) 07/12/2022   Lab Results  Component Value Date   TRIG 89 07/12/2022   Lab Results  Component Value Date   CHOLHDL 3.1 07/12/2022   Lab Results  Component Value Date   HGBA1C 6.1 (H) 07/12/2022      Assessment & Plan:  Primary hypertension Assessment & Plan: Controlled She takes olmesartan 40 mg daily Patient is asymptomatic Low-sodium diet with increased physical activity encourage BP Readings from Last 3 Encounters:  11/16/22 132/86  09/23/22 (!) 148/91  08/17/22 136/77      Anxiety and depression Assessment & Plan: GAD-7 is 4 Takes hydroxyzine 25 mg at bedtime She denies suicidal ideation and homicidal ideation    Migraine with aura and without status migrainosus, not intractable Assessment & Plan: Reports having 3-4 migraines weekly Currently on sumatriptan 100 mg as needed for abortive treatment She reports relief of her symptoms when she take sumatriptan Will start patient on Riboflavin 400 milligrams capsule daily for prophylactic treatment     Orders: -     Riboflavin; Take 1 capsule (400 mg  total) by mouth daily.  Dispense: 30 capsule; Refill: 2  Vasomotor symptoms due to menopause -     [place]; Take 1 tablet (45 mg total) by mouth daily.  Dispense: 30 tablet; Refill: 2  Colon cancer screening -     Ambulatory referral to Gastroenterology  IFG (impaired fasting glucose) -     Hemoglobin A1c  Vitamin D deficiency -  VITAMIN D 25 Hydroxy (Vit-D Deficiency, Fractures)  Other specified hypothyroidism -     TSH + free T4  Other hyperlipidemia -     Lipid panel -     CMP14+EGFR -     CBC with Differential/Platelet    Follow-up: Return in about 1 month (around 12/17/2022).   Gilmore Laroche, FNP

## 2022-11-16 NOTE — Assessment & Plan Note (Signed)
Controlled She takes olmesartan 40 mg daily Patient is asymptomatic Low-sodium diet with increased physical activity encourage BP Readings from Last 3 Encounters:  11/16/22 132/86  09/23/22 (!) 148/91  08/17/22 136/77

## 2022-11-17 ENCOUNTER — Encounter (INDEPENDENT_AMBULATORY_CARE_PROVIDER_SITE_OTHER): Payer: Self-pay | Admitting: *Deleted

## 2022-11-24 ENCOUNTER — Other Ambulatory Visit: Payer: Self-pay | Admitting: Family Medicine

## 2022-11-24 DIAGNOSIS — E559 Vitamin D deficiency, unspecified: Secondary | ICD-10-CM

## 2022-11-24 LAB — CBC WITH DIFFERENTIAL/PLATELET
Basophils Absolute: 0.1 10*3/uL (ref 0.0–0.2)
Basos: 2 %
EOS (ABSOLUTE): 0.2 10*3/uL (ref 0.0–0.4)
Eos: 4 %
Hematocrit: 38.8 % (ref 34.0–46.6)
Hemoglobin: 12.2 g/dL (ref 11.1–15.9)
Immature Grans (Abs): 0 10*3/uL (ref 0.0–0.1)
Immature Granulocytes: 0 %
Lymphocytes Absolute: 2.3 10*3/uL (ref 0.7–3.1)
Lymphs: 43 %
MCH: 29.5 pg (ref 26.6–33.0)
MCHC: 31.4 g/dL — ABNORMAL LOW (ref 31.5–35.7)
MCV: 94 fL (ref 79–97)
Monocytes Absolute: 0.5 10*3/uL (ref 0.1–0.9)
Monocytes: 9 %
Neutrophils Absolute: 2.3 10*3/uL (ref 1.4–7.0)
Neutrophils: 42 %
Platelets: 364 10*3/uL (ref 150–450)
RBC: 4.14 x10E6/uL (ref 3.77–5.28)
RDW: 14.1 % (ref 11.7–15.4)
WBC: 5.4 10*3/uL (ref 3.4–10.8)

## 2022-11-24 LAB — CMP14+EGFR
ALT: 9 [IU]/L (ref 0–32)
AST: 15 [IU]/L (ref 0–40)
Albumin/Globulin Ratio: 1.3 (ref 1.2–2.2)
Albumin: 4.1 g/dL (ref 3.8–4.9)
Alkaline Phosphatase: 61 [IU]/L (ref 44–121)
BUN/Creatinine Ratio: 11 (ref 9–23)
BUN: 10 mg/dL (ref 6–24)
Bilirubin Total: 0.3 mg/dL (ref 0.0–1.2)
CO2: 23 mmol/L (ref 20–29)
Calcium: 9.8 mg/dL (ref 8.7–10.2)
Chloride: 106 mmol/L (ref 96–106)
Creatinine, Ser: 0.89 mg/dL (ref 0.57–1.00)
Globulin, Total: 3.1 g/dL (ref 1.5–4.5)
Glucose: 97 mg/dL (ref 70–99)
Potassium: 4.2 mmol/L (ref 3.5–5.2)
Sodium: 145 mmol/L — ABNORMAL HIGH (ref 134–144)
Total Protein: 7.2 g/dL (ref 6.0–8.5)
eGFR: 77 mL/min/{1.73_m2}

## 2022-11-24 LAB — VITAMIN D 25 HYDROXY (VIT D DEFICIENCY, FRACTURES): Vit D, 25-Hydroxy: 25.4 ng/mL — ABNORMAL LOW (ref 30.0–100.0)

## 2022-11-24 LAB — LIPID PANEL
Chol/HDL Ratio: 4.1 ratio (ref 0.0–4.4)
Cholesterol, Total: 176 mg/dL (ref 100–199)
HDL: 43 mg/dL (ref >39)
LDL Chol Calc (NIH): 96 mg/dL (ref 0–99)
Triglycerides: 216 mg/dL — ABNORMAL HIGH (ref 0–149)
VLDL Cholesterol Cal: 37 mg/dL (ref 5–40)

## 2022-11-24 LAB — TSH+FREE T4
Free T4: 1.16 ng/dL (ref 0.82–1.77)
TSH: 0.621 u[IU]/mL (ref 0.450–4.500)

## 2022-11-24 LAB — HEMOGLOBIN A1C
Est. average glucose Bld gHb Est-mCnc: 128 mg/dL
Hgb A1c MFr Bld: 6.1 % — ABNORMAL HIGH (ref 4.8–5.6)

## 2022-11-24 MED ORDER — VITAMIN D (ERGOCALCIFEROL) 1.25 MG (50000 UNIT) PO CAPS: 50000.0000 [IU] | ORAL_CAPSULE | ORAL | 1 refills | Status: DC

## 2022-12-12 ENCOUNTER — Other Ambulatory Visit: Payer: Self-pay | Admitting: Family Medicine

## 2022-12-12 DIAGNOSIS — F418 Other specified anxiety disorders: Secondary | ICD-10-CM

## 2022-12-20 ENCOUNTER — Ambulatory Visit: Payer: Managed Care, Other (non HMO) | Admitting: Family Medicine

## 2022-12-22 ENCOUNTER — Emergency Department (HOSPITAL_COMMUNITY)
Admission: EM | Admit: 2022-12-22 | Discharge: 2022-12-22 | Disposition: A | Discharge status: Home / Self Care | Payer: Managed Care, Other (non HMO) | Attending: Emergency Medicine | Admitting: Emergency Medicine

## 2022-12-22 ENCOUNTER — Encounter (HOSPITAL_COMMUNITY): Payer: Self-pay

## 2022-12-22 ENCOUNTER — Other Ambulatory Visit: Payer: Self-pay

## 2022-12-22 ENCOUNTER — Emergency Department (HOSPITAL_COMMUNITY): Payer: Managed Care, Other (non HMO)

## 2022-12-22 DIAGNOSIS — R7309 Other abnormal glucose: Secondary | ICD-10-CM | POA: Diagnosis not present

## 2022-12-22 DIAGNOSIS — I10 Essential (primary) hypertension: Secondary | ICD-10-CM | POA: Insufficient documentation

## 2022-12-22 DIAGNOSIS — R519 Headache, unspecified: Secondary | ICD-10-CM | POA: Diagnosis present

## 2022-12-22 DIAGNOSIS — I159 Secondary hypertension, unspecified: Secondary | ICD-10-CM | POA: Insufficient documentation

## 2022-12-22 HISTORY — DX: Prediabetes: R73.03

## 2022-12-22 LAB — CBG MONITORING, ED: Glucose-Capillary: 69 mg/dL — ABNORMAL LOW (ref 70–99)

## 2022-12-22 LAB — BASIC METABOLIC PANEL
Anion gap: 8 (ref 5–15)
BUN: 14 mg/dL (ref 6–20)
CO2: 25 mmol/L (ref 22–32)
Calcium: 9 mg/dL (ref 8.9–10.3)
Chloride: 106 mmol/L (ref 98–111)
Creatinine, Ser: 0.94 mg/dL (ref 0.44–1.00)
GFR, Estimated: >60 mL/min (ref >60)
Glucose, Bld: 94 mg/dL (ref 70–99)
Potassium: 3.7 mmol/L (ref 3.5–5.1)
Sodium: 139 mmol/L (ref 135–145)

## 2022-12-22 LAB — URINALYSIS, ROUTINE W REFLEX MICROSCOPIC
Bilirubin Urine: NEGATIVE
Glucose, UA: NEGATIVE mg/dL
Ketones, ur: NEGATIVE mg/dL
Nitrite: POSITIVE — AB
Protein, ur: NEGATIVE mg/dL
Specific Gravity, Urine: 1.006 (ref 1.005–1.030)
pH: 6 (ref 5.0–8.0)

## 2022-12-22 LAB — CBC
HCT: 36.9 % (ref 36.0–46.0)
Hemoglobin: 11.5 g/dL — ABNORMAL LOW (ref 12.0–15.0)
MCH: 29.1 pg (ref 26.0–34.0)
MCHC: 31.2 g/dL (ref 30.0–36.0)
MCV: 93.4 fL (ref 80.0–100.0)
Platelets: 326 10*3/uL (ref 150–400)
RBC: 3.95 MIL/uL (ref 3.87–5.11)
RDW: 13.6 % (ref 11.5–15.5)
WBC: 8.2 10*3/uL (ref 4.0–10.5)
nRBC: 0 % (ref 0.0–0.2)

## 2022-12-22 MED ORDER — SODIUM CHLORIDE 0.9 % IV BOLUS
500.0000 mL | Freq: Once | INTRAVENOUS | Status: AC
  Administered 2022-12-22: 500 mL via INTRAVENOUS

## 2022-12-22 MED ORDER — KETOROLAC TROMETHAMINE 30 MG/ML IJ SOLN
15.0000 mg | Freq: Once | INTRAMUSCULAR | Status: AC
  Administered 2022-12-22: 15 mg via INTRAVENOUS
  Filled 2022-12-22: qty 1

## 2022-12-22 MED ORDER — HYDRALAZINE HCL 10 MG PO TABS: 10.0000 mg | ORAL_TABLET | Freq: Once | ORAL | 0 refills | Status: DC

## 2022-12-22 MED ORDER — HYDRALAZINE HCL 20 MG/ML IJ SOLN
5.0000 mg | Freq: Once | INTRAMUSCULAR | Status: AC
  Administered 2022-12-22: 5 mg via INTRAVENOUS
  Filled 2022-12-22: qty 1

## 2022-12-22 NOTE — ED Provider Notes (Signed)
[facility] EMERGENCY DEPARTMENT AT [facility] Provider Note   CSN: [id number] Arrival date & time: 12/22/22  1412     History  Chief Complaint  Patient presents with   Headache   Dizziness    [name] is a 57 y.o. female.  HPI   57 year old female with past medical history of HTN and chronic headaches presents emergency department with high blood pressure and headache.  Patient states that she when she was working today at [employer] she became overheated and hot.  When they took her to the office they noticed that her blood pressure was over 200 systolic and EMS was called.  Blood pressure was elevated with EMS but no medication was given.  She is complaining of a mild generalized headache for the past week.  She gets these headaches frequently, about 2 times a month.  Has not had any recent evaluation by neurology.  No standing diagnosis of migraine.  The headache was not acute, no associated neck pain or stiffness.  No associated neurosymptoms.  No active chest pain, shortness of breath, swelling of her lower extremities or new symptoms.  Home Medications Prior to Admission medications   Medication Sig Start Date End Date Taking? Authorizing Provider  hydrALAZINE (APRESOLINE) 10 MG tablet Take 1 tablet (10 mg total) by mouth once for 1 dose. Take one tablet by mouth if blood pressure higher than 190 on the top number. Only take one dose in 24 hours. 12/22/22 12/22/22 Yes Cyrene Gharibian, Clabe Seal, DO  acetaminophen (TYLENOL) 500 MG tablet Take 1,000 mg by mouth every 6 (six) hours as needed for mild pain.    [provider]  atenolol (TENORMIN) 100 MG tablet Take 1 tablet (100 mg total) by mouth daily. 05/04/22   Gilmore Laroche, FNP  Fezolinetant (VEOZAH) 45 MG TABS Take 1 tablet (45 mg total) by mouth daily. 11/16/22   Gilmore Laroche, FNP  fluticasone (FLONASE) 50 MCG/ACT nasal spray Place 1 spray into both nostrils daily. 09/23/22   Garrison, [place] N, FNP   hydrOXYzine (VISTARIL) 25 MG capsule TAKE 1 CAPSULE BY MOUTH AT BEDTIME AS NEEDED 12/13/22   Gilmore Laroche, FNP  loperamide (IMODIUM) 2 MG capsule Take 1 capsule (2 mg total) by mouth 4 (four) times daily as needed for diarrhea or loose stools. 09/23/22   Garrison, [place] N, FNP  olmesartan (BENICAR) 40 MG tablet TAKE 1 TABLET BY MOUTH ONCE DAILY 09/29/22   Gilmore Laroche, FNP  ondansetron (ZOFRAN-ODT) 8 MG disintegrating tablet Take 1 tablet (8 mg total) by mouth every 8 (eight) hours as needed for nausea or vomiting. 09/23/22   Garrison, [place] N, FNP  PARoxetine (PAXIL) 10 MG tablet Take 1 tablet by mouth once daily 01/04/22   Champ Mungo, DO  Riboflavin 400 MG CAPS Take 1 capsule (400 mg total) by mouth daily. 11/16/22   Gilmore Laroche, FNP  SUMAtriptan (IMITREX) 100 MG tablet Take 1 tablet (100 mg total) by mouth every 2 (two) hours as needed for migraine. May repeat in 2 hours if headache persists or recurs. 11/16/22   Gilmore Laroche, FNP  Vitamin D, Ergocalciferol, (DRISDOL) 1.25 MG (50000 UNIT) CAPS capsule Take 1 capsule (50,000 Units total) by mouth every 7 (seven) days. 11/24/22   Gilmore Laroche, FNP      Allergies    Codeine    Review of Systems   Review of Systems  Constitutional:  Positive for fatigue. Negative for fever.  Respiratory:  Negative for shortness of breath.  Cardiovascular:  Negative for chest pain.  Gastrointestinal:  Negative for abdominal pain, diarrhea and vomiting.  Skin:  Negative for rash.  Neurological:  Positive for headaches. Negative for syncope, facial asymmetry, weakness and numbness.    Physical Exam Updated Vital Signs BP (!) 163/98   Pulse 77   Temp 98.2 F (36.8 C) (Oral)   Resp 16   Ht 5\' 6"  (1.676 m)   Wt 59 kg   SpO2 100%   BMI 20.98 kg/m  Physical Exam Vitals and nursing note reviewed.  Constitutional:      General: She is not in acute distress.    Appearance: Normal appearance. She is not ill-appearing.  HENT:     Head:  Normocephalic.     Mouth/Throat:     Mouth: Mucous membranes are moist.  Eyes:     Extraocular Movements: Extraocular movements intact.     Pupils: Pupils are equal, round, and reactive to light.  Cardiovascular:     Rate and Rhythm: Normal rate.  Pulmonary:     Effort: Pulmonary effort is normal. No respiratory distress.  Abdominal:     Palpations: Abdomen is soft.     Tenderness: There is no abdominal tenderness.  Musculoskeletal:     Cervical back: Normal range of motion and neck supple.  Skin:    General: Skin is warm.  Neurological:     Mental Status: She is alert and oriented to person, place, and time. Mental status is at baseline.  Psychiatric:        Mood and Affect: Mood normal.     ED Results / Procedures / Treatments   Labs (all labs ordered are listed, but only abnormal results are displayed) Labs Reviewed  CBC - Abnormal; Notable for the following components:      Result Value   Hemoglobin 11.5 (*)    All other components within normal limits  URINALYSIS, ROUTINE W REFLEX MICROSCOPIC - Abnormal; Notable for the following components:   APPearance HAZY (*)    Hgb urine dipstick SMALL (*)    Nitrite POSITIVE (*)    Leukocytes,Ua TRACE (*)    Bacteria, UA RARE (*)    All other components within normal limits  CBG MONITORING, ED - Abnormal; Notable for the following components:   Glucose-Capillary 69 (*)    All other components within normal limits  BASIC METABOLIC PANEL    EKG None  Radiology CT Head Wo Contrast  Result Date: 12/22/2022 CLINICAL DATA:  Headaches EXAM: CT HEAD WITHOUT CONTRAST TECHNIQUE: Contiguous axial images were obtained from the base of the skull through the vertex without intravenous contrast. RADIATION DOSE REDUCTION: This exam was performed according to the departmental dose-optimization program which includes automated exposure control, adjustment of the mA and/or kV according to patient size and/or use of iterative reconstruction  technique. COMPARISON:  09/12/2007 FINDINGS: Brain: No acute intracranial findings are seen. There are no signs of bleeding within the cranium. Ventricles are not dilated. There is no focal edema or mass effect. Vascular: Unremarkable. Skull: No acute findings are seen. Sinuses/Orbits: Mucous retention cyst is seen in right maxillary sinus. Other: None. IMPRESSION: No acute intracranial findings are seen in noncontrast CT brain. Chronic right maxillary sinusitis. Electronically Signed   By: Ernie Avena M.D.   On: 12/22/2022 16:11    Procedures Procedures    Medications Ordered in ED Medications  hydrALAZINE (APRESOLINE) injection 5 mg (5 mg Intravenous Given 12/22/22 1603)  sodium chloride 0.9 % bolus 500 mL (500  mLs Intravenous New Bag/Given 12/22/22 1605)  ketorolac (TORADOL) 30 MG/ML injection 15 mg (15 mg Intravenous Given 12/22/22 1639)    ED Course/ Medical Decision Making/ A&P                             Medical Decision Making Amount and/or Complexity of Data Reviewed Labs: ordered. Radiology: ordered.  Risk Prescription drug management.   57 year old female presents emergency department with ongoing mild headache as well as hypertension.  Patient has history of frequent chronic headaches that last about a week.  No diagnosis of migraine or current follow-up with neurology.  Patient states that she became overheated while working in [employer].  She denied any syncopal symptoms, and no chest pain/shortness of breath/dizziness.  She is otherwise been in her usual state of health.  EKG shows no acute findings.  CT of the head is unremarkable.  She was hypertensive on arrival, given dose of hydralazine and hydrated.  On reevaluation headache is improved, blood pressure is downtrending.  Will plan for outpatient follow-up with primary doctor and encouraged establishing care with neurology for chronic headaches.  Do not appreciate any other acute aspect of this headache which  would warrant further emergent imaging.  No associated neurosymptoms.  Patient at this time appears safe and stable for discharge and close outpatient follow up. Discharge plan and strict return to ED precautions discussed, patient verbalizes understanding and agreement.        Final Clinical Impression(s) / ED Diagnoses Final diagnoses:  Secondary hypertension  Nonintractable headache, unspecified chronicity pattern, unspecified headache type    Rx / DC Orders ED Discharge Orders          Ordered    hydrALAZINE (APRESOLINE) 10 MG tablet   Once        12/22/22 1658              Brynja Marker, Clabe Seal, DO 12/22/22 1703

## 2022-12-22 NOTE — ED Notes (Signed)
Patient transported to CT 

## 2022-12-22 NOTE — ED Triage Notes (Signed)
Pt bib EMS from work w/ c/o dizziness and headache.  Patient took blood pressure at job, reportedly 200/100-ish.EMS initial bp was 192/100. Patient denies cp, n/v, and SOB.     EMS reported VS: 156/93, 77, 100%, 103 CBG. Denies cp, n/v, SOB. 2 20g in left forearm

## 2022-12-22 NOTE — Discharge Instructions (Signed)
You have been seen and discharged from the emergency department.  Continue to take your home medications as prescribed.  If your blood pressure gets over 190 on the top number you may take 1 dose of the medication provided.  Follow-up with your primary provider for further evaluation and further care. Take home medications as prescribed. If you have any worsening symptoms or further concerns for your health please return to an emergency department for further evaluation.

## 2022-12-26 ENCOUNTER — Other Ambulatory Visit: Payer: Self-pay | Admitting: Family Medicine

## 2022-12-26 DIAGNOSIS — I10 Essential (primary) hypertension: Secondary | ICD-10-CM

## 2023-01-01 ENCOUNTER — Other Ambulatory Visit: Payer: Self-pay | Admitting: Family Medicine

## 2023-01-01 DIAGNOSIS — G43019 Migraine without aura, intractable, without status migrainosus: Secondary | ICD-10-CM

## 2023-01-01 DIAGNOSIS — F418 Other specified anxiety disorders: Secondary | ICD-10-CM

## 2023-01-03 MED ORDER — SUMATRIPTAN SUCCINATE 100 MG PO TABS
100.0000 mg | ORAL_TABLET | ORAL | 0 refills | Status: DC | PRN
Start: 2023-01-03 — End: 2023-09-12

## 2023-01-05 ENCOUNTER — Telehealth: Payer: Self-pay | Admitting: Family Medicine

## 2023-01-05 ENCOUNTER — Ambulatory Visit: Payer: Managed Care, Other (non HMO) | Admitting: Family Medicine

## 2023-01-05 ENCOUNTER — Encounter: Payer: Self-pay | Admitting: Family Medicine

## 2023-01-05 VITALS — BP 138/86 | HR 107 | Ht 66.0 in | Wt 133.1 lb

## 2023-01-05 DIAGNOSIS — R519 Headache, unspecified: Secondary | ICD-10-CM | POA: Diagnosis not present

## 2023-01-05 NOTE — Progress Notes (Addendum)
Established Patient Office Visit  Subjective:  Patient ID: [name], female    DOB: 1965-10-05  Age: 57 y.o. MRN: [id number]  CC:  Chief Complaint  Patient presents with   Hypertension    Pt reports frequent headaches, reports an episode of severe headache at work bp sky high went in to ED on 07/03 since then she has been still having headaches.    forms    Has forms to have completed due to being out of work some days, they gave her 14 days to have them turned in, they can be faxed she would also like a copy when they are completed.     HPI [name] is a 57 y.o. female with past medical history of hypertension, headache presents for f/u of  chronic medical conditions. The patient was seen in the ED on 01/18/2023 for elevated BPs in the 200s systolic with severe headache.  She reports getting frequent headaches 2 times monthly.  No red flag symptoms to the headache reported.  She was treated with hydralazine and hydration and discharged home on hydralazine 10 mg to take for BPs greater than 190.  Today in the clinic her blood pressure is stable with complaints of a mild headache.  No vision disturbances, stiffness in the leg, loss of mentation noted.     Wt Readings from Last 3 Encounters:  01/05/23 133 lb 1.3 oz (60.4 kg)  12/22/22 130 lb (59 kg)  11/16/22 134 lb 1.3 oz (60.8 kg)     Past Medical History:  Diagnosis Date   Hypertension    Migraines    Prediabetes     Past Surgical History:  Procedure Laterality Date   ABDOMINAL HYSTERECTOMY     CHOLECYSTECTOMY     CYST EXCISION     LAPAROSCOPIC APPENDECTOMY N/A 02/19/2021   Procedure: APPENDECTOMY LAPAROSCOPIC;  Surgeon: Manus Rudd, MD;  Location: [facility] OR;  Service: General;  Laterality: N/A;   LAPAROSCOPY  02/19/2021   Procedure: LAPAROSCOPY DIAGNOSTIC;  Surgeon: Manus Rudd, MD;  Location: [facility] OR;  Service: General;;   LYSIS OF ADHESION  02/19/2021   Procedure: LYSIS OF ADHESION;  Surgeon: Manus Rudd, MD;  Location: [facility] OR;  Service: General;;    Family History  Problem Relation Age of Onset   Hypertension Mother    Hypertension Father     Social History   Socioeconomic History   Marital status: Legally Separated    Spouse name: Not on file   Number of children: Not on file   Years of education: Not on file   Highest education level: 12th grade  Occupational History   Not on file  Tobacco Use   Smoking status: Never   Smokeless tobacco: Never  Vaping Use   Vaping status: Never Used  Substance and Sexual Activity   Alcohol use: No   Drug use: No   Sexual activity: Not on file  Other Topics Concern   Not on file  Social History Narrative   Not on file   Social Determinants of Health   Financial Resource Strain: Low Risk  (01/02/2023)   Overall Financial Resource Strain (CARDIA)    Difficulty of Paying Living Expenses: Not hard at all  Food Insecurity: No Food Insecurity (01/02/2023)   Hunger Vital Sign    Worried About Running Out of Food in the Last Year: Never true    Ran Out of Food in the Last Year: Never true  Transportation Needs: No Transportation Needs (  01/02/2023)   PRAPARE - Administrator, Civil Service (Medical): No    Lack of Transportation (Non-Medical): No  Physical Activity: Sufficiently Active (01/02/2023)   Exercise Vital Sign    Days of Exercise per Week: 6 days    Minutes of Exercise per Session: 130 min  Stress: Stress Concern Present (01/02/2023)   [employer] of Occupational Health - Occupational Stress Questionnaire    Feeling of Stress : Very much  Social Connections: Unknown (01/02/2023)   Social Connection and Isolation Panel [NHANES]    Frequency of Communication with Friends and Family: Three times a week    Frequency of Social Gatherings with Friends and Family: Patient declined    Attends Religious Services: Patient declined    Database administrator or Organizations: No    Attends Hospital doctor: Not on file    Marital Status: Separated  Intimate Partner Violence: Unknown (09/24/2021)   Received from [facility]   HITS    Physically Hurt: Not on file    Insult or Talk Down To: Not on file    Threaten Physical Harm: Not on file    Scream or Curse: Not on file    Outpatient Medications Prior to Visit  Medication Sig Dispense Refill   acetaminophen (TYLENOL) 500 MG tablet Take 1,000 mg by mouth every 6 (six) hours as needed for mild pain.     atenolol (TENORMIN) 100 MG tablet Take 1 tablet by mouth once daily 90 tablet 0   Fezolinetant (VEOZAH) 45 MG TABS Take 1 tablet (45 mg total) by mouth daily. 30 tablet 2   fluticasone (FLONASE) 50 MCG/ACT nasal spray Place 1 spray into both nostrils daily. 9.9 mL 2   hydrALAZINE (APRESOLINE) 10 MG tablet Take 1 tablet (10 mg total) by mouth once for 1 dose. Take one tablet by mouth if blood pressure higher than 190 on the top number. Only take one dose in 24 hours. 5 tablet 0   loperamide (IMODIUM) 2 MG capsule Take 1 capsule (2 mg total) by mouth 4 (four) times daily as needed for diarrhea or loose stools. 12 capsule 0   olmesartan (BENICAR) 40 MG tablet Take 1 tablet by mouth once daily 90 tablet 0   ondansetron (ZOFRAN-ODT) 8 MG disintegrating tablet Take 1 tablet (8 mg total) by mouth every 8 (eight) hours as needed for nausea or vomiting. 20 tablet 0   PARoxetine (PAXIL) 10 MG tablet Take 1 tablet by mouth once daily 30 tablet 0   Riboflavin 400 MG CAPS Take 1 capsule (400 mg total) by mouth daily. 30 capsule 2   SUMAtriptan (IMITREX) 100 MG tablet Take 1 tablet (100 mg total) by mouth every 2 (two) hours as needed for migraine. May repeat in 2 hours if headache persists or recurs. 10 tablet 0   Vitamin D, Ergocalciferol, (DRISDOL) 1.25 MG (50000 UNIT) CAPS capsule Take 1 capsule (50,000 Units total) by mouth every 7 (seven) days. 20 capsule 1   hydrOXYzine (VISTARIL) 25 MG capsule TAKE 1 CAPSULE BY MOUTH AT BEDTIME AS NEEDED 30  capsule 0   No facility-administered medications prior to visit.    Allergies  Allergen Reactions   Codeine Itching    ROS Review of Systems    Objective:    Physical Exam HENT:     Head: Normocephalic.     Mouth/Throat:     Mouth: Mucous membranes are moist.  Cardiovascular:     Rate and Rhythm: Normal  rate.     Heart sounds: Normal heart sounds.  Pulmonary:     Effort: Pulmonary effort is normal.     Breath sounds: Normal breath sounds.  Neurological:     Mental Status: She is alert.     BP 138/86   Pulse (!) 107   Ht 5\' 6"  (1.676 m)   Wt 133 lb 1.3 oz (60.4 kg)   SpO2 99%   BMI 21.48 kg/m  Wt Readings from Last 3 Encounters:  01/05/23 133 lb 1.3 oz (60.4 kg)  12/22/22 130 lb (59 kg)  11/16/22 134 lb 1.3 oz (60.8 kg)    Lab Results  Component Value Date   TSH 0.621 11/23/2022   Lab Results  Component Value Date   WBC 8.2 12/22/2022   HGB 11.5 (L) 12/22/2022   HCT 36.9 12/22/2022   MCV 93.4 12/22/2022   PLT 326 12/22/2022   Lab Results  Component Value Date   NA 139 12/22/2022   K 3.7 12/22/2022   CO2 25 12/22/2022   GLUCOSE 94 12/22/2022   BUN 14 12/22/2022   CREATININE 0.94 12/22/2022   BILITOT 0.3 11/23/2022   ALKPHOS 61 11/23/2022   AST 15 11/23/2022   ALT 9 11/23/2022   PROT 7.2 11/23/2022   ALBUMIN 4.1 11/23/2022   CALCIUM 9.0 12/22/2022   ANIONGAP 8 12/22/2022   EGFR 77 11/23/2022   Lab Results  Component Value Date   CHOL 176 11/23/2022   Lab Results  Component Value Date   HDL 43 11/23/2022   Lab Results  Component Value Date   LDLCALC 96 11/23/2022   Lab Results  Component Value Date   TRIG 216 (H) 11/23/2022   Lab Results  Component Value Date   CHOLHDL 4.1 11/23/2022   Lab Results  Component Value Date   HGBA1C 6.1 (H) 11/23/2022      Assessment & Plan:  Nonintractable episodic headache, unspecified headache type Assessment & Plan: She reports adequate fluid intake She is compliant on current  treatment regiment for her blood pressure She does have a history of migraines for which she takes sumatriptan 100mg  as needed for abortive treatment and Riboflavin 400 mg for prophylactic treatment She reports constantly having a mild headache that does not go away No red flag symptoms reported Given her history of migraines, will refer the patient to neurology for evaluation of her chronic headaches Encouraged to check her blood pressure when her headaches becomes intolerable as elevated BP can cause severe headaches   Orders: -     Ambulatory referral to Neurology  Note: This chart has been completed using Engineer, civil (consulting) software, and while attempts have been made to ensure accuracy, certain words and phrases may not be transcribed as intended.    Follow-up: Return in about 3 months (around 04/07/2023).   Gilmore Laroche, FNP

## 2023-01-05 NOTE — Patient Instructions (Addendum)
I appreciate the opportunity to provide care to you today!    Follow up:  3 months  -continue current treatment regimen -check your BP daily  -BP goal is <140/90 -Report to the ED for BP>180/120 on two occasions -Increase your daily fluid intake at least 64 ounces daily  Please report to the ED  if having any of these symptoms with your headaches Acute onset, severe headache described as the worst headache of my life in a patient who has no history of headache Unrelenting headaches, or unrelieved with conservative treatments or with pain that steadily worsens Lancinating, "ice-pick" head pain Severe headaches associated with a stiff neck and or fever Headache accompanied by her change in mentation or level of consciousness Persistent headache following trauma to the head or neck Headaches are significantly different in pattern or severity in the patient with a longstanding chronic headache history   Referrals today-  Neurology  Attached with your AVS, you will find valuable resources for self-education. I highly recommend dedicating some time to thoroughly examine them.   Please continue to a heart-healthy diet and increase your physical activities. Try to exercise for at least five days a week.    It was a pleasure to see you and I look forward to continuing to work together on your health and well-being. Please do not hesitate to call the office if you need care or have questions about your care.  In case of emergency, please visit the Emergency Department for urgent care, or contact our clinic at [phone] to schedule an appointment. We're here to help you!   Have a wonderful day and week. With Gratitude, Gilmore Laroche MSN, FNP-BC

## 2023-01-05 NOTE — Telephone Encounter (Signed)
FMLA  Copied Noted Sleeved (put forms in provider box and copy in copy folder at front desk)  Fax forms to [employer] [phone] and call patient when ready.

## 2023-01-07 DIAGNOSIS — R519 Headache, unspecified: Secondary | ICD-10-CM | POA: Insufficient documentation

## 2023-01-07 NOTE — Assessment & Plan Note (Addendum)
She reports adequate fluid intake She is compliant on current treatment regiment for her blood pressure She does have a history of migraines for which she takes sumatriptan 100mg  as needed for abortive treatment and Riboflavin 400 mg for prophylactic treatment She reports constantly having a mild headache that does not go away No red flag symptoms reported Given her history of migraines, will refer the patient to neurology for evaluation of her chronic headaches Encouraged to check her blood pressure when her headaches becomes intolerable as elevated BP can cause severe headaches

## 2023-01-08 ENCOUNTER — Other Ambulatory Visit: Payer: Self-pay | Admitting: Family Medicine

## 2023-01-08 DIAGNOSIS — F418 Other specified anxiety disorders: Secondary | ICD-10-CM

## 2023-01-10 ENCOUNTER — Encounter: Payer: Self-pay | Admitting: Neurology

## 2023-01-14 NOTE — Telephone Encounter (Signed)
Called in to  check status of forms. Only has 14 day to turn in forms.  Wants a cll back

## 2023-01-17 NOTE — Telephone Encounter (Signed)
Will have it completed by the end of the week

## 2023-01-17 NOTE — Telephone Encounter (Signed)
Left vm letting pt know we will call her when forms are ready by the end of the week.

## 2023-01-19 NOTE — Telephone Encounter (Signed)
Called pt LVM forms ready for pick up Faxed w. Conf[employer]ation 01/18/23  Forms in completed folder front desk for pick up

## 2023-01-24 ENCOUNTER — Encounter (HOSPITAL_COMMUNITY): Payer: Self-pay

## 2023-01-24 ENCOUNTER — Ambulatory Visit (HOSPITAL_COMMUNITY)
Admission: EM | Admit: 2023-01-24 | Discharge: 2023-01-24 | Disposition: A | Discharge status: Home / Self Care | Payer: Managed Care, Other (non HMO) | Attending: Physician Assistant | Admitting: Physician Assistant

## 2023-01-24 DIAGNOSIS — G43009 Migraine without aura, not intractable, without status migrainosus: Secondary | ICD-10-CM | POA: Diagnosis not present

## 2023-01-24 MED ORDER — DEXAMETHASONE SODIUM PHOSPHATE 10 MG/ML IJ SOLN
INTRAMUSCULAR | Status: AC
  Filled 2023-01-24: qty 1

## 2023-01-24 MED ORDER — DEXAMETHASONE SODIUM PHOSPHATE 10 MG/ML IJ SOLN
10.0000 mg | Freq: Once | INTRAMUSCULAR | Status: AC
  Administered 2023-01-24: 10 mg via INTRAMUSCULAR

## 2023-01-24 MED ORDER — BACLOFEN 10 MG PO TABS: 10.0000 mg | ORAL_TABLET | Freq: Two times a day (BID) | ORAL | 0 refills | Status: DC | PRN

## 2023-01-24 MED ORDER — KETOROLAC TROMETHAMINE 30 MG/ML IJ SOLN
30.0000 mg | Freq: Once | INTRAMUSCULAR | Status: AC
  Administered 2023-01-24: 30 mg via INTRAMUSCULAR

## 2023-01-24 MED ORDER — KETOROLAC TROMETHAMINE 30 MG/ML IJ SOLN
INTRAMUSCULAR | Status: AC
  Filled 2023-01-24: qty 1

## 2023-01-24 NOTE — Discharge Instructions (Signed)
I am glad that you are feeling better after the medication.  Do not take NSAIDs (aspirin, ibuprofen/Advil, naproxen/Aleve) for 24 hours.  You can use acetaminophen/Tylenol for breakthrough pain.  Take baclofen up to twice a day.  This will make you sleepy so do not drive drink alcohol with taking it.  Make sure that you rest and drink plenty of fluid.  If you have any worsening symptoms including the worst headache of your life, sudden severe headache, nausea/vomiting, weakness, vision change, fever you need to be seen immediately.

## 2023-01-24 NOTE — ED Triage Notes (Signed)
Patient here today with c/o HA, nausea, and light sensitivity since Friday. Patient has a h/o Migraines and takes Imitrex and Goody powders but they have not helped this time.

## 2023-01-24 NOTE — ED Provider Notes (Signed)
[facility]    CSN: [id number] Arrival date & time: 01/24/23  1742      History   Chief Complaint Chief Complaint  Patient presents with   Headache    HPI [name] is a 57 y.o. female.   Patient presents today with a 3-day history of headache.  She reports that pain is rated 6 on a 0-10 pain scale, localized to frontal/bitemporal region, described as throbbing, worse with activity, no alleviating factors identified.  She has tried previously prescribed Imitrex as well as Goody powders without improvement of symptoms.  She does have a history of migraines and states current symptoms are similar to previous episodes of this condition.  This is not the worst headache of her life.  Denies any recent medication changes or head injury.  She was seen for similar symptoms a few months ago at which point she was noted to have significantly elevated blood pressure but CT scan was normal.  She has since followed up with her primary care and will be establishing with neurology but has not seen them yet.  She does report associated photophobia, phonophobia, nausea.  Denies any vomiting, dysarthria, focal weakness.  She has no concern for pregnancy.  She does not take preventative medication.    Past Medical History:  Diagnosis Date   Hypertension    Migraines    Prediabetes     Patient Active Problem List   Diagnosis Date Noted   Nonintractable episodic headache 01/07/2023   Prediabetes 07/06/2022   Anxiety and depression 03/29/2022   History of laparoscopic appendectomy 02/19/2021   Elevated serum hCG 02/09/2021   Situational anxiety 12/21/2019   Migraines 11/20/2019   Hypertension 11/20/2019   Cough 11/20/2019   Tachycardia 11/20/2019    Past Surgical History:  Procedure Laterality Date   ABDOMINAL HYSTERECTOMY     CHOLECYSTECTOMY     CYST EXCISION     LAPAROSCOPIC APPENDECTOMY N/A 02/19/2021   Procedure: APPENDECTOMY LAPAROSCOPIC;  Surgeon: Manus Rudd,  MD;  Location: [facility] OR;  Service: General;  Laterality: N/A;   LAPAROSCOPY  02/19/2021   Procedure: LAPAROSCOPY DIAGNOSTIC;  Surgeon: Manus Rudd, MD;  Location: [facility] OR;  Service: General;;   LYSIS OF ADHESION  02/19/2021   Procedure: LYSIS OF ADHESION;  Surgeon: Manus Rudd, MD;  Location: [facility] OR;  Service: General;;    OB History   No obstetric history on file.      Home Medications    Prior to Admission medications   Medication Sig Start Date End Date Taking? Authorizing Provider  baclofen (LIORESAL) 10 MG tablet Take 1 tablet (10 mg total) by mouth 2 (two) times daily as needed for muscle spasms. 01/24/23  Yes Collins Kerby, Denny Peon K, PA-C  SUMAtriptan (IMITREX) 100 MG tablet Take 1 tablet (100 mg total) by mouth every 2 (two) hours as needed for migraine. May repeat in 2 hours if headache persists or recurs. 01/03/23  Yes Gilmore Laroche, FNP  acetaminophen (TYLENOL) 500 MG tablet Take 1,000 mg by mouth every 6 (six) hours as needed for mild pain.    [provider]  atenolol (TENORMIN) 100 MG tablet Take 1 tablet by mouth once daily 01/03/23   Gilmore Laroche, FNP  Fezolinetant (VEOZAH) 45 MG TABS Take 1 tablet (45 mg total) by mouth daily. 11/16/22   Gilmore Laroche, FNP  fluticasone (FLONASE) 50 MCG/ACT nasal spray Place 1 spray into both nostrils daily. 09/23/22   Garrison, [place] N, FNP  hydrALAZINE (APRESOLINE) 10 MG tablet  Take 1 tablet (10 mg total) by mouth once for 1 dose. Take one tablet by mouth if blood pressure higher than 190 on the top number. Only take one dose in 24 hours. 12/22/22 12/22/22  Horton, Clabe Seal, DO  hydrOXYzine (VISTARIL) 25 MG capsule TAKE 1 CAPSULE BY MOUTH AT BEDTIME AS NEEDED 01/10/23   Gilmore Laroche, FNP  loperamide (IMODIUM) 2 MG capsule Take 1 capsule (2 mg total) by mouth 4 (four) times daily as needed for diarrhea or loose stools. 09/23/22   Garrison, [place] N, FNP  olmesartan (BENICAR) 40 MG tablet Take 1 tablet by mouth once daily 12/27/22   Gilmore Laroche, FNP  ondansetron (ZOFRAN-ODT) 8 MG disintegrating tablet Take 1 tablet (8 mg total) by mouth every 8 (eight) hours as needed for nausea or vomiting. 09/23/22   Garrison, [place] N, FNP  PARoxetine (PAXIL) 10 MG tablet Take 1 tablet by mouth once daily 01/04/22   Champ Mungo, DO  Riboflavin 400 MG CAPS Take 1 capsule (400 mg total) by mouth daily. 11/16/22   Gilmore Laroche, FNP  Vitamin D, Ergocalciferol, (DRISDOL) 1.25 MG (50000 UNIT) CAPS capsule Take 1 capsule (50,000 Units total) by mouth every 7 (seven) days. 11/24/22   Gilmore Laroche, FNP    Family History Family History  Problem Relation Age of Onset   Hypertension Mother    Hypertension Father     Social History Social History   Tobacco Use   Smoking status: Never   Smokeless tobacco: Never  Vaping Use   Vaping status: Never Used  Substance Use Topics   Alcohol use: No   Drug use: No     Allergies   Codeine   Review of Systems Review of Systems  Constitutional:  Positive for activity change. Negative for appetite change, fatigue and fever.  HENT:  Negative for congestion, sinus pressure, sneezing and sore throat.   Eyes:  Positive for photophobia. Negative for visual disturbance.  Respiratory:  Negative for cough and shortness of breath.   Cardiovascular:  Negative for chest pain.  Gastrointestinal:  Positive for nausea. Negative for abdominal pain, diarrhea and vomiting.  Neurological:  Positive for headaches. Negative for dizziness, seizures, syncope, facial asymmetry, speech difficulty, weakness, light-headedness and numbness.     Physical Exam Triage Vital Signs ED Triage Vitals [01/24/23 1808]  Encounter Vitals Group     BP 123/75     Systolic BP Percentile      Diastolic BP Percentile      Pulse Rate 88     Resp 16     Temp 98.9 F (37.2 C)     Temp Source Oral     SpO2 98 %     Weight 133 lb (60.3 kg)     Height 5\' 6"  (1.676 m)     Head Circumference      Peak Flow      Pain Score 6      Pain Loc      Pain Education      Exclude from Growth Chart    No data found.  Updated Vital Signs BP 123/75 (BP Location: Right Arm)   Pulse 88   Temp 98.9 F (37.2 C) (Oral)   Resp 16   Ht 5\' 6"  (1.676 m)   Wt 133 lb (60.3 kg)   SpO2 98%   BMI 21.47 kg/m   Visual Acuity Right Eye Distance:   Left Eye Distance:   Bilateral Distance:    Right Eye Near:  Left Eye Near:    Bilateral Near:     Physical Exam Vitals reviewed.  Constitutional:      General: She is awake. She is not in acute distress.    Appearance: Normal appearance. She is well-developed. She is not ill-appearing.     Comments: Very pleasant female appears stated age in no acute distress sitting comfortably in exam room  HENT:     Head: Normocephalic and atraumatic. No raccoon eyes, Battle's sign or contusion.     Right Ear: Tympanic membrane, ear canal and external ear normal. No hemotympanum.     Left Ear: Tympanic membrane, ear canal and external ear normal. No hemotympanum.     Nose: Nose normal.     Mouth/Throat:     Tongue: Tongue does not deviate from midline.     Pharynx: Uvula midline. No oropharyngeal exudate or posterior oropharyngeal erythema.  Eyes:     Extraocular Movements: Extraocular movements intact.     Pupils: Pupils are equal, round, and reactive to light.  Cardiovascular:     Rate and Rhythm: Normal rate and regular rhythm.     Heart sounds: Normal heart sounds, S1 normal and S2 normal. No murmur heard. Pulmonary:     Effort: Pulmonary effort is normal.     Breath sounds: Normal breath sounds. No wheezing, rhonchi or rales.  Musculoskeletal:     Cervical back: No spinous process tenderness or muscular tenderness.     Comments: Strength 5/5 bilateral upper and lower extremities  Neurological:     General: No focal deficit present.     Mental Status: She is alert and oriented to person, place, and time.     Cranial Nerves: Cranial nerves 2-12 are intact.     Motor: Motor  function is intact.     Coordination: Coordination is intact. Romberg sign negative.     Gait: Gait is intact.     Comments: Cranial nerves II through XII grossly intact.  No focal neurological defect on exam.  Psychiatric:        Behavior: Behavior is cooperative.      UC Treatments / Results  Labs (all labs ordered are listed, but only abnormal results are displayed) Labs Reviewed - No data to display  EKG   Radiology No results found.  Procedures Procedures (including critical care time)  Medications Ordered in UC Medications  ketorolac (TORADOL) 30 MG/ML injection 30 mg (30 mg Intramuscular Given 01/24/23 1843)  dexamethasone (DECADRON) injection 10 mg (10 mg Intramuscular Given 01/24/23 1843)    Initial Impression / Assessment and Plan / UC Course  I have reviewed the triage vital signs and the nursing notes.  Pertinent labs & imaging results that were available during my care of the patient were reviewed by me and considered in my medical decision making (see chart for details).     Patient is well-appearing, afebrile, nontoxic, nontachycardic.  Vital signs and physical exam are reassuring with no indication for emergent evaluation or imaging.  Patient was given Toradol and Decadron in clinic with improvement of symptoms.  At the time of discharge her pain improved to a 4 on a 0-10 pain scale.  We discussed that she is unable to take NSAIDs for the next 24 hours given her Toradol injection but can use acetaminophen for additional pain relief.  She was encouraged to rest and drink plenty of fluid.  Prescribed baclofen to help with her headache and we discussed that this can be sedating and she is  not to drive or drink alcohol while taking it.  She was encouraged to follow-up with her primary care to consider referral to neurology given her recurrent symptoms.  We discussed that if anything changes or worsens and she has sudden worsening of headache, worse headache of her life,  focal weakness, dysarthria, visual disturbance she needs to be seen emergently.  Strict return precautions given.  She declined work excuse note.  Final Clinical Impressions(s) / UC Diagnoses   Final diagnoses:  Migraine without aura and without status migrainosus, not intractable     Discharge Instructions      I am glad that you are feeling better after the medication.  Do not take NSAIDs (aspirin, ibuprofen/Advil, naproxen/Aleve) for 24 hours.  You can use acetaminophen/Tylenol for breakthrough pain.  Take baclofen up to twice a day.  This will make you sleepy so do not drive drink alcohol with taking it.  Make sure that you rest and drink plenty of fluid.  If you have any worsening symptoms including the worst headache of your life, sudden severe headache, nausea/vomiting, weakness, vision change, fever you need to be seen immediately.     ED Prescriptions     Medication Sig Dispense Auth. Provider   baclofen (LIORESAL) 10 MG tablet Take 1 tablet (10 mg total) by mouth 2 (two) times daily as needed for muscle spasms. 20 each Makynleigh Breslin, Noberto Retort, PA-C      PDMP not reviewed this encounter.   Jeani Hawking, PA-C 01/24/23 1913

## 2023-01-26 ENCOUNTER — Ambulatory Visit: Payer: Managed Care, Other (non HMO) | Admitting: Family Medicine

## 2023-02-10 NOTE — Progress Notes (Signed)
NEUROLOGY CONSULTATION NOTE  TOLUWANI SAILER MRN: [id number] DOB: 25-Aug-1965  Referring provider: Gilmore Laroche, FNP Primary care provider: Gilmore Laroche, FNP  Reason for consult:  migraine  Assessment/Plan:   Migraine without aura, with status migrainosus, not intractable  As these are new daily headaches, would like to evaluate closer for secondary intracranial etiology - check MRI of brain w/wo and MRA head Migraine prevention:  start topiramate 25mg  at bedtime.  We can increase to 50mg  at bedtime in 4 weeks if needed.  Already on beta blocker.  Would not use a TCA such as nortriptyline as she already is on multiple antihypertensives with breakthrough HTN. Migraine rescue:  Instead of sumatriptan, she will try rizatriptan.  Zofran for nausea Discontinue Goody powder Limit use of pain relievers to no more than 2 days out of week to prevent risk of rebound or medication-overuse headache. Keep headache diary Follow up 6-7 months.    Subjective:  MALEEAH HOEFER is a 57 year old left-handed female with HTN and prediabetes who presents for migraines.  History supplemented by ED and referring provider's notes.   She has had headaches since her 81s.  They used to occur 3 to 4 times a month.  They started becoming daily 2 months ago.  No preceding injury, new medication or new stressors.  Sleep schedule has changed as she moved from third shift to second shift 3 months ago.  She describes a diffuse throbbing headache, usually back of head but also in the front, associated with nausea and photophobia.  No vomiting, phonophobia or visual disturbance.  She has a persistent low-grade headache but they become 8/10 daily.  Takes Goody powder daily.  Lasts 1.5 hours with Marlin Canary.  PCP gave her sumatriptan.  Lasts 3-4 hours with sumatriptan.  No known triggers.  Movement aggravates it.  On 12/22/2022, she had a diffuse headache and was seen in the ED due to having a SBP over 200.  CT head  personally reviewed was unremarkable.  Labs and EKG showed no acute findings.    Past NSAIDS/analgesics:  ibuprofen, Tylenol Past abortive triptans:  none Past abortive ergotamine:  none Past muscle relaxants:  none Past anti-emetic:  promethazine Past antihypertensive medications:  none Past antidepressant medications:  paroxetine Past anticonvulsant medications:  none Past anti-CGRP:  none Past vitamins/Herbal/Supplements:  none Past antihistamines/decongestants:  none Other past therapies:  none  Current NSAIDS/analgesics:  Tylenol Current triptans:  sumatriptan 100mg  Current ergotamine:  none Current anti-emetic:  Zofran-ODT 8mg  Current muscle relaxants:  baclofen 10mg  BID PRN Current Antihypertensive medications:  atenolol 100mg  daily, olmesartan, hydralazine Current Antidepressant medications:  none Current Anticonvulsant medications:  none Current anti-CGRP:  none Current Vitamins/Herbal/Supplements:  riboflavin 400mg  Current Antihistamines/Decongestants:  flonase Other therapy:  none   Caffeine:  No coffee or soda Diet:  No coffee or soda.  Drinks at least 64 oz water daily.  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  Switched from third to second shift 3 months ago.  Gets 6 hours of sleep a night.   Family history of migraines:  no  Remote MRI of brain without contrast on 08/23/2007 personally reviewed was unremarkable.   PAST MEDICAL HISTORY: Past Medical History:  Diagnosis Date   Hypertension    Migraines    Prediabetes     PAST SURGICAL HISTORY: Past Surgical History:  Procedure Laterality Date   ABDOMINAL HYSTERECTOMY  CHOLECYSTECTOMY     CYST EXCISION     LAPAROSCOPIC APPENDECTOMY N/A 02/19/2021   Procedure: APPENDECTOMY LAPAROSCOPIC;  Surgeon: Manus Rudd, MD;  Location: [facility] OR;  Service: General;  Laterality: N/A;   LAPAROSCOPY  02/19/2021    Procedure: LAPAROSCOPY DIAGNOSTIC;  Surgeon: Manus Rudd, MD;  Location: [facility] OR;  Service: General;;   LYSIS OF ADHESION  02/19/2021   Procedure: LYSIS OF ADHESION;  Surgeon: Manus Rudd, MD;  Location: [facility] OR;  Service: General;;    MEDICATIONS: Current Outpatient Medications on File Prior to Visit  Medication Sig Dispense Refill   acetaminophen (TYLENOL) 500 MG tablet Take 1,000 mg by mouth every 6 (six) hours as needed for mild pain.     atenolol (TENORMIN) 100 MG tablet Take 1 tablet by mouth once daily 90 tablet 0   baclofen (LIORESAL) 10 MG tablet Take 1 tablet (10 mg total) by mouth 2 (two) times daily as needed for muscle spasms. 20 each 0   Fezolinetant (VEOZAH) 45 MG TABS Take 1 tablet (45 mg total) by mouth daily. 30 tablet 2   fluticasone (FLONASE) 50 MCG/ACT nasal spray Place 1 spray into both nostrils daily. 9.9 mL 2   hydrALAZINE (APRESOLINE) 10 MG tablet Take 1 tablet (10 mg total) by mouth once for 1 dose. Take one tablet by mouth if blood pressure higher than 190 on the top number. Only take one dose in 24 hours. 5 tablet 0   hydrOXYzine (VISTARIL) 25 MG capsule TAKE 1 CAPSULE BY MOUTH AT BEDTIME AS NEEDED 30 capsule 0   loperamide (IMODIUM) 2 MG capsule Take 1 capsule (2 mg total) by mouth 4 (four) times daily as needed for diarrhea or loose stools. 12 capsule 0   olmesartan (BENICAR) 40 MG tablet Take 1 tablet by mouth once daily 90 tablet 0   ondansetron (ZOFRAN-ODT) 8 MG disintegrating tablet Take 1 tablet (8 mg total) by mouth every 8 (eight) hours as needed for nausea or vomiting. 20 tablet 0   PARoxetine (PAXIL) 10 MG tablet Take 1 tablet by mouth once daily 30 tablet 0   Riboflavin 400 MG CAPS Take 1 capsule (400 mg total) by mouth daily. 30 capsule 2   SUMAtriptan (IMITREX) 100 MG tablet Take 1 tablet (100 mg total) by mouth every 2 (two) hours as needed for migraine. May repeat in 2 hours if headache persists or recurs. 10 tablet 0   Vitamin D, Ergocalciferol,  (DRISDOL) 1.25 MG (50000 UNIT) CAPS capsule Take 1 capsule (50,000 Units total) by mouth every 7 (seven) days. 20 capsule 1   No current facility-administered medications on file prior to visit.    ALLERGIES: Allergies  Allergen Reactions   Codeine Itching    FAMILY HISTORY: Family History  Problem Relation Age of Onset   Hypertension Mother    Hypertension Father     Objective:  Blood pressure 131/73, pulse 96, height 5\' 6"  (1.676 m), weight 133 lb 9.6 oz (60.6 kg), SpO2 99%. General: No acute distress.  Patient appears well-groomed.   Head:  Normocephalic/atraumatic Eyes:  fundi examined but not visualized Neck: supple, no paraspinal tenderness, full range of motion Heart: regular rate and rhythm Neurological Exam: Mental status: alert and oriented to person, place, and time, speech fluent and not dysarthric, language intact. Cranial nerves: CN I: not tested CN II: pupils equal, round and reactive to light, visual fields intact CN III, IV, VI:  full range of motion, no nystagmus, no ptosis CN V: facial sensation intact.  CN VII: upper and lower face symmetric CN VIII: hearing intact CN IX, X: gag intact, uvula midline CN XI: sternocleidomastoid and trapezius muscles intact CN XII: tongue midline Bulk & Tone: normal, no fasciculations. Motor:  muscle strength 5/5 throughout Sensation:  Temperature and vibratory sensation intact. Deep Tendon Reflexes:  2+ throughout,  toes downgoing.   Finger to nose testing:  Without dysmetria.   Gait:  Normal station and stride.  Romberg negative.    Thank you for allowing me to take part in the care of this patient.  Shon Millet, DO  CC: Gilmore Laroche, FNP

## 2023-02-11 ENCOUNTER — Ambulatory Visit: Payer: Managed Care, Other (non HMO) | Admitting: Neurology

## 2023-02-11 ENCOUNTER — Encounter: Payer: Self-pay | Admitting: Neurology

## 2023-02-11 VITALS — BP 131/73 | HR 96 | Ht 66.0 in | Wt 133.6 lb

## 2023-02-11 DIAGNOSIS — G43001 Migraine without aura, not intractable, with status migrainosus: Secondary | ICD-10-CM

## 2023-02-11 DIAGNOSIS — R519 Headache, unspecified: Secondary | ICD-10-CM

## 2023-02-11 MED ORDER — RIZATRIPTAN BENZOATE 10 MG PO TABS: 10.0000 mg | ORAL_TABLET | ORAL | 5 refills | Status: DC | PRN

## 2023-02-11 MED ORDER — TOPIRAMATE 25 MG PO TABS
25.0000 mg | ORAL_TABLET | Freq: Every day | ORAL | 5 refills | Status: DC
Start: 2023-02-11 — End: 2023-07-08

## 2023-02-11 NOTE — Patient Instructions (Addendum)
  Check MRI of brain with and without contrast and MRA of head Start topiramate 25mg  at bedtime.  If no improvement in 4 weeks, contact me and we can increase dose. STOP GOODY POWDER Instead of sumatriptan, try rizatriptan instead.  Take rizatriptan at earliest onset of headache.  May repeat dose once in 2 hours if needed.  Maximum 2 tablets in 24 hours.  If you prefer sumatriptan, let me know Ondansetron for nausea Limit use of pain relievers to no more than 2 days out of the week.  These medications include acetaminophen, NSAIDs (ibuprofen/Advil/Motrin, naproxen/Aleve, triptans (Imitrex/sumatriptan), Excedrin, and narcotics.  This will help reduce risk of rebound headaches. Be aware of common food triggers Routine exercise Stay adequately hydrated (aim for 64 oz water daily) Keep headache diary Maintain proper stress management Maintain proper sleep hygiene Do not skip meals Consider supplements:  magnesium citrate 400mg  daily, riboflavin 400mg  daily, coenzyme Q10 300mg  daily.  Follow up 6-7 months

## 2023-02-24 ENCOUNTER — Other Ambulatory Visit: Payer: Self-pay | Admitting: Family Medicine

## 2023-02-24 DIAGNOSIS — F418 Other specified anxiety disorders: Secondary | ICD-10-CM

## 2023-02-27 ENCOUNTER — Other Ambulatory Visit: Payer: Self-pay | Admitting: Family Medicine

## 2023-02-27 DIAGNOSIS — F418 Other specified anxiety disorders: Secondary | ICD-10-CM

## 2023-03-25 ENCOUNTER — Encounter: Payer: Self-pay | Admitting: Neurology

## 2023-03-25 ENCOUNTER — Other Ambulatory Visit: Payer: Self-pay | Admitting: Family Medicine

## 2023-03-25 DIAGNOSIS — Z1211 Encounter for screening for malignant neoplasm of colon: Secondary | ICD-10-CM

## 2023-03-27 ENCOUNTER — Inpatient Hospital Stay
Admission: RE | Admit: 2023-03-27 | Discharge: 2023-03-27 | Discharge status: Home / Self Care | Payer: Managed Care, Other (non HMO) | Source: Ambulatory Visit | Attending: Neurology

## 2023-03-27 DIAGNOSIS — G43001 Migraine without aura, not intractable, with status migrainosus: Secondary | ICD-10-CM

## 2023-03-27 DIAGNOSIS — R519 Headache, unspecified: Secondary | ICD-10-CM

## 2023-03-27 MED ORDER — GADOPICLENOL 0.5 MMOL/ML IV SOLN
6.0000 mL | Freq: Once | INTRAVENOUS | Status: AC | PRN
  Administered 2023-03-27: 6 mL via INTRAVENOUS

## 2023-03-28 ENCOUNTER — Other Ambulatory Visit: Payer: Self-pay | Admitting: Family Medicine

## 2023-03-28 DIAGNOSIS — F418 Other specified anxiety disorders: Secondary | ICD-10-CM

## 2023-03-31 ENCOUNTER — Other Ambulatory Visit: Payer: Self-pay | Admitting: Family Medicine

## 2023-03-31 DIAGNOSIS — I10 Essential (primary) hypertension: Secondary | ICD-10-CM

## 2023-04-01 ENCOUNTER — Telehealth: Payer: Self-pay | Admitting: Neurology

## 2023-04-01 ENCOUNTER — Other Ambulatory Visit: Payer: Self-pay | Admitting: Family Medicine

## 2023-04-01 DIAGNOSIS — F418 Other specified anxiety disorders: Secondary | ICD-10-CM

## 2023-04-01 NOTE — Telephone Encounter (Signed)
Patient is calling for MRI results.

## 2023-04-01 NOTE — Telephone Encounter (Signed)
Called patient and informed her that MRI results are not ready yet and we would call her as soon as they become available. Patient verbalized understanding and had no further questions or concerns.

## 2023-04-07 ENCOUNTER — Ambulatory Visit: Payer: Managed Care, Other (non HMO) | Admitting: Family Medicine

## 2023-04-11 ENCOUNTER — Encounter: Payer: Self-pay | Admitting: Family Medicine

## 2023-04-20 ENCOUNTER — Encounter: Payer: Self-pay | Admitting: Gastroenterology

## 2023-04-25 ENCOUNTER — Other Ambulatory Visit: Payer: Self-pay | Admitting: Family Medicine

## 2023-04-25 DIAGNOSIS — Z1231 Encounter for screening mammogram for malignant neoplasm of breast: Secondary | ICD-10-CM

## 2023-04-25 NOTE — Telephone Encounter (Signed)
After reviewing patient imaging order on 10/6.    Note found added by Nurse at [facility],   Study Notes   Norval Morton, RT on 03/27/2023 12:57 PM  [facility] To Read     Note added without consulting the order phsyican's office okaying the note or Change. SO the Image was never read by [facility] nor [facility].     Called the Reading room, Note changed and sent to the correct Que to be read.

## 2023-04-25 NOTE — Telephone Encounter (Signed)
Patient is calling for MRI results.

## 2023-04-28 ENCOUNTER — Inpatient Hospital Stay: Admission: RE | Admit: 2023-04-28 | Payer: Managed Care, Other (non HMO) | Source: Ambulatory Visit

## 2023-05-05 ENCOUNTER — Ambulatory Visit: Payer: Managed Care, Other (non HMO) | Admitting: *Deleted

## 2023-05-05 VITALS — Ht 66.0 in | Wt 127.0 lb

## 2023-05-05 DIAGNOSIS — Z1211 Encounter for screening for malignant neoplasm of colon: Secondary | ICD-10-CM

## 2023-05-05 MED ORDER — NA SULFATE-K SULFATE-MG SULF 17.5-3.13-1.6 GM/177ML PO SOLN: 1.0000 | Freq: Once | ORAL | 0 refills | Status: DC

## 2023-05-05 NOTE — Progress Notes (Signed)
Pt's name and DOB verified at the beginning of the pre-visit wit 2 identifiers  Pt denies any difficulty with ambulating,sitting, laying down or rolling side to side  Pt has issues with ambulation   Pt has no issues moving head neck or swallowing  No egg or soy allergy known to patient   No issues known to pt with past sedation with any surgeries or procedures  Pt denies having issues being intubated   No FH of Malignant Hyperthermia  Pt is not on diet pills or shots   Pt is not on home 02   Pt is not on blood thinners  Pt is on blood thinners  Pt denies issues with constipation   Pt is not on dialysis  Pt denise any abnormal heart rhythms   Pt denies any upcoming cardiac testing  Pt encouraged to use to use Singlecare or Goodrx to reduce cost   Patient's chart reviewed by Cathlyn Parsons CNRA prior to pre-visit and patient appropriate for the [facility].  Pre-visit completed and red dot placed by patient's name on their procedure day (on provider's schedule).  .   Visit in person Pt scale weight is 127 lb  Instructed pt why it is important to and  to call if they have any changes in health or new medications. Directed them to the # given and on instructions.     Instructions reviewed. Pt given both [facility] main # and MD on call # prior to instructions.  Pt states understanding. Instructed to review again prior to procedure. Pt states they will.    Instructions and coupon given to pt   Instructions sent through My Chart

## 2023-05-09 ENCOUNTER — Ambulatory Visit: Payer: Managed Care, Other (non HMO) | Admitting: Neurology

## 2023-05-17 ENCOUNTER — Encounter: Payer: Self-pay | Admitting: Gastroenterology

## 2023-05-22 ENCOUNTER — Other Ambulatory Visit: Payer: Self-pay | Admitting: Family Medicine

## 2023-05-22 DIAGNOSIS — F418 Other specified anxiety disorders: Secondary | ICD-10-CM

## 2023-05-23 ENCOUNTER — Encounter (INDEPENDENT_AMBULATORY_CARE_PROVIDER_SITE_OTHER): Payer: Self-pay | Admitting: *Deleted

## 2023-05-24 ENCOUNTER — Other Ambulatory Visit: Payer: Self-pay | Admitting: Family Medicine

## 2023-05-24 DIAGNOSIS — F418 Other specified anxiety disorders: Secondary | ICD-10-CM

## 2023-05-26 ENCOUNTER — Telehealth: Payer: Self-pay | Admitting: Gastroenterology

## 2023-05-26 ENCOUNTER — Other Ambulatory Visit: Payer: Self-pay

## 2023-05-26 DIAGNOSIS — Z1211 Encounter for screening for malignant neoplasm of colon: Secondary | ICD-10-CM

## 2023-05-26 MED ORDER — NA SULFATE-K SULFATE-MG SULF 17.5-3.13-1.6 GM/177ML PO SOLN
1.0000 | Freq: Once | ORAL | 0 refills | Status: AC
Start: 2023-05-26 — End: 2023-05-26

## 2023-05-26 NOTE — Telephone Encounter (Signed)
[facility]. [facility]. VM left making patient aware. Pt to call back if she would like [facility] a [facility]. Please verify which location if she calls back.

## 2023-05-26 NOTE — Telephone Encounter (Signed)
Inbound call from patient, would [facility]. Patient states she did not pick it up on time.

## 2023-05-30 ENCOUNTER — Ambulatory Visit: Payer: Managed Care, Other (non HMO) | Admitting: Family Medicine

## 2023-05-30 ENCOUNTER — Encounter: Payer: Self-pay | Admitting: Family Medicine

## 2023-05-30 VITALS — BP 123/79 | HR 87 | Wt 126.0 lb

## 2023-05-30 DIAGNOSIS — R7301 Impaired fasting glucose: Secondary | ICD-10-CM | POA: Diagnosis not present

## 2023-05-30 DIAGNOSIS — G479 Sleep disorder, unspecified: Secondary | ICD-10-CM | POA: Diagnosis not present

## 2023-05-30 DIAGNOSIS — E7849 Other hyperlipidemia: Secondary | ICD-10-CM

## 2023-05-30 DIAGNOSIS — E559 Vitamin D deficiency, unspecified: Secondary | ICD-10-CM

## 2023-05-30 DIAGNOSIS — F418 Other specified anxiety disorders: Secondary | ICD-10-CM

## 2023-05-30 DIAGNOSIS — E038 Other specified hypothyroidism: Secondary | ICD-10-CM

## 2023-05-30 DIAGNOSIS — F32A Depression, unspecified: Secondary | ICD-10-CM

## 2023-05-30 MED ORDER — HYDROXYZINE PAMOATE 25 MG PO CAPS
25.0000 mg | ORAL_CAPSULE | Freq: Every evening | ORAL | 1 refills | Status: DC | PRN
Start: 2023-05-30 — End: 2023-06-27

## 2023-05-30 NOTE — Assessment & Plan Note (Signed)
Stable Denies SI Refills sent to pharmacy

## 2023-05-30 NOTE — Assessment & Plan Note (Signed)
    05/30/2023   10:32 AM 01/05/2023    8:09 AM 11/16/2022   11:20 AM 08/17/2022   10:18 AM  GAD 7 : Generalized Anxiety Score  Nervous, Anxious, on Edge 1 3 1 1   Control/stop worrying 0 2 0 1  Worry too much - different things 0 2 0 0  Trouble relaxing 1 2 1 1   Restless 0 2 0 0  Easily annoyed or irritable 1 2 2 1   Afraid - awful might happen 0 0 0 0  Total GAD 7 Score 3 13 4 4   Anxiety Difficulty Somewhat difficult Somewhat difficult Somewhat difficult Somewhat difficult  Denies Suicidal thoughts and ideation Refills sent to the pharmacy

## 2023-05-30 NOTE — Patient Instructions (Signed)
I appreciate the opportunity to provide care to you today!    Follow up:  4 months  Labs: please stop by the lab today to get your blood drawn (CBC, CMP, TSH, Lipid profile, HgA1c, Vit D)  Non-Pharmacological Management for Sleep Hygiene:  Establish a Consistent Bedtime Routine: -Develop and adhere to a regular sleep and wake schedule. -Avoid using electronic devices, including computers and smartphones, at least one hour before bedtime. -If unable to fall asleep within 15 minutes, refrain from staying in bed and engage in a relaxing activity until you feel sleepy. Reduce Daily Stress: -Engage in stress-reducing activities before bedtime to help relax your mind and body. Avoid intense physical exercise and stimulant use, such as caffeine, late in the day. Optimize Sleep Environment: -Use the bed and bedroom exclusively for sleep and intimate activities. -Consider removing electronic devices from the sleeping area and limit screen time prior to bedtime. Incorporate Relaxation Techniques: -Practice abdominal breathing and meditation to promote relaxation. Utilize progressive muscle relaxation and visualization techniques to aid in achieving restful sleep.     Please continue to a heart-healthy diet and increase your physical activities. Try to exercise for at least five days a week.    It was a pleasure to see you and I look forward to continuing to work together on your health and well-being. Please do not hesitate to call the office if you need care or have questions about your care.  In case of emergency, please visit the Emergency Department for urgent care, or contact our clinic at [phone] to schedule an appointment. We're here to help you!   Have a wonderful day and week. With Gratitude, Gilmore Laroche MSN, FNP-BC

## 2023-05-30 NOTE — Progress Notes (Signed)
Established Patient Office Visit  Subjective:  Patient ID: [name], female    DOB: 22-May-1966  Age: 57 y.o. MRN: [id number]  CC:  Chief Complaint  Patient presents with   Anxiety    Pt needing to discuss anxiety medication and has    HPI [name] is a 57 y.o. female presents for mediation refills. For the details of today's visit, please refer to the assessment and plan.     Past Medical History:  Diagnosis Date   Hypertension    Migraines    Prediabetes     Past Surgical History:  Procedure Laterality Date   ABDOMINAL HYSTERECTOMY     APPENDECTOMY     CHOLECYSTECTOMY     CYST EXCISION     LAPAROSCOPIC APPENDECTOMY N/A 02/19/2021   Procedure: APPENDECTOMY LAPAROSCOPIC;  Surgeon: Manus Rudd, MD;  Location: [facility] OR;  Service: General;  Laterality: N/A;   LAPAROSCOPY  02/19/2021   Procedure: LAPAROSCOPY DIAGNOSTIC;  Surgeon: Manus Rudd, MD;  Location: [facility] OR;  Service: General;;   LYSIS OF ADHESION  02/19/2021   Procedure: LYSIS OF ADHESION;  Surgeon: Manus Rudd, MD;  Location: [facility] OR;  Service: General;;    Family History  Problem Relation Age of Onset   Hypertension Mother    Hypertension Father    Colon cancer Neg Hx    Colon polyps Neg Hx    Esophageal cancer Neg Hx    Stomach cancer Neg Hx    Rectal cancer Neg Hx     Social History   Socioeconomic History   Marital status: Legally Separated    Spouse name: Not on file   Number of children: Not on file   Years of education: Not on file   Highest education level: 12th grade  Occupational History   Not on file  Tobacco Use   Smoking status: Never   Smokeless tobacco: Never  Vaping Use   Vaping status: Never Used  Substance and Sexual Activity   Alcohol use: No   Drug use: No   Sexual activity: Not on file  Other Topics Concern   Not on file  Social History Narrative   Are you right handed or left handed? left   Are you currently employed ? Y   What is your current  occupation? [employer]   Do you live at home alone? no   Who lives with you?    What type of home do you live in: 1 story or 2 story? 2       Social Determinants of Health   Financial Resource Strain: Low Risk  (05/26/2023)   Overall Financial Resource Strain (CARDIA)    Difficulty of Paying Living Expenses: Not hard at all  Food Insecurity: No Food Insecurity (05/26/2023)   Hunger Vital Sign    Worried About Running Out of Food in the Last Year: Never true    Ran Out of Food in the Last Year: Never true  Transportation Needs: No Transportation Needs (05/26/2023)   PRAPARE - Administrator, Civil Service (Medical): No    Lack of Transportation (Non-Medical): No  Physical Activity: Unknown (05/26/2023)   Exercise Vital Sign    Days of Exercise per Week: 6 days    Minutes of Exercise per Session: Patient declined  Stress: Stress Concern Present (05/26/2023)   [employer] of Occupational Health - Occupational Stress Questionnaire    Feeling of Stress : Very much  Social Connections: Unknown (05/26/2023)  Social Advertising account executive [NHANES]    Frequency of Communication with Friends and Family: More than three times a week    Frequency of Social Gatherings with Friends and Family: Once a week    Attends Religious Services: More than 4 times per year    Active Member of [employer] or Organizations: Patient declined    Attends Banker Meetings: Not on file    Marital Status: Separated  Intimate Partner Violence: Unknown (09/24/2021)   Received from [employer], [facility]   HITS    Physically Hurt: Not on file    Insult or Talk Down To: Not on file    Threaten Physical Harm: Not on file    Scream or Curse: Not on file    Outpatient Medications Prior to Visit  Medication Sig Dispense Refill   atenolol (TENORMIN) 100 MG tablet Take 1 tablet by mouth once daily 90 tablet 0   baclofen (LIORESAL) 10 MG tablet Take 1 tablet (10 mg total) by mouth  2 (two) times daily as needed for muscle spasms. 20 each 0   fluticasone (FLONASE) 50 MCG/ACT nasal spray Place 1 spray into both nostrils daily. 9.9 mL 2   olmesartan (BENICAR) 40 MG tablet Take 1 tablet by mouth once daily 90 tablet 0   ondansetron (ZOFRAN-ODT) 8 MG disintegrating tablet Take 1 tablet (8 mg total) by mouth every 8 (eight) hours as needed for nausea or vomiting. 20 tablet 0   PARoxetine (PAXIL) 10 MG tablet Take 1 tablet by mouth once daily 30 tablet 0   rizatriptan (MAXALT) 10 MG tablet Take 1 tablet (10 mg total) by mouth as needed for migraine. May repeat in 2 hours if needed.  Maximum 2 tablets in 24 hours. 10 tablet 5   SUMAtriptan (IMITREX) 100 MG tablet Take 1 tablet (100 mg total) by mouth every 2 (two) hours as needed for migraine. May repeat in 2 hours if headache persists or recurs. 10 tablet 0   topiramate (TOPAMAX) 25 MG tablet Take 1 tablet (25 mg total) by mouth at bedtime. 30 tablet 5   hydrOXYzine (VISTARIL) 25 MG capsule TAKE 1 CAPSULE BY MOUTH AT BEDTIME AS NEEDED 30 capsule 0   No facility-administered medications prior to visit.    Allergies  Allergen Reactions   Codeine Itching    ROS Review of Systems  Constitutional:  Negative for chills and fever.  Eyes:  Negative for visual disturbance.  Respiratory:  Negative for chest tightness and shortness of breath.   Neurological:  Negative for dizziness and headaches.      Objective:    Physical Exam HENT:     Head: Normocephalic.     Mouth/Throat:     Mouth: Mucous membranes are moist.  Cardiovascular:     Rate and Rhythm: Normal rate.     Heart sounds: Normal heart sounds.  Pulmonary:     Effort: Pulmonary effort is normal.     Breath sounds: Normal breath sounds.  Neurological:     Mental Status: She is alert.     BP 123/79   Pulse 87   Wt 126 lb 0.6 oz (57.2 kg)   SpO2 95%   BMI 20.34 kg/m  Wt Readings from Last 3 Encounters:  05/30/23 126 lb 0.6 oz (57.2 kg)  05/05/23 127 lb  (57.6 kg)  02/11/23 133 lb 9.6 oz (60.6 kg)    Lab Results  Component Value Date   TSH 0.621 11/23/2022   Lab Results  Component Value Date   WBC 8.2 12/22/2022   HGB 11.5 (L) 12/22/2022   HCT 36.9 12/22/2022   MCV 93.4 12/22/2022   PLT 326 12/22/2022   Lab Results  Component Value Date   NA 139 12/22/2022   K 3.7 12/22/2022   CO2 25 12/22/2022   GLUCOSE 94 12/22/2022   BUN 14 12/22/2022   CREATININE 0.94 12/22/2022   BILITOT 0.3 11/23/2022   ALKPHOS 61 11/23/2022   AST 15 11/23/2022   ALT 9 11/23/2022   PROT 7.2 11/23/2022   ALBUMIN 4.1 11/23/2022   CALCIUM 9.0 12/22/2022   ANIONGAP 8 12/22/2022   EGFR 77 11/23/2022   Lab Results  Component Value Date   CHOL 176 11/23/2022   Lab Results  Component Value Date   HDL 43 11/23/2022   Lab Results  Component Value Date   LDLCALC 96 11/23/2022   Lab Results  Component Value Date   TRIG 216 (H) 11/23/2022   Lab Results  Component Value Date   CHOLHDL 4.1 11/23/2022   Lab Results  Component Value Date   HGBA1C 6.1 (H) 11/23/2022      Assessment & Plan:  Anxiety and depression Assessment & Plan: Stable Denies SI Refills sent to pharmacy   Situational anxiety Assessment & Plan:    05/30/2023   10:32 AM 01/05/2023    8:09 AM 11/16/2022   11:20 AM 08/17/2022   10:18 AM  GAD 7 : Generalized Anxiety Score  Nervous, Anxious, on Edge 1 3 1 1   Control/stop worrying 0 2 0 1  Worry too much - different things 0 2 0 0  Trouble relaxing 1 2 1 1   Restless 0 2 0 0  Easily annoyed or irritable 1 2 2 1   Afraid - awful might happen 0 0 0 0  Total GAD 7 Score 3 13 4 4   Anxiety Difficulty Somewhat difficult Somewhat difficult Somewhat difficult Somewhat difficult  Denies Suicidal thoughts and ideation Refills sent to the pharmacy  Orders: -     hydrOXYzine Pamoate; Take 1 capsule (25 mg total) by mouth at bedtime as needed.  Dispense: 60 capsule; Refill: 1  Sleep disturbance Assessment &  Plan:  Non-Pharmacological Management for Sleep Hygiene:  Establish a Consistent Bedtime Routine: -Develop and adhere to a regular sleep and wake schedule. -Avoid using electronic devices, including computers and smartphones, at least one hour before bedtime. -If unable to fall asleep within 15 minutes, refrain from staying in bed and engage in a relaxing activity until you feel sleepy. Reduce Daily Stress: -Engage in stress-reducing activities before bedtime to help relax your mind and body. Avoid intense physical exercise and stimulant use, such as caffeine, late in the day. Optimize Sleep Environment: -Use the bed and bedroom exclusively for sleep and intimate activities. -Consider removing electronic devices from the sleeping area and limit screen time prior to bedtime. Incorporate Relaxation Techniques: -Practice abdominal breathing and meditation to promote relaxation. Utilize progressive muscle relaxation and visualization techniques to aid in achieving restful sleep.    IFG (impaired fasting glucose) -     Hemoglobin A1c  Vitamin D deficiency -     VITAMIN D 25 Hydroxy (Vit-D Deficiency, Fractures)  TSH (thyroid-stimulating hormone deficiency) -     TSH + free T4  Other hyperlipidemia -     Lipid panel -     CMP14+EGFR -     CBC with Differential/Platelet   Note: This chart has been completed using Chief Executive Officer, and  while attempts have been made to ensure accuracy, certain words and phrases may not be transcribed as intended.   Follow-up: Return in about 4 months (around 09/28/2023).   Gilmore Laroche, FNP

## 2023-05-30 NOTE — Assessment & Plan Note (Signed)
  Non-Pharmacological Management for Sleep Hygiene:  Establish a Consistent Bedtime Routine: -Develop and adhere to a regular sleep and wake schedule. -Avoid using electronic devices, including computers and smartphones, at least one hour before bedtime. -If unable to fall asleep within 15 minutes, refrain from staying in bed and engage in a relaxing activity until you feel sleepy. Reduce Daily Stress: -Engage in stress-reducing activities before bedtime to help relax your mind and body. Avoid intense physical exercise and stimulant use, such as caffeine, late in the day. Optimize Sleep Environment: -Use the bed and bedroom exclusively for sleep and intimate activities. -Consider removing electronic devices from the sleeping area and limit screen time prior to bedtime. Incorporate Relaxation Techniques: -Practice abdominal breathing and meditation to promote relaxation. Utilize progressive muscle relaxation and visualization techniques to aid in achieving restful sleep.

## 2023-05-31 LAB — CBC WITH DIFFERENTIAL/PLATELET
Basophils Absolute: 0.1 10*3/uL (ref 0.0–0.2)
Basos: 1 %
EOS (ABSOLUTE): 0.1 10*3/uL (ref 0.0–0.4)
Eos: 2 %
Hematocrit: 38 % (ref 34.0–46.6)
Hemoglobin: 11.9 g/dL (ref 11.1–15.9)
Immature Grans (Abs): 0 10*3/uL (ref 0.0–0.1)
Immature Granulocytes: 0 %
Lymphocytes Absolute: 1.7 10*3/uL (ref 0.7–3.1)
Lymphs: 31 %
MCH: 29.5 pg (ref 26.6–33.0)
MCHC: 31.3 g/dL — ABNORMAL LOW (ref 31.5–35.7)
MCV: 94 fL (ref 79–97)
Monocytes Absolute: 0.5 10*3/uL (ref 0.1–0.9)
Monocytes: 9 %
Neutrophils Absolute: 3.2 10*3/uL (ref 1.4–7.0)
Neutrophils: 57 %
Platelets: 374 10*3/uL (ref 150–450)
RBC: 4.03 x10E6/uL (ref 3.77–5.28)
RDW: 14.1 % (ref 11.7–15.4)
WBC: 5.6 10*3/uL (ref 3.4–10.8)

## 2023-05-31 LAB — CMP14+EGFR
ALT: 11 [IU]/L (ref 0–32)
AST: 14 [IU]/L (ref 0–40)
Albumin: 4.2 g/dL (ref 3.8–4.9)
Alkaline Phosphatase: 64 [IU]/L (ref 44–121)
BUN/Creatinine Ratio: 13 (ref 9–23)
BUN: 12 mg/dL (ref 6–24)
Bilirubin Total: 0.3 mg/dL (ref 0.0–1.2)
CO2: 22 mmol/L (ref 20–29)
Calcium: 9.6 mg/dL (ref 8.7–10.2)
Chloride: 107 mmol/L — ABNORMAL HIGH (ref 96–106)
Creatinine, Ser: 0.92 mg/dL (ref 0.57–1.00)
Globulin, Total: 2.7 g/dL (ref 1.5–4.5)
Glucose: 90 mg/dL (ref 70–99)
Potassium: 4.3 mmol/L (ref 3.5–5.2)
Sodium: 143 mmol/L (ref 134–144)
Total Protein: 6.9 g/dL (ref 6.0–8.5)
eGFR: 73 mL/min/{1.73_m2} (ref >59)

## 2023-05-31 LAB — LIPID PANEL
Chol/HDL Ratio: 4.1 {ratio} (ref 0.0–4.4)
Cholesterol, Total: 219 mg/dL — ABNORMAL HIGH (ref 100–199)
HDL: 54 mg/dL (ref >39)
LDL Chol Calc (NIH): 145 mg/dL — ABNORMAL HIGH (ref 0–99)
Triglycerides: 110 mg/dL (ref 0–149)
VLDL Cholesterol Cal: 20 mg/dL (ref 5–40)

## 2023-05-31 LAB — HEMOGLOBIN A1C
Est. average glucose Bld gHb Est-mCnc: 126 mg/dL
Hgb A1c MFr Bld: 6 % — ABNORMAL HIGH (ref 4.8–5.6)

## 2023-05-31 LAB — VITAMIN D 25 HYDROXY (VIT D DEFICIENCY, FRACTURES): Vit D, 25-Hydroxy: 14.2 ng/mL — ABNORMAL LOW (ref 30.0–100.0)

## 2023-05-31 LAB — TSH+FREE T4
Free T4: 1.11 ng/dL (ref 0.82–1.77)
TSH: 0.412 u[IU]/mL — ABNORMAL LOW (ref 0.450–4.500)

## 2023-06-03 ENCOUNTER — Encounter: Payer: Self-pay | Admitting: Gastroenterology

## 2023-06-03 ENCOUNTER — Ambulatory Visit (AMBULATORY_SURGERY_CENTER): Payer: Managed Care, Other (non HMO) | Admitting: Gastroenterology

## 2023-06-03 VITALS — BP 118/60 | HR 95 | Temp 97.7°F | Resp 17 | Ht 66.0 in | Wt 127.0 lb

## 2023-06-03 DIAGNOSIS — Z1211 Encounter for screening for malignant neoplasm of colon: Secondary | ICD-10-CM

## 2023-06-03 DIAGNOSIS — K648 Other hemorrhoids: Secondary | ICD-10-CM

## 2023-06-03 DIAGNOSIS — K644 Residual hemorrhoidal skin tags: Secondary | ICD-10-CM | POA: Diagnosis not present

## 2023-06-03 DIAGNOSIS — K5730 Diverticulosis of large intestine without perforation or abscess without bleeding: Secondary | ICD-10-CM | POA: Diagnosis not present

## 2023-06-03 MED ORDER — SODIUM CHLORIDE 0.9 % IV SOLN
500.0000 mL | Freq: Once | INTRAVENOUS | Status: DC
Start: 2023-06-03 — End: 2023-06-03

## 2023-06-03 NOTE — Progress Notes (Signed)
[facility] History and Physical   Primary Care Physician:  Gilmore Laroche, FNP   Reason for Procedure:  Colorectal cancer screening  Plan:    Screening colonoscopy with possible interventions as needed     HPI: [name] is a very pleasant 57 y.o. female here for screening colonoscopy. Denies any nausea, vomiting, abdominal pain, melena or bright red blood per rectum  The risks and benefits as well as alternatives of endoscopic procedure(s) have been discussed and reviewed. All questions answered. The patient agrees to proceed.    Past Medical History:  Diagnosis Date   Hypertension    Migraines    Prediabetes     Past Surgical History:  Procedure Laterality Date   ABDOMINAL HYSTERECTOMY     APPENDECTOMY     CHOLECYSTECTOMY     CYST EXCISION     LAPAROSCOPIC APPENDECTOMY N/A 02/19/2021   Procedure: APPENDECTOMY LAPAROSCOPIC;  Surgeon: Manus Rudd, MD;  Location: [facility] OR;  Service: General;  Laterality: N/A;   LAPAROSCOPY  02/19/2021   Procedure: LAPAROSCOPY DIAGNOSTIC;  Surgeon: Manus Rudd, MD;  Location: [facility] OR;  Service: General;;   LYSIS OF ADHESION  02/19/2021   Procedure: LYSIS OF ADHESION;  Surgeon: Manus Rudd, MD;  Location: [facility] OR;  Service: General;;    Prior to Admission medications   Medication Sig Start Date End Date Taking? Authorizing Provider  atenolol (TENORMIN) 100 MG tablet Take 1 tablet by mouth once daily 04/04/23  Yes Gilmore Laroche, FNP  hydrOXYzine (VISTARIL) 25 MG capsule Take 1 capsule (25 mg total) by mouth at bedtime as needed. 05/30/23  Yes Gilmore Laroche, FNP  olmesartan (BENICAR) 40 MG tablet Take 1 tablet by mouth once daily 03/31/23  Yes Gilmore Laroche, FNP  topiramate (TOPAMAX) 25 MG tablet Take 1 tablet (25 mg total) by mouth at bedtime. 02/11/23  Yes Jaffe, Adam R, DO  baclofen (LIORESAL) 10 MG tablet Take 1 tablet (10 mg total) by mouth 2 (two) times daily as needed for muscle spasms. 01/24/23   Raspet,  Erin K, PA-C  fluticasone (FLONASE) 50 MCG/ACT nasal spray Place 1 spray into both nostrils daily. 09/23/22   Garrison, [place] N, FNP  ondansetron (ZOFRAN-ODT) 8 MG disintegrating tablet Take 1 tablet (8 mg total) by mouth every 8 (eight) hours as needed for nausea or vomiting. 09/23/22   Garrison, [place] N, FNP  PARoxetine (PAXIL) 10 MG tablet Take 1 tablet by mouth once daily 01/04/22   Champ Mungo, DO  rizatriptan (MAXALT) 10 MG tablet Take 1 tablet (10 mg total) by mouth as needed for migraine. May repeat in 2 hours if needed.  Maximum 2 tablets in 24 hours. 02/11/23   Drema Dallas, DO  SUMAtriptan (IMITREX) 100 MG tablet Take 1 tablet (100 mg total) by mouth every 2 (two) hours as needed for migraine. May repeat in 2 hours if headache persists or recurs. 01/03/23   Gilmore Laroche, FNP    Current Outpatient Medications  Medication Sig Dispense Refill   atenolol (TENORMIN) 100 MG tablet Take 1 tablet by mouth once daily 90 tablet 0   hydrOXYzine (VISTARIL) 25 MG capsule Take 1 capsule (25 mg total) by mouth at bedtime as needed. 60 capsule 1   olmesartan (BENICAR) 40 MG tablet Take 1 tablet by mouth once daily 90 tablet 0   topiramate (TOPAMAX) 25 MG tablet Take 1 tablet (25 mg total) by mouth at bedtime. 30 tablet 5   baclofen (LIORESAL) 10 MG tablet Take 1 tablet (10 mg  total) by mouth 2 (two) times daily as needed for muscle spasms. 20 each 0   fluticasone (FLONASE) 50 MCG/ACT nasal spray Place 1 spray into both nostrils daily. 9.9 mL 2   ondansetron (ZOFRAN-ODT) 8 MG disintegrating tablet Take 1 tablet (8 mg total) by mouth every 8 (eight) hours as needed for nausea or vomiting. 20 tablet 0   PARoxetine (PAXIL) 10 MG tablet Take 1 tablet by mouth once daily 30 tablet 0   rizatriptan (MAXALT) 10 MG tablet Take 1 tablet (10 mg total) by mouth as needed for migraine. May repeat in 2 hours if needed.  Maximum 2 tablets in 24 hours. 10 tablet 5   SUMAtriptan (IMITREX) 100 MG tablet Take 1 tablet  (100 mg total) by mouth every 2 (two) hours as needed for migraine. May repeat in 2 hours if headache persists or recurs. 10 tablet 0   Current Facility-Administered Medications  Medication Dose Route Frequency Provider Last Rate Last Admin   0.9 %  sodium chloride infusion  500 mL Intravenous Once Napoleon Form, MD        Allergies as of 06/03/2023 - Review Complete 06/03/2023  Allergen Reaction Noted   Codeine Itching 12/17/2015    Family History  Problem Relation Age of Onset   Hypertension Mother    Hypertension Father    Colon cancer Neg Hx    Colon polyps Neg Hx    Esophageal cancer Neg Hx    Stomach cancer Neg Hx    Rectal cancer Neg Hx     Social History   Socioeconomic History   Marital status: Legally Separated    Spouse name: Not on file   Number of children: Not on file   Years of education: Not on file   Highest education level: 12th grade  Occupational History   Not on file  Tobacco Use   Smoking status: Never   Smokeless tobacco: Never  Vaping Use   Vaping status: Never Used  Substance and Sexual Activity   Alcohol use: No   Drug use: No   Sexual activity: Not on file  Other Topics Concern   Not on file  Social History Narrative   Are you right handed or left handed? left   Are you currently employed ? Y   What is your current occupation? [employer]   Do you live at home alone? no   Who lives with you?    What type of home do you live in: 1 story or 2 story? 2       Social Drivers of Corporate investment banker Strain: Low Risk  (05/26/2023)   Overall Financial Resource Strain (CARDIA)    Difficulty of Paying Living Expenses: Not hard at all  Food Insecurity: No Food Insecurity (05/26/2023)   Hunger Vital Sign    Worried About Running Out of Food in the Last Year: Never true    Ran Out of Food in the Last Year: Never true  Transportation Needs: No Transportation Needs (05/26/2023)   PRAPARE - Administrator, Civil Service  (Medical): No    Lack of Transportation (Non-Medical): No  Physical Activity: Unknown (05/26/2023)   Exercise Vital Sign    Days of Exercise per Week: 6 days    Minutes of Exercise per Session: Patient declined  Stress: Stress Concern Present (05/26/2023)   [employer] of Occupational Health - Occupational Stress Questionnaire    Feeling of Stress : Very much  Social Connections:  Unknown (05/26/2023)   Social Connection and Isolation Panel [NHANES]    Frequency of Communication with Friends and Family: More than three times a week    Frequency of Social Gatherings with Friends and Family: Once a week    Attends Religious Services: More than 4 times per year    Active Member of [employer] or Organizations: Patient declined    Attends Banker Meetings: Not on file    Marital Status: Separated  Intimate Partner Violence: Unknown (09/24/2021)   Received from [employer], [facility]   HITS    Physically Hurt: Not on file    Insult or Talk Down To: Not on file    Threaten Physical Harm: Not on file    Scream or Curse: Not on file    Review of Systems:  All other review of systems negative except as mentioned in the HPI.  Physical Exam: Vital signs in last 24 hours: BP 121/81   Pulse (!) 129   Temp 97.7 F (36.5 C)   Ht 5\' 6"  (1.676 m)   Wt 127 lb (57.6 kg)   SpO2 100%   BMI 20.50 kg/m  General:   Alert, NAD Lungs:  Clear .   Heart:  Regular rate and rhythm Abdomen:  Soft, nontender and nondistended. Neuro/Psych:  Alert and cooperative. Normal mood and affect. A and O x 3  Reviewed labs, radiology imaging, old records and pertinent past GI work up  Patient is appropriate for planned procedure(s) and anesthesia in an ambulatory setting   K. Scherry Ran , MD [phone]

## 2023-06-03 NOTE — Progress Notes (Signed)
Pt's states no medical or surgical changes since previsit or office visit. 

## 2023-06-03 NOTE — Op Note (Signed)
[facility] Patient Name: [name] Procedure Date: 06/03/2023 11:44 AM MRN: [id number] Endoscopist: Napoleon Form , MD, [id number] Age: 57 Referring MD:  Date of Birth: May 28, 1966 Gender: Female Account #: [id number] Procedure:                Colonoscopy Indications:              Screening for colorectal malignant neoplasm Medicines:                Monitored Anesthesia Care Procedure:                Pre-Anesthesia Assessment:                           - Prior to the procedure, a History and Physical                            was performed, and patient medications and                            allergies were reviewed. The patient's tolerance of                            previous anesthesia was also reviewed. The risks                            and benefits of the procedure and the sedation                            options and risks were discussed with the patient.                            All questions were answered, and informed consent                            was obtained. Prior Anticoagulants: The patient has                            taken no anticoagulant or antiplatelet agents. ASA                            Grade Assessment: II - A patient with mild systemic                            disease. After reviewing the risks and benefits,                            the patient was deemed in satisfactory condition to                            undergo the procedure.                           After obtaining informed consent, the colonoscope  was passed under direct vision. Throughout the                            procedure, the patient's blood pressure, pulse, and                            oxygen saturations were monitored continuously. The                            Olympus Scope [id number] was introduced through the                            anus and advanced to the the cecum, identified by                             appendiceal orifice and ileocecal valve. The                            colonoscopy was performed without difficulty. The                            patient tolerated the procedure well. The quality                            of the bowel preparation was good. The ileocecal                            valve, appendiceal orifice, and rectum were                            photographed. Scope In: 11:52:56 AM Scope Out: 12:07:49 PM Scope Withdrawal Time: 0 hours 7 minutes 38 seconds  Total Procedure Duration: 0 hours 14 minutes 53 seconds  Findings:                 The perianal and digital rectal examinations were                            normal.                           A few small-mouthed diverticula were found in the                            sigmoid colon and ascending colon.                           Non-bleeding external and internal hemorrhoids were                            found during retroflexion. The hemorrhoids were                            small.  The exam was otherwise without abnormality. Complications:            No immediate complications. Estimated Blood Loss:     Estimated blood loss: none. Impression:               - Diverticulosis in the sigmoid colon and in the                            ascending colon.                           - Non-bleeding external and internal hemorrhoids.                           - The examination was otherwise normal.                           - No specimens collected. Recommendation:           - Patient has a contact number available for                            emergencies. The signs and symptoms of potential                            delayed complications were discussed with the                            patient. Return to normal activities tomorrow.                            Written discharge instructions were provided to the                            patient.                           - Resume  previous diet.                           - Continue present medications.                           - Repeat colonoscopy in 10 years for surveillance. Napoleon Form, MD 06/03/2023 12:10:50 PM This report has been signed electronically.

## 2023-06-03 NOTE — Patient Instructions (Signed)
You may resume all of your previous medications today. Read all of your discharge instructions.  YOU HAD AN ENDOSCOPIC PROCEDURE TODAY AT THE [facility]:   Refer to the procedure report that was given to you for any specific questions about what was found during the examination.  If the procedure report does not answer your questions, please call your gastroenterologist to clarify.  If you requested that your care partner not be given the details of your procedure findings, then the procedure report has been included in a sealed envelope for you to review at your convenience later.  YOU SHOULD EXPECT: Some feelings of bloating in the abdomen. Passage of more gas than usual.  Walking can help get rid of the air that was put into your GI tract during the procedure and reduce the bloating. If you had a lower endoscopy (such as a colonoscopy or flexible sigmoidoscopy) you may notice spotting of blood in your stool or on the toilet paper. If you underwent a bowel prep for your procedure, you may not have a normal bowel movement for a few days.  Please Note:  You might notice some irritation and congestion in your nose or some drainage.  This is from the oxygen used during your procedure.  There is no need for concern and it should clear up in a day or so.  SYMPTOMS TO REPORT IMMEDIATELY:  Following lower endoscopy (colonoscopy or flexible sigmoidoscopy):  Excessive amounts of blood in the stool  Significant tenderness or worsening of abdominal pains  Swelling of the abdomen that is new, acute  Fever of 100F or higher  For urgent or emergent issues, a gastroenterologist can be reached at any hour by calling [phone]. Do not use MyChart messaging for urgent concerns.    DIET:  We do recommend a small meal at first, but then you may proceed to your regular diet.  Drink plenty of fluids but you should avoid alcoholic beverages for 24 hours.  ACTIVITY:  You should plan to take it  easy for the rest of today and you should NOT DRIVE or use heavy machinery until tomorrow (because of the sedation medicines used during the test).    FOLLOW UP: Our staff will call the number listed on your records the next business day following your procedure.  We will call around 7:15- 8:00 am to check on you and address any questions or concerns that you may have regarding the information given to you following your procedure. If we do not reach you, we will leave a message.        SIGNATURES/CONFIDENTIALITY: You and/or your care partner have signed paperwork which will be entered into your electronic medical record.  These signatures attest to the fact that that the information above on your After Visit Summary has been reviewed and is understood.  Full responsibility of the confidentiality of this discharge information lies with you and/or your care-partner.

## 2023-06-03 NOTE — Progress Notes (Signed)
Report to PACU, RN, vss, BBS= Clear.  

## 2023-06-06 ENCOUNTER — Telehealth: Payer: Self-pay | Admitting: *Deleted

## 2023-06-06 NOTE — Telephone Encounter (Signed)
  Follow up Call-     06/03/2023   11:06 AM  Call back number  Post procedure Call Back phone  # [phone]  Permission to leave phone message Yes     Patient questions:  Do you have a fever, pain , or abdominal swelling? No. Pain Score  0 *  Have you tolerated food without any problems? Yes.    Have you been able to return to your normal activities? Yes.    Do you have any questions about your discharge instructions: Diet   No. Medications  No. Follow up visit  No.  Do you have questions or concerns about your Care? No.  Actions: * If pain score is 4 or above: No action needed, pain <4.

## 2023-06-07 ENCOUNTER — Other Ambulatory Visit: Payer: Self-pay | Admitting: Family Medicine

## 2023-06-07 DIAGNOSIS — E559 Vitamin D deficiency, unspecified: Secondary | ICD-10-CM

## 2023-06-07 MED ORDER — VITAMIN D (ERGOCALCIFEROL) 1.25 MG (50000 UNIT) PO CAPS: 50000.0000 [IU] | ORAL_CAPSULE | ORAL | 1 refills | Status: DC

## 2023-06-13 ENCOUNTER — Encounter (HOSPITAL_COMMUNITY): Payer: Self-pay

## 2023-06-13 ENCOUNTER — Ambulatory Visit (HOSPITAL_COMMUNITY)
Admission: EM | Admit: 2023-06-13 | Discharge: 2023-06-13 | Disposition: A | Discharge status: Home / Self Care | Payer: Managed Care, Other (non HMO) | Attending: Family Medicine | Admitting: Family Medicine

## 2023-06-13 DIAGNOSIS — R509 Fever, unspecified: Secondary | ICD-10-CM | POA: Diagnosis not present

## 2023-06-13 DIAGNOSIS — H6123 Impacted cerumen, bilateral: Secondary | ICD-10-CM | POA: Diagnosis not present

## 2023-06-13 DIAGNOSIS — R051 Acute cough: Secondary | ICD-10-CM

## 2023-06-13 DIAGNOSIS — J069 Acute upper respiratory infection, unspecified: Secondary | ICD-10-CM | POA: Diagnosis not present

## 2023-06-13 LAB — POC COVID19/FLU A&B COMBO
Covid Antigen, POC: NEGATIVE
Influenza A Antigen, POC: NEGATIVE
Influenza B Antigen, POC: NEGATIVE

## 2023-06-13 MED ORDER — PROMETHAZINE-DM 6.25-15 MG/5ML PO SYRP: 5.0000 mL | ORAL_SOLUTION | Freq: Four times a day (QID) | ORAL | 0 refills | Status: DC | PRN

## 2023-06-13 NOTE — Discharge Instructions (Addendum)
Take Acetaminophen/Tylenol or Ibuprofen/Advil (as directed on the package) as needed for fever, headache or body aches.  Use OTC Mucinex DM or Zyrtec/Cetirizine for cough and cold symptoms.  Will add Promethazine cough syrup.  Flu and Covid Testing was negative.    Return if symptoms do not improve, worsen or if new symptoms occur.

## 2023-06-13 NOTE — ED Provider Notes (Signed)
[facility]    CSN: [id number] Arrival date & time: 06/13/23  1052      History   Chief Complaint Chief Complaint  Patient presents with   Cough    HPI [name] is a 57 y.o. female.   Presents with cough, mild chest soreness, mild sore throat, headache and fever for 24 hours.  A coworker was sick all last week but worked and did not ever get evaluated.  Denies nausea, vomiting, diarrhea, nor constipation.  Reports she feels that her chest is mostly clear and that her cough is more in her head and neck not in her deep chest.   Cough Associated symptoms: chest pain (mild and noted when she coughs a lot), chills, fever, rhinorrhea and sore throat   Associated symptoms: no shortness of breath and no wheezing     Past Medical History:  Diagnosis Date   Hypertension    Migraines    Prediabetes     Patient Active Problem List   Diagnosis Date Noted   Sleep disturbance 05/30/2023   Nonintractable episodic headache 01/07/2023   Prediabetes 07/06/2022   Anxiety and depression 03/29/2022   History of laparoscopic appendectomy 02/19/2021   Elevated serum hCG 02/09/2021   Situational anxiety 12/21/2019   Migraines 11/20/2019   Hypertension 11/20/2019   Cough 11/20/2019   Tachycardia 11/20/2019    Past Surgical History:  Procedure Laterality Date   ABDOMINAL HYSTERECTOMY     APPENDECTOMY     CHOLECYSTECTOMY     CYST EXCISION     LAPAROSCOPIC APPENDECTOMY N/A 02/19/2021   Procedure: APPENDECTOMY LAPAROSCOPIC;  Surgeon: Manus Rudd, MD;  Location: [facility] OR;  Service: General;  Laterality: N/A;   LAPAROSCOPY  02/19/2021   Procedure: LAPAROSCOPY DIAGNOSTIC;  Surgeon: Manus Rudd, MD;  Location: [facility] OR;  Service: General;;   LYSIS OF ADHESION  02/19/2021   Procedure: LYSIS OF ADHESION;  Surgeon: Manus Rudd, MD;  Location: [facility] OR;  Service: General;;    OB History   No obstetric history on file.      Home Medications    Prior to Admission  medications   Medication Sig Start Date End Date Taking? Authorizing Provider  promethazine-dextromethorphan (PROMETHAZINE-DM) 6.25-15 MG/5ML syrup Take 5 mLs by mouth 4 (four) times daily as needed for cough. 06/13/23  Yes Prescilla Sours, FNP  atenolol (TENORMIN) 100 MG tablet Take 1 tablet by mouth once daily 04/04/23   Gilmore Laroche, FNP  baclofen (LIORESAL) 10 MG tablet Take 1 tablet (10 mg total) by mouth 2 (two) times daily as needed for muscle spasms. 01/24/23   Raspet, Noberto Retort, PA-C  hydrOXYzine (VISTARIL) 25 MG capsule Take 1 capsule (25 mg total) by mouth at bedtime as needed. 05/30/23   Gilmore Laroche, FNP  olmesartan (BENICAR) 40 MG tablet Take 1 tablet by mouth once daily 03/31/23   Gilmore Laroche, FNP  PARoxetine (PAXIL) 10 MG tablet Take 1 tablet by mouth once daily 01/04/22   Champ Mungo, DO  rizatriptan (MAXALT) 10 MG tablet Take 1 tablet (10 mg total) by mouth as needed for migraine. May repeat in 2 hours if needed.  Maximum 2 tablets in 24 hours. 02/11/23   Drema Dallas, DO  SUMAtriptan (IMITREX) 100 MG tablet Take 1 tablet (100 mg total) by mouth every 2 (two) hours as needed for migraine. May repeat in 2 hours if headache persists or recurs. 01/03/23   Gilmore Laroche, FNP  topiramate (TOPAMAX) 25 MG tablet Take 1 tablet (25 mg  total) by mouth at bedtime. 02/11/23   Drema Dallas, DO  Vitamin D, Ergocalciferol, (DRISDOL) 1.25 MG (50000 UNIT) CAPS capsule Take 1 capsule (50,000 Units total) by mouth every 7 (seven) days. 06/07/23   Gilmore Laroche, FNP    Family History Family History  Problem Relation Age of Onset   Hypertension Mother    Hypertension Father    Colon cancer Neg Hx    Colon polyps Neg Hx    Esophageal cancer Neg Hx    Stomach cancer Neg Hx    Rectal cancer Neg Hx     Social History Social History   Tobacco Use   Smoking status: Never   Smokeless tobacco: Never  Vaping Use   Vaping status: Never Used  Substance Use Topics   Alcohol use: No   Drug  use: No     Allergies   Codeine   Review of Systems Review of Systems  Constitutional:  Positive for chills, fatigue and fever.  HENT:  Positive for rhinorrhea and sore throat. Negative for sinus pressure and sinus pain.   Eyes: Negative.   Respiratory:  Positive for cough. Negative for chest tightness, shortness of breath and wheezing.   Cardiovascular:  Positive for chest pain (mild and noted when she coughs a lot). Negative for palpitations.  Gastrointestinal:  Negative for abdominal pain, constipation, diarrhea, nausea and vomiting.  Genitourinary:  Negative for dysuria.  Musculoskeletal:  Negative for arthralgias.  Neurological: Negative.   Hematological:  Negative for adenopathy.     Physical Exam Triage Vital Signs ED Triage Vitals  Encounter Vitals Group     BP 06/13/23 1133 130/77     Systolic BP Percentile --      Diastolic BP Percentile --      Pulse Rate 06/13/23 1133 91     Resp 06/13/23 1133 18     Temp 06/13/23 1133 99.6 F (37.6 C)     Temp Source 06/13/23 1133 Oral     SpO2 06/13/23 1133 97 %     Weight --      Height --      Head Circumference --      Peak Flow --      Pain Score 06/13/23 1134 3     Pain Loc --      Pain Education --      Exclude from Growth Chart --    No data found.  Updated Vital Signs BP 130/77 (BP Location: Left Arm)   Pulse 91   Temp 99.6 F (37.6 C) (Oral)   Resp 18   SpO2 97%   Visual Acuity Right Eye Distance:   Left Eye Distance:   Bilateral Distance:    Right Eye Near:   Left Eye Near:    Bilateral Near:     Physical Exam Constitutional:      Appearance: She is ill-appearing. She is not toxic-appearing.  HENT:     Head: Normocephalic.     Right Ear: Hearing normal. There is impacted cerumen (unable to see the TM).     Left Ear: Hearing normal. There is impacted cerumen (unable to see the TM).     Nose: Congestion and rhinorrhea present. No mucosal edema. Rhinorrhea is clear.     Right Turbinates:  Swollen.     Left Turbinates: Swollen.     Right Sinus: No maxillary sinus tenderness or frontal sinus tenderness.     Left Sinus: No maxillary sinus tenderness or frontal sinus tenderness.  Mouth/Throat:     Mouth: Mucous membranes are moist.     Pharynx: Uvula midline. No oropharyngeal exudate or posterior oropharyngeal erythema.     Tonsils: No tonsillar exudate or tonsillar abscesses.  Eyes:     General: Lids are normal. Vision grossly intact.     Conjunctiva/sclera: Conjunctivae normal.     Pupils: Pupils are equal, round, and reactive to light.  Cardiovascular:     Rate and Rhythm: Normal rate and regular rhythm.     Heart sounds: Normal heart sounds, S1 normal and S2 normal. No murmur heard. Pulmonary:     Effort: Pulmonary effort is normal.     Breath sounds: Normal breath sounds. No decreased breath sounds, wheezing, rhonchi or rales.  Musculoskeletal:     Cervical back: Normal range of motion and neck supple.  Lymphadenopathy:     Head:     Right side of head: Submandibular and posterior auricular adenopathy present.     Left side of head: Submandibular and posterior auricular adenopathy present.     Cervical: Cervical adenopathy present.     Right cervical: Superficial cervical adenopathy present.     Left cervical: Superficial cervical adenopathy present.  Skin:    General: Skin is warm and dry.     Findings: No rash.  Neurological:     Mental Status: She is alert and oriented to person, place, and time.     Gait: Gait normal.  Psychiatric:        Mood and Affect: Mood normal.        Behavior: Behavior normal.      UC Treatments / Results  Labs (all labs ordered are listed, but only abnormal results are displayed) Labs Reviewed  POC COVID19/FLU A&B COMBO    EKG   Radiology No results found.  Procedures Procedures (including critical care time)  Medications Ordered in UC Medications - No data to display  Initial Impression / Assessment and Plan  / UC Course  I have reviewed the triage vital signs and the nursing notes.  Pertinent labs & imaging results that were available during my care of the patient were reviewed by me and considered in my medical decision making (see chart for details).  Viral URI with cough and fever: Educated about upper respiratory infection and fever with handouts given.  Encouraged to take Tylenol or acetaminophen as needed for fever headache or bodyaches.  Encouraged to use over-the-counter Mucinex or Zyrtec for cough and cold symptoms.  Added promethazine cough syrup 5 mL every 6 hours as needed for cough.  Flu and COVID testing was negative.  Follow-up if symptoms do not improve, if symptoms worsen, or if new symptoms occur  Bilateral impacted cerumen: Educated on the finding of ear blockage with wax during her exam.  Educated on how to clean her ears at home.  If unable to get ears cleaned may return here for ear lavage. Final Clinical Impressions(s) / UC Diagnoses   Final diagnoses:  Fever, unspecified  Viral URI with cough  Bilateral impacted cerumen     Discharge Instructions      Take Acetaminophen/Tylenol or Ibuprofen/Advil (as directed on the package) as needed for fever, headache or body aches.  Use OTC Mucinex DM or Zyrtec/Cetirizine for cough and cold symptoms.  Will add Promethazine cough syrup.  Flu and Covid Testing was negative.    Return if symptoms do not improve, worsen or if new symptoms occur.       ED Prescriptions  Medication Sig Dispense Auth. Provider   promethazine-dextromethorphan (PROMETHAZINE-DM) 6.25-15 MG/5ML syrup Take 5 mLs by mouth 4 (four) times daily as needed for cough. 118 mL Prescilla Sours, FNP      PDMP not reviewed this encounter.   Prescilla Sours, FNP 06/13/23 1308

## 2023-06-13 NOTE — ED Triage Notes (Signed)
Pt c/o cough, chest soreness, sore throat, and headache x24hrs and denies taking meds for sx's.

## 2023-06-25 ENCOUNTER — Other Ambulatory Visit: Payer: Self-pay | Admitting: Family Medicine

## 2023-06-25 DIAGNOSIS — F418 Other specified anxiety disorders: Secondary | ICD-10-CM

## 2023-06-30 ENCOUNTER — Other Ambulatory Visit: Payer: Self-pay | Admitting: Family Medicine

## 2023-06-30 DIAGNOSIS — I10 Essential (primary) hypertension: Secondary | ICD-10-CM

## 2023-06-30 DIAGNOSIS — F418 Other specified anxiety disorders: Secondary | ICD-10-CM

## 2023-07-04 ENCOUNTER — Other Ambulatory Visit: Payer: Self-pay | Admitting: Family Medicine

## 2023-07-04 DIAGNOSIS — F418 Other specified anxiety disorders: Secondary | ICD-10-CM

## 2023-07-04 MED ORDER — ATENOLOL 100 MG PO TABS
100.0000 mg | ORAL_TABLET | Freq: Every day | ORAL | 0 refills | Status: DC
Start: 2023-07-04 — End: 2024-03-01

## 2023-07-08 ENCOUNTER — Encounter: Payer: Self-pay | Admitting: Neurology

## 2023-07-08 ENCOUNTER — Other Ambulatory Visit: Payer: Self-pay

## 2023-07-08 MED ORDER — TOPIRAMATE 25 MG PO TABS: 25.0000 mg | ORAL_TABLET | Freq: Every day | ORAL | 2 refills | Status: DC

## 2023-07-11 ENCOUNTER — Other Ambulatory Visit: Payer: Self-pay | Admitting: Neurology

## 2023-07-11 ENCOUNTER — Other Ambulatory Visit: Payer: Self-pay

## 2023-07-11 MED ORDER — TOPIRAMATE 25 MG PO TABS: 25.0000 mg | ORAL_TABLET | Freq: Every day | ORAL | 2 refills | Status: DC

## 2023-08-10 ENCOUNTER — Encounter (HOSPITAL_COMMUNITY): Payer: Self-pay | Admitting: *Deleted

## 2023-08-10 ENCOUNTER — Ambulatory Visit (HOSPITAL_COMMUNITY)
Admission: EM | Admit: 2023-08-10 | Discharge: 2023-08-10 | Disposition: A | Discharge status: Home / Self Care | Payer: Managed Care, Other (non HMO) | Attending: Internal Medicine | Admitting: Internal Medicine

## 2023-08-10 ENCOUNTER — Other Ambulatory Visit: Payer: Self-pay

## 2023-08-10 DIAGNOSIS — J069 Acute upper respiratory infection, unspecified: Secondary | ICD-10-CM | POA: Diagnosis not present

## 2023-08-10 LAB — POC COVID19/FLU A&B COMBO
Covid Antigen, POC: NEGATIVE
Influenza A Antigen, POC: NEGATIVE
Influenza B Antigen, POC: NEGATIVE

## 2023-08-10 NOTE — ED Provider Notes (Signed)
[facility]    CSN: [id number] Arrival date & time: 08/10/23  1534      History   Chief Complaint Chief Complaint  Patient presents with   Headache   Generalized Body Aches    HPI [name] is a 58 y.o. female who presents with onset of HA and body aches today. Has mild cough and post nasal drainage. She has not checked her temp. Has been chilling. Denies sweats.     Past Medical History:  Diagnosis Date   Hypertension    Migraines    Prediabetes     Patient Active Problem List   Diagnosis Date Noted   Sleep disturbance 05/30/2023   Nonintractable episodic headache 01/07/2023   Prediabetes 07/06/2022   Anxiety and depression 03/29/2022   History of laparoscopic appendectomy 02/19/2021   Elevated serum hCG 02/09/2021   Situational anxiety 12/21/2019   Migraines 11/20/2019   Hypertension 11/20/2019   Cough 11/20/2019   Tachycardia 11/20/2019    Past Surgical History:  Procedure Laterality Date   ABDOMINAL HYSTERECTOMY     APPENDECTOMY     CHOLECYSTECTOMY     CYST EXCISION     LAPAROSCOPIC APPENDECTOMY N/A 02/19/2021   Procedure: APPENDECTOMY LAPAROSCOPIC;  Surgeon: Manus Rudd, MD;  Location: [facility] OR;  Service: General;  Laterality: N/A;   LAPAROSCOPY  02/19/2021   Procedure: LAPAROSCOPY DIAGNOSTIC;  Surgeon: Manus Rudd, MD;  Location: [facility] OR;  Service: General;;   LYSIS OF ADHESION  02/19/2021   Procedure: LYSIS OF ADHESION;  Surgeon: Manus Rudd, MD;  Location: [facility] OR;  Service: General;;    OB History   No obstetric history on file.      Home Medications    Prior to Admission medications   Medication Sig Start Date End Date Taking? Authorizing Provider  atenolol (TENORMIN) 100 MG tablet Take 1 tablet (100 mg total) by mouth daily. 07/04/23  Yes Gilmore Laroche, FNP  baclofen (LIORESAL) 10 MG tablet Take 1 tablet (10 mg total) by mouth 2 (two) times daily as needed for muscle spasms. 01/24/23  Yes Raspet, Erin K, PA-C   hydrOXYzine (VISTARIL) 25 MG capsule TAKE 1 CAPSULE BY MOUTH AT BEDTIME AS NEEDED 06/27/23  Yes Gilmore Laroche, FNP  olmesartan (BENICAR) 40 MG tablet Take 1 tablet by mouth once daily 06/30/23  Yes Gilmore Laroche, FNP  rizatriptan (MAXALT) 10 MG tablet Take 1 tablet (10 mg total) by mouth as needed for migraine. May repeat in 2 hours if needed.  Maximum 2 tablets in 24 hours. 02/11/23  Yes Jaffe, Adam R, DO  topiramate (TOPAMAX) 25 MG tablet Take 1 tablet (25 mg total) by mouth at bedtime. 07/11/23  Yes Jaffe, Adam R, DO  Vitamin D, Ergocalciferol, (DRISDOL) 1.25 MG (50000 UNIT) CAPS capsule Take 1 capsule (50,000 Units total) by mouth every 7 (seven) days. 06/07/23  Yes Gilmore Laroche, FNP  atenolol (TENORMIN) 100 MG tablet Take 1 tablet by mouth once daily 07/04/23   Gilmore Laroche, FNP  PARoxetine (PAXIL) 10 MG tablet Take 1 tablet by mouth once daily 01/04/22   Champ Mungo, DO  promethazine-dextromethorphan (PROMETHAZINE-DM) 6.25-15 MG/5ML syrup Take 5 mLs by mouth 4 (four) times daily as needed for cough. 06/13/23   Prescilla Sours, FNP  SUMAtriptan (IMITREX) 100 MG tablet Take 1 tablet (100 mg total) by mouth every 2 (two) hours as needed for migraine. May repeat in 2 hours if headache persists or recurs. 01/03/23   Gilmore Laroche, FNP    Family History Family  History  Problem Relation Age of Onset   Hypertension Mother    Hypertension Father    Colon cancer Neg Hx    Colon polyps Neg Hx    Esophageal cancer Neg Hx    Stomach cancer Neg Hx    Rectal cancer Neg Hx     Social History Social History   Tobacco Use   Smoking status: Never   Smokeless tobacco: Never  Vaping Use   Vaping status: Never Used  Substance Use Topics   Alcohol use: No   Drug use: No     Allergies   Codeine   Review of Systems Review of Systems As noted  in HPI  Physical Exam Triage Vital Signs ED Triage Vitals  Encounter Vitals Group     BP 08/10/23 1555 124/75     Systolic BP Percentile --       Diastolic BP Percentile --      Pulse Rate 08/10/23 1555 76     Resp 08/10/23 1555 18     Temp 08/10/23 1555 98.4 F (36.9 C)     Temp src --      SpO2 08/10/23 1555 99 %     Weight --      Height --      Head Circumference --      Peak Flow --      Pain Score 08/10/23 1551 6     Pain Loc --      Pain Education --      Exclude from Growth Chart --    No data found.  Updated Vital Signs BP 124/75   Pulse 76   Temp 98.4 F (36.9 C)   Resp 18   SpO2 99%   Visual Acuity Right Eye Distance:   Left Eye Distance:   Bilateral Distance:    Right Eye Near:   Left Eye Near:    Bilateral Near:     Physical Exam  Physical Exam Vitals signs and nursing note reviewed.  Constitutional:      General: She is not in acute distress.    Appearance: Normal appearance. She is not ill-appearing, toxic-appearing or diaphoretic.  HENT:     Head: Normocephalic.     Right Ear: Tympanic membrane, ear canal and external ear normal.     Left Ear: Tympanic membrane, ear canal and external ear normal.     Nose: Nose normal.     Mouth/Throat:     Mouth: Mucous membranes are moist.  Eyes:     General: No scleral icterus.       Right eye: No discharge.        Left eye: No discharge.     Conjunctiva/sclera: Conjunctivae normal.  Neck:     Musculoskeletal: Neck supple. No neck rigidity.  Cardiovascular:     Rate and Rhythm: Normal rate and regular rhythm.     Heart sounds: No murmur.  Pulmonary:     Effort: Pulmonary effort is normal.     Breath sounds: Normal breath sounds.  Musculoskeletal: Normal range of motion.  Lymphadenopathy:     Cervical: No cervical adenopathy.  Skin:    General: Skin is warm and dry.     Coloration: Skin is not jaundiced.     Findings: No rash.  Neurological:     Mental Status: She is alert and oriented to person, place, and time.     Gait: Gait normal.  Psychiatric:        Mood and Affect:  Mood normal.        Behavior: Behavior normal.         Thought Content: Thought content normal.        Judgment: Judgment normal.   UC Treatments / Results  Labs (all labs ordered are listed, but only abnormal results are displayed) Labs Reviewed  POC COVID19/FLU A&B COMBO   Flu and covid test are negative EKG   Radiology No results found.  Procedures Procedures (including critical care time)  Medications Ordered in UC Medications - No data to display  Initial Impression / Assessment and Plan / UC Course  I have reviewed the triage vital signs and the nursing notes.  Pertinent labs  results that were available during my care of the patient were reviewed by me and considered in my medical decision making (see chart for details).  URI  See see  instructions  Final Clinical Impressions(s) / UC Diagnoses   Final diagnoses:  None   Discharge Instructions   None    ED Prescriptions   None    PDMP not reviewed this encounter.   Garey Ham, PA-C 08/10/23 1630

## 2023-08-10 NOTE — ED Triage Notes (Signed)
Pt reports she has a bad HA and body aches.

## 2023-08-10 NOTE — Discharge Instructions (Addendum)
Your Flu test is negative. If you develop a fever of 100.5 or more and get worse, please come back.   You may take Tylenol or Ibuprofen as needed for the aches.

## 2023-09-03 ENCOUNTER — Ambulatory Visit (HOSPITAL_COMMUNITY)
Admission: EM | Admit: 2023-09-03 | Discharge: 2023-09-03 | Disposition: A | Discharge status: Home / Self Care | Attending: Neurology | Admitting: Neurology

## 2023-09-03 ENCOUNTER — Encounter (HOSPITAL_COMMUNITY): Payer: Self-pay | Admitting: Emergency Medicine

## 2023-09-03 ENCOUNTER — Other Ambulatory Visit: Payer: Self-pay

## 2023-09-03 DIAGNOSIS — H01004 Unspecified blepharitis left upper eyelid: Secondary | ICD-10-CM | POA: Diagnosis not present

## 2023-09-03 DIAGNOSIS — G43011 Migraine without aura, intractable, with status migrainosus: Secondary | ICD-10-CM | POA: Diagnosis not present

## 2023-09-03 MED ORDER — DEXAMETHASONE SODIUM PHOSPHATE 10 MG/ML IJ SOLN
INTRAMUSCULAR | Status: AC
  Filled 2023-09-03: qty 1

## 2023-09-03 MED ORDER — DEXAMETHASONE SODIUM PHOSPHATE 10 MG/ML IJ SOLN
10.0000 mg | Freq: Once | INTRAMUSCULAR | Status: AC
  Administered 2023-09-03: 10 mg via INTRAMUSCULAR

## 2023-09-03 MED ORDER — KETOROLAC TROMETHAMINE 30 MG/ML IJ SOLN
INTRAMUSCULAR | Status: AC
  Filled 2023-09-03: qty 1

## 2023-09-03 MED ORDER — KETOROLAC TROMETHAMINE 30 MG/ML IJ SOLN
30.0000 mg | Freq: Once | INTRAMUSCULAR | Status: AC
  Administered 2023-09-03: 30 mg via INTRAMUSCULAR

## 2023-09-03 MED ORDER — METOCLOPRAMIDE HCL 5 MG/ML IJ SOLN
INTRAMUSCULAR | Status: AC
Start: 2023-09-03 — End: ?
  Filled 2023-09-03: qty 2

## 2023-09-03 MED ORDER — METOCLOPRAMIDE HCL 5 MG/ML IJ SOLN
5.0000 mg | Freq: Once | INTRAMUSCULAR | Status: AC
  Administered 2023-09-03: 5 mg via INTRAMUSCULAR

## 2023-09-03 MED ORDER — ERYTHROMYCIN 5 MG/GM OP OINT: TOPICAL_OINTMENT | OPHTHALMIC | 0 refills | Status: DC

## 2023-09-03 NOTE — ED Triage Notes (Signed)
 Pt reports HA for over a week and left eye swollen and redness.

## 2023-09-03 NOTE — Discharge Instructions (Addendum)
 Continue using warm compress on left eye, clean make-up brushes and repurchase any mascara, eyeliner's, eye shadows that have been used when you initially had your first stye.  Wash pillowcases and towels.  Use antibiotic ointment as prescribed and artificial tears as needed. For migraine, continue using your prescribed migraine medications and follow-up with Dr. Everlena Cooper at your scheduled appointment.  You did receive a migraine cocktail in clinic today so do not use any NSAIDs (ibuprofen, tylenol, excedrin) for the next 24 hours Please return if symptoms do not improve over the next couple of days or follow-up with your PCP.Marland Kitchen

## 2023-09-03 NOTE — ED Provider Notes (Signed)
 [facility]    CSN: [id number] Arrival date & time: 09/03/23  1013      History   Chief Complaint Chief Complaint  Patient presents with   Headache   Eye Problem    HPI [name] is a 58 y.o. female.   Patient presents with a migraine for approximately 3 days.  She has used her rescue medications and Excedrin Migraine yesterday around 4 PM.  This did take the edge off however she has not been able to completely get out of her headache.  She does have a follow-up appointment with Dr. Everlena Cooper in the next couple of weeks.  Additionally she did have a stye in her right eye that did resolve and then she now has swelling in her left eye lid that started Wednesday.  She has used a warm compress for this without much relief.  She does use false eyelashes and she has used a new glue that she believes caused the irritation.  On exam she does have some discharge noted on her left eye. No redness.     Headache Eye Problem Associated symptoms: headaches     Past Medical History:  Diagnosis Date   Hypertension    Migraines    Prediabetes     Patient Active Problem List   Diagnosis Date Noted   Sleep disturbance 05/30/2023   Nonintractable episodic headache 01/07/2023   Prediabetes 07/06/2022   Anxiety and depression 03/29/2022   History of laparoscopic appendectomy 02/19/2021   Elevated serum hCG 02/09/2021   Situational anxiety 12/21/2019   Migraines 11/20/2019   Hypertension 11/20/2019   Cough 11/20/2019   Tachycardia 11/20/2019    Past Surgical History:  Procedure Laterality Date   ABDOMINAL HYSTERECTOMY     APPENDECTOMY     CHOLECYSTECTOMY     CYST EXCISION     LAPAROSCOPIC APPENDECTOMY N/A 02/19/2021   Procedure: APPENDECTOMY LAPAROSCOPIC;  Surgeon: Manus Rudd, MD;  Location: [facility] OR;  Service: General;  Laterality: N/A;   LAPAROSCOPY  02/19/2021   Procedure: LAPAROSCOPY DIAGNOSTIC;  Surgeon: Manus Rudd, MD;  Location: [facility] OR;  Service:  General;;   LYSIS OF ADHESION  02/19/2021   Procedure: LYSIS OF ADHESION;  Surgeon: Manus Rudd, MD;  Location: [facility] OR;  Service: General;;    OB History   No obstetric history on file.      Home Medications    Prior to Admission medications   Medication Sig Start Date End Date Taking? Authorizing Provider  erythromycin ophthalmic ointment Place a 1/2 inch ribbon of ointment into the lower eyelid. 09/03/23  Yes Elmer Picker, NP  atenolol (TENORMIN) 100 MG tablet Take 1 tablet by mouth once daily 07/04/23   Gilmore Laroche, FNP  atenolol (TENORMIN) 100 MG tablet Take 1 tablet (100 mg total) by mouth daily. 07/04/23   Gilmore Laroche, FNP  hydrOXYzine (VISTARIL) 25 MG capsule TAKE 1 CAPSULE BY MOUTH AT BEDTIME AS NEEDED 06/27/23   Gilmore Laroche, FNP  rizatriptan (MAXALT) 10 MG tablet Take 1 tablet (10 mg total) by mouth as needed for migraine. May repeat in 2 hours if needed.  Maximum 2 tablets in 24 hours. 02/11/23   Drema Dallas, DO  SUMAtriptan (IMITREX) 100 MG tablet Take 1 tablet (100 mg total) by mouth every 2 (two) hours as needed for migraine. May repeat in 2 hours if headache persists or recurs. 01/03/23   Gilmore Laroche, FNP  topiramate (TOPAMAX) 25 MG tablet Take 1 tablet (25 mg total) by mouth  at bedtime. 07/11/23   Drema Dallas, DO  Vitamin D, Ergocalciferol, (DRISDOL) 1.25 MG (50000 UNIT) CAPS capsule Take 1 capsule (50,000 Units total) by mouth every 7 (seven) days. 06/07/23   Gilmore Laroche, FNP    Family History Family History  Problem Relation Age of Onset   Hypertension Mother    Hypertension Father    Colon cancer Neg Hx    Colon polyps Neg Hx    Esophageal cancer Neg Hx    Stomach cancer Neg Hx    Rectal cancer Neg Hx     Social History Social History   Tobacco Use   Smoking status: Never   Smokeless tobacco: Never  Vaping Use   Vaping status: Never Used  Substance Use Topics   Alcohol use: No   Drug use: No     Allergies   Codeine   Review  of Systems Review of Systems  Neurological:  Positive for headaches.     Physical Exam Triage Vital Signs ED Triage Vitals [09/03/23 1050]  Encounter Vitals Group     BP 135/85     Systolic BP Percentile      Diastolic BP Percentile      Pulse Rate 85     Resp 18     Temp 98.3 F (36.8 C)     Temp Source Oral     SpO2 100 %     Weight      Height      Head Circumference      Peak Flow      Pain Score 6     Pain Loc      Pain Education      Exclude from Growth Chart    No data found.  Updated Vital Signs BP 135/85 (BP Location: Right Arm)   Pulse 85   Temp 98.3 F (36.8 C) (Oral)   Resp 18   SpO2 100%   Visual Acuity Right Eye Distance:   Left Eye Distance:   Bilateral Distance:    Right Eye Near:   Left Eye Near:    Bilateral Near:     Physical Exam Vitals and nursing note reviewed.  Constitutional:      General: She is not in acute distress.    Appearance: She is well-developed.  HENT:     Head: Normocephalic and atraumatic.  Eyes:     General: Vision grossly intact. Gaze aligned appropriately.        Right eye: No foreign body.        Left eye: Discharge present.No foreign body.     Conjunctiva/sclera: Conjunctivae normal.     Comments: Left eye lid swelling. Small amount of white discharge. No changes or trouble with visual acuity  Cardiovascular:     Rate and Rhythm: Normal rate and regular rhythm.     Heart sounds: No murmur heard. Pulmonary:     Effort: Pulmonary effort is normal. No respiratory distress.     Breath sounds: Normal breath sounds.  Abdominal:     Palpations: Abdomen is soft.     Tenderness: There is no abdominal tenderness.  Musculoskeletal:        General: No swelling.     Cervical back: Neck supple.  Skin:    General: Skin is warm and dry.     Capillary Refill: Capillary refill takes less than 2 seconds.  Neurological:     Mental Status: She is alert.  Psychiatric:        Mood  and Affect: Mood normal.      UC  Treatments / Results  Labs (all labs ordered are listed, but only abnormal results are displayed) Labs Reviewed - No data to display  EKG   Radiology No results found.  Procedures Procedures (including critical care time)  Medications Ordered in UC Medications  ketorolac (TORADOL) 30 MG/ML injection 30 mg (30 mg Intramuscular Given 09/03/23 1122)  metoCLOPramide (REGLAN) injection 5 mg (5 mg Intramuscular Given 09/03/23 1122)  dexamethasone (DECADRON) injection 10 mg (10 mg Intramuscular Given 09/03/23 1122)    Initial Impression / Assessment and Plan / UC Course  I have reviewed the triage vital signs and the nursing notes.  Pertinent labs & imaging results that were available during my care of the patient were reviewed by me and considered in my medical decision making (see chart for details).    Migraine - given a migraine cocktail in clinic, patient verbalized understanding with no NSAIDs for the next 24 hours.  Blepharitis- erythromycin ointment given, continue warm compress. Follow up with ophthalmology if does not improve. Patient will clean makeup brushes and replace make up. No fake eyelashes until swelling/pain resolved.        Final Clinical Impressions(s) / UC Diagnoses   Final diagnoses:  Intractable migraine without aura and with status migrainosus  Blepharitis of left upper eyelid, unspecified type     Discharge Instructions      Continue using warm compress on left eye, clean make-up brushes and repurchase any mascara, eyeliner's, eye shadows that have been used when you initially had your first stye.  Wash pillowcases and towels.  Use antibiotic ointment as prescribed and artificial tears as needed. For migraine, continue using your prescribed migraine medications and follow-up with Dr. Everlena Cooper at your scheduled appointment.  You did receive a migraine cocktail in clinic today so do not use any NSAIDs (ibuprofen, tylenol, excedrin) for the next 24  hours Please return if symptoms do not improve over the next couple of days or follow-up with your PCP.Marland Kitchen      ED Prescriptions     Medication Sig Dispense Auth. Provider   erythromycin ophthalmic ointment Place a 1/2 inch ribbon of ointment into the lower eyelid. 3.5 g Elmer Picker, NP      PDMP not reviewed this encounter.   Elmer Picker, NP 09/03/23 1421

## 2023-09-09 ENCOUNTER — Other Ambulatory Visit: Payer: Self-pay | Admitting: Family Medicine

## 2023-09-09 DIAGNOSIS — F418 Other specified anxiety disorders: Secondary | ICD-10-CM

## 2023-09-09 NOTE — Progress Notes (Signed)
 NEUROLOGY FOLLOW UP OFFICE NOTE  [name] [id number]  Assessment/Plan:   Migraine without aura, with status migrainosus, not intractable  Migraine prevention:  Due to side effects of topiramate (weight loss), will discontinue topiramate.  Plan to start Emgality every 4 weeks.  Already on beta blocker.  Would not use a TCA such as nortriptyline as she already is on multiple antihypertensives with breakthrough HTN.  Would not use Aimovig as she is already on multiple blood pressure medications. Migraine rescue:  Rizatriptan 10mg .  Zofran for nausea Discontinue Goody powder Limit use of pain relievers to no more than 2 days out of week to prevent risk of rebound or medication-overuse headache. Keep headache diary Follow up 6 months.    Subjective:  [name] is a 58 year old left-handed female with HTN and prediabetes who follows up for migraines.  MRI/MRA of head personally reviewed.  UPDATE: 03/27/2023 MRI BRAIN W WO:  1. No acute intracranial abnormality. 2. 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 3. Partially visible cervical disc degeneration at C4-C5 with possible mild spinal stenosis there. 03/27/2023 MRA HEAD:  Normal intracranial MRA  Started topiramate.   Noted improvement for the first 2 months but they have returned to baseline.  Notes some weight loss as well.   Intensity:  8/10 Duration:  45 minutes with Marlin Canary or Excedrin Migraine or rizatriptan Frequency:  15 to 16 days a month Frequency of abortive medication: Takes rizatriptan, Goody or Excedrin 3 days a week.   Current NSAIDS/analgesics:  Tylenol Current triptans:  rizatriptan 10mg  Current ergotamine:  none Current anti-emetic:  none Current muscle relaxants:  none Current Antihypertensive medications:  atenolol 100mg  daily, olmesartan, hydralazine Current Antidepressant  medications:  none Current Anticonvulsant medications:  topiramate 25mg  at bedtime Current anti-CGRP:  none Current Vitamins/Herbal/Supplements:  riboflavin 400mg  Current Antihistamines/Decongestants:  flonase Other therapy:  none   Caffeine:  No coffee or soda Diet:  No coffee or soda.  Drinks at least 64 oz water daily.  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  Switched from third to second shift 3 months ago.  Gets 6 hours of sleep a night.    HISTORY:  She has had headaches since her 41s.  They used to occur 3 to 4 times a month.  They started becoming daily 2 months ago.  No preceding injury, new medication or new stressors.  Sleep schedule has changed as she moved from third shift to second shift 3 months ago.  She describes a diffuse throbbing headache, usually back of head but also in the front, associated with nausea and photophobia.  No vomiting, phonophobia or visual disturbance.  She has a persistent low-grade headache but they become 8/10 daily.  Takes Goody powder daily.  Lasts 1.5 hours with Marlin Canary.  PCP gave her sumatriptan.  Lasts 3-4 hours with sumatriptan.  No known triggers.  Movement aggravates it.  On 12/22/2022, she had a diffuse headache and was seen in the ED due to having a SBP over 200.  CT head personally reviewed was unremarkable.  Labs and EKG showed no acute findings.    Past NSAIDS/analgesics:  ibuprofen, Tylenol Past abortive triptans:  sumatriptan 100mg  Past abortive ergotamine:  none Past muscle relaxants:  baclofen Past anti-emetic:  promethazine, Zofran Past antihypertensive medications:  none Past antidepressant medications:  paroxetine Past anticonvulsant medications:  none Past anti-CGRP:  none Past vitamins/Herbal/Supplements:  none Past antihistamines/decongestants:  none Other past therapies:  none   Family history of migraines:  no  Remote  MRI of brain without contrast on 08/23/2007 personally reviewed was unremarkable.  PAST MEDICAL HISTORY: Past Medical History:  Diagnosis Date   Hypertension    Migraines    Prediabetes     MEDICATIONS: Current Outpatient Medications on File Prior to Visit  Medication Sig Dispense Refill   atenolol (TENORMIN) 100 MG tablet Take 1 tablet by mouth once daily 90 tablet 0   atenolol (TENORMIN) 100 MG tablet Take 1 tablet (100 mg total) by mouth daily. 90 tablet 0   erythromycin ophthalmic ointment Place a 1/2 inch ribbon of ointment into the lower eyelid. 3.5 g 0   hydrOXYzine (VISTARIL) 25 MG capsule TAKE 1 CAPSULE BY MOUTH AT BEDTIME AS NEEDED 30 capsule 0   rizatriptan (MAXALT) 10 MG tablet Take 1 tablet (10 mg total) by mouth as needed for migraine. May repeat in 2 hours if needed.  Maximum 2 tablets in 24 hours. 10 tablet 5   SUMAtriptan (IMITREX) 100 MG tablet Take 1 tablet (100 mg total) by mouth every 2 (two) hours as needed for migraine. May repeat in 2 hours if headache persists or recurs. 10 tablet 0   topiramate (TOPAMAX) 25 MG tablet Take 1 tablet (25 mg total) by mouth at bedtime. 30 tablet 2   Vitamin D, Ergocalciferol, (DRISDOL) 1.25 MG (50000 UNIT) CAPS capsule Take 1 capsule (50,000 Units total) by mouth every 7 (seven) days. 20 capsule 1   No current facility-administered medications on file prior to visit.    ALLERGIES: Allergies  Allergen Reactions   Codeine Itching    FAMILY HISTORY: Family History  Problem Relation Age of Onset   Hypertension Mother    Hypertension Father    Colon cancer Neg Hx    Colon polyps Neg Hx    Esophageal cancer Neg Hx    Stomach cancer Neg Hx    Rectal cancer Neg Hx       Objective:  Blood pressure 116/64, pulse 98, resp. rate 20, weight 131 lb (59.4 kg), SpO2 99%. General: No acute distress.  Patient appears well-groomed.    Shon Millet, DO  CC: Gilmore Laroche, FNP

## 2023-09-12 ENCOUNTER — Ambulatory Visit: Payer: Managed Care, Other (non HMO) | Admitting: Neurology

## 2023-09-12 ENCOUNTER — Other Ambulatory Visit: Payer: Self-pay | Admitting: Family Medicine

## 2023-09-12 ENCOUNTER — Encounter: Payer: Self-pay | Admitting: Neurology

## 2023-09-12 VITALS — BP 116/64 | HR 98 | Resp 20 | Wt 131.0 lb

## 2023-09-12 DIAGNOSIS — F418 Other specified anxiety disorders: Secondary | ICD-10-CM

## 2023-09-12 DIAGNOSIS — G43001 Migraine without aura, not intractable, with status migrainosus: Secondary | ICD-10-CM

## 2023-09-12 MED ORDER — RIZATRIPTAN BENZOATE 10 MG PO TABS: 10.0000 mg | ORAL_TABLET | ORAL | 5 refills | Status: DC | PRN

## 2023-09-12 MED ORDER — EMGALITY 120 MG/ML ~~LOC~~ SOAJ: 240.0000 mg | Freq: Once | SUBCUTANEOUS | 0 refills | Status: AC

## 2023-09-12 MED ORDER — ONDANSETRON 8 MG PO TBDP: 8.0000 mg | ORAL_TABLET | Freq: Three times a day (TID) | ORAL | 0 refills | Status: DC | PRN

## 2023-09-12 NOTE — Patient Instructions (Signed)
 Stop topiramate.  Start Emgality - first dose requires to pens (2 injections).  Contact me when you pick it up and I will then send in a prescription for the standing order of 1 pen (1 injection) every 4 weeks. Take rizatriptan and ondansetron (for nausea) as needed Limit use of pain relievers to no more than 9 days out of the month to prevent risk of rebound or medication-overuse headache. Keep headache diary Follow up 6 months.

## 2023-09-26 ENCOUNTER — Other Ambulatory Visit: Payer: Self-pay | Admitting: Neurology

## 2023-09-26 MED ORDER — EMGALITY 120 MG/ML ~~LOC~~ SOAJ
120.0000 mg | SUBCUTANEOUS | 5 refills | Status: DC
Start: 2023-09-26 — End: 2024-02-14

## 2023-09-30 ENCOUNTER — Other Ambulatory Visit: Payer: Self-pay | Admitting: Family Medicine

## 2023-09-30 ENCOUNTER — Ambulatory Visit: Payer: Managed Care, Other (non HMO) | Admitting: Family Medicine

## 2023-09-30 VITALS — BP 135/80 | HR 85 | Resp 18 | Ht 66.0 in | Wt 131.1 lb

## 2023-09-30 DIAGNOSIS — E559 Vitamin D deficiency, unspecified: Secondary | ICD-10-CM

## 2023-09-30 DIAGNOSIS — Z1231 Encounter for screening mammogram for malignant neoplasm of breast: Secondary | ICD-10-CM

## 2023-09-30 DIAGNOSIS — E038 Other specified hypothyroidism: Secondary | ICD-10-CM

## 2023-09-30 DIAGNOSIS — I10 Essential (primary) hypertension: Secondary | ICD-10-CM

## 2023-09-30 DIAGNOSIS — R7303 Prediabetes: Secondary | ICD-10-CM | POA: Diagnosis not present

## 2023-09-30 DIAGNOSIS — E7849 Other hyperlipidemia: Secondary | ICD-10-CM

## 2023-09-30 DIAGNOSIS — F418 Other specified anxiety disorders: Secondary | ICD-10-CM

## 2023-09-30 DIAGNOSIS — R7301 Impaired fasting glucose: Secondary | ICD-10-CM

## 2023-09-30 MED ORDER — HYDROXYZINE PAMOATE 25 MG PO CAPS
25.0000 mg | ORAL_CAPSULE | Freq: Every evening | ORAL | 0 refills | Status: DC | PRN
Start: 2023-09-30 — End: 2023-11-04

## 2023-09-30 NOTE — Progress Notes (Signed)
 Established Patient Office Visit  Subjective:  Patient ID: [name], female    DOB: July 30, 1965  Age: 58 y.o. MRN: [id number]  CC:  Chief Complaint  Patient presents with   Hypertension    Follow up     HPI [name] is a 59 y.o. female with past medical history of hypertension, prediabetes presents for f/u of  chronic medical conditions.  For the details of today's visit, please refer to the assessment and plan.    The patient has a history of total hysterectomy and does not require a Pap smear.   Past Medical History:  Diagnosis Date   Hypertension    Migraines    Prediabetes     Past Surgical History:  Procedure Laterality Date   ABDOMINAL HYSTERECTOMY     APPENDECTOMY     CHOLECYSTECTOMY     CYST EXCISION     LAPAROSCOPIC APPENDECTOMY N/A 02/19/2021   Procedure: APPENDECTOMY LAPAROSCOPIC;  Surgeon: Dareen Ebbing, MD;  Location: [facility] OR;  Service: General;  Laterality: N/A;   LAPAROSCOPY  02/19/2021   Procedure: LAPAROSCOPY DIAGNOSTIC;  Surgeon: Dareen Ebbing, MD;  Location: [facility] OR;  Service: General;;   LYSIS OF ADHESION  02/19/2021   Procedure: LYSIS OF ADHESION;  Surgeon: Dareen Ebbing, MD;  Location: [facility] OR;  Service: General;;    Family History  Problem Relation Age of Onset   Hypertension Mother    Hypertension Father    Colon cancer Neg Hx    Colon polyps Neg Hx    Esophageal cancer Neg Hx    Stomach cancer Neg Hx    Rectal cancer Neg Hx     Social History   Socioeconomic History   Marital status: Legally Separated    Spouse name: Not on file   Number of children: Not on file   Years of education: Not on file   Highest education level: 12th grade  Occupational History   Not on file  Tobacco Use   Smoking status: Never   Smokeless tobacco: Never  Vaping Use   Vaping status: Never Used  Substance and Sexual Activity   Alcohol use: No   Drug use: No   Sexual activity: Not on file  Other Topics Concern   Not on file   Social History Narrative   Are you right handed or left handed? left   Are you currently employed ? Y   What is your current occupation? [employer]   Do you live at home alone? no   Who lives with you?    What type of home do you live in: 1 story or 2 story? 2       Social Drivers of Corporate investment banker Strain: Low Risk  (09/29/2023)   Overall Financial Resource Strain (CARDIA)    Difficulty of Paying Living Expenses: Not hard at all  Food Insecurity: No Food Insecurity (09/29/2023)   Hunger Vital Sign    Worried About Running Out of Food in the Last Year: Never true    Ran Out of Food in the Last Year: Never true  Transportation Needs: No Transportation Needs (09/29/2023)   PRAPARE - Administrator, Civil Service (Medical): No    Lack of Transportation (Non-Medical): No  Physical Activity: Unknown (05/26/2023)   Exercise Vital Sign    Days of Exercise per Week: 6 days    Minutes of Exercise per Session: Patient declined  Stress: Stress Concern Present (05/26/2023)   [employer]  of Occupational Health - Occupational Stress Questionnaire    Feeling of Stress : Very much  Social Connections: Moderately Isolated (09/29/2023)   Social Connection and Isolation Panel [NHANES]    Frequency of Communication with Friends and Family: More than three times a week    Frequency of Social Gatherings with Friends and Family: Once a week    Attends Religious Services: More than 4 times per year    Active Member of [employer] or Organizations: No    Attends Engineer, structural: Not on file    Marital Status: Separated  Intimate Partner Violence: Unknown (09/24/2021)   Received from [employer], [facility]   HITS    Physically Hurt: Not on file    Insult or Talk Down To: Not on file    Threaten Physical Harm: Not on file    Scream or Curse: Not on file    Outpatient Medications Prior to Visit  Medication Sig Dispense Refill   atenolol (TENORMIN) 100 MG  tablet Take 1 tablet by mouth once daily 90 tablet 0   atenolol (TENORMIN) 100 MG tablet Take 1 tablet (100 mg total) by mouth daily. 90 tablet 0   Galcanezumab-gnlm (EMGALITY) 120 MG/ML SOAJ Inject 120 mg into the skin every 28 (twenty-eight) days. 1.12 mL 5   olmesartan (BENICAR) 40 MG tablet Take 1 tablet by mouth once daily 90 tablet 0   ondansetron (ZOFRAN-ODT) 8 MG disintegrating tablet Take 1 tablet (8 mg total) by mouth every 8 (eight) hours as needed for nausea or vomiting. 20 tablet 0   rizatriptan (MAXALT) 10 MG tablet Take 1 tablet (10 mg total) by mouth as needed for migraine. May repeat in 2 hours if needed.  Maximum 2 tablets in 24 hours. 10 tablet 5   Vitamin D, Ergocalciferol, (DRISDOL) 1.25 MG (50000 UNIT) CAPS capsule Take 1 capsule (50,000 Units total) by mouth every 7 (seven) days. 20 capsule 1   hydrOXYzine (VISTARIL) 25 MG capsule TAKE 1 CAPSULE BY MOUTH AT BEDTIME AS NEEDED 30 capsule 0   erythromycin ophthalmic ointment Place a 1/2 inch ribbon of ointment into the lower eyelid. (Patient not taking: Reported on 09/30/2023) 3.5 g 0   No facility-administered medications prior to visit.    Allergies  Allergen Reactions   Codeine Itching    ROS Review of Systems  Constitutional:  Negative for chills and fever.  Eyes:  Negative for visual disturbance.  Respiratory:  Negative for chest tightness and shortness of breath.   Neurological:  Negative for dizziness and headaches.      Objective:    Physical Exam HENT:     Head: Normocephalic.     Mouth/Throat:     Mouth: Mucous membranes are moist.  Cardiovascular:     Rate and Rhythm: Normal rate.     Heart sounds: Normal heart sounds.  Pulmonary:     Effort: Pulmonary effort is normal.     Breath sounds: Normal breath sounds.  Neurological:     Mental Status: She is alert.     BP 135/80   Pulse 85   Resp 18   Ht 5\' 6"  (1.676 m)   Wt 131 lb 1.9 oz (59.5 kg)   SpO2 97%   BMI 21.16 kg/m  Wt Readings  from Last 3 Encounters:  09/30/23 131 lb 1.9 oz (59.5 kg)  09/12/23 131 lb (59.4 kg)  06/03/23 127 lb (57.6 kg)    Lab Results  Component Value Date   TSH 0.692  09/30/2023   Lab Results  Component Value Date   WBC 6.0 09/30/2023   HGB 11.9 09/30/2023   HCT 37.3 09/30/2023   MCV 95 09/30/2023   PLT 342 09/30/2023   Lab Results  Component Value Date   NA 144 09/30/2023   K 4.4 09/30/2023   CO2 21 09/30/2023   GLUCOSE 85 09/30/2023   BUN 13 09/30/2023   CREATININE 0.80 09/30/2023   BILITOT 0.3 09/30/2023   ALKPHOS 61 09/30/2023   AST 14 09/30/2023   ALT 11 09/30/2023   PROT 6.9 09/30/2023   ALBUMIN 4.2 09/30/2023   CALCIUM 9.4 09/30/2023   ANIONGAP 8 12/22/2022   EGFR 86 09/30/2023   Lab Results  Component Value Date   CHOL 213 (H) 09/30/2023   Lab Results  Component Value Date   HDL 65 09/30/2023   Lab Results  Component Value Date   LDLCALC 133 (H) 09/30/2023   Lab Results  Component Value Date   TRIG 87 09/30/2023   Lab Results  Component Value Date   CHOLHDL 3.3 09/30/2023   Lab Results  Component Value Date   HGBA1C 6.0 (H) 09/30/2023      Assessment & Plan:  Primary hypertension Assessment & Plan: Controlled She takes olmesartan 40 mg daily Patient is asymptomatic Low-sodium diet with increased physical activity encourage BP Readings from Last 3 Encounters:  09/30/23 135/80  09/12/23 116/64  09/03/23 135/85      Prediabetes Assessment & Plan: Lifestyle modifications for prediabetes were discussed, including adopting a heart-healthy diet and increasing physical activity. The patient was encouraged to decrease her intake of high-sugar foods and beverages. She verbalized understanding and is aware of the plan of care.    Breast cancer screening by mammogram -     3D Screening Mammogram, Left and Right  Situational anxiety -     hydrOXYzine Pamoate; Take 1 capsule (25 mg total) by mouth at bedtime as needed.  Dispense: 30 capsule;  Refill: 0  IFG (impaired fasting glucose) -     Hemoglobin A1c  Vitamin D deficiency -     VITAMIN D 25 Hydroxy (Vit-D Deficiency, Fractures)  TSH (thyroid-stimulating hormone deficiency) -     TSH + free T4  Other hyperlipidemia -     Lipid panel -     CMP14+EGFR -     CBC with Differential/Platelet  Note: This chart has been completed using Engineer, civil (consulting) software, and while attempts have been made to ensure accuracy, certain words and phrases may not be transcribed as intended.    Follow-up: Return in about 5 months (around 03/01/2024).   Vernal Rutan, FNP

## 2023-09-30 NOTE — Patient Instructions (Addendum)
 I appreciate the opportunity to provide care to you today!    Follow up:  5 months  Labs: please stop by the lab today to get your blood drawn (CBC, CMP, TSH, Lipid profile, HgA1c, Vit D)   Please schedule mammogram  Here are some foods to avoid or reduce in your diet to help manage cholesterol levels:  Fried Foods:Deep-fried items such as french fries, fried chicken, and fried snacks are high in unhealthy fats and can raise LDL (bad) cholesterol levels. Processed Meats:Foods like bacon, sausage, hot dogs, and deli meats are often high in saturated fat and cholesterol. Full-Fat Dairy Products:Whole milk, full-fat yogurt, butter, cream, and cheese are rich in saturated fats, which can increase cholesterol levels. Baked Goods and Sweets:Pastries, cakes, cookies, and donuts often contain trans fats and added sugars, which can raise LDL cholesterol and lower HDL (good) cholesterol. Red Meat:Beef, lamb, and pork are high in saturated fat. Lean cuts or plant-based protein alternatives are better options. Lard and Shortening:Used in some baked goods, lard and shortening are high in trans fats and should be avoided. Fast Food:Many fast food items are cooked with unhealthy oils and contain high amounts of saturated and trans fats. Processed Snacks:Chips, crackers, and certain microwave popcorns can contain trans fats and high levels of unhealthy oils. Shellfish:While nutritious in other ways, some shellfish like shrimp, lobster, and crab are high in cholesterol. They should be consumed in moderation. Coconut and Palm Oils:these oils are high in saturated fat and can raise cholesterol levels when used in cooking or baking.    Hypoglycemia: Hypoglycemia occurs when blood glucose levels drop below 70 mg/dL. Severe hypoglycemia may lead to confusion, unconsciousness, or seizures if not treated promptly.  Common Causes: Diabetes medications (e.g., insulin, sulfonylureas like glipizide or  glyburide) Delayed or missed meals Excessive physical activity Alcohol consumption without food Certain medical conditions (e.g., adrenal insufficiency, insulinoma)  Symptoms: Shakiness Sweating Dizziness or lightheadedness Hunger Irritability or anxiety Confusion Weakness or fatigue Blurred vision Loss of consciousness (in severe cases)  Treatment: If blood glucose is <70 mg/dL and patient is conscious: Follow the 15-15 rule: Consume 15 grams of fast-acting carbohydrates (e.g., 4 oz juice, glucose tablets, regular soda, 1 tbsp honey or sugar). Recheck blood glucose in 15 minutes. Repeat if still <70 mg/dL.  If unconscious or unable to swallow: Call 911 immediately  Prevention: Eat regular meals and snacks Adjust medications as needed Monitor blood glucose levels frequently Educate on recognizing early symptoms     Please continue to a heart-healthy diet and increase your physical activities. Try to exercise for at least five days a week.    It was a pleasure to see you and I look forward to continuing to work together on your health and well-being. Please do not hesitate to call the office if you need care or have questions about your care.  In case of emergency, please visit the Emergency Department for urgent care, or contact our clinic at [phone] to schedule an appointment. We're here to help you!   Have a wonderful day and week. With Gratitude, Gilmore Laroche MSN, FNP-BC

## 2023-10-01 LAB — CMP14+EGFR
ALT: 11 IU/L (ref 0–32)
AST: 14 IU/L (ref 0–40)
Albumin: 4.2 g/dL (ref 3.8–4.9)
Alkaline Phosphatase: 61 IU/L (ref 44–121)
BUN/Creatinine Ratio: 16 (ref 9–23)
BUN: 13 mg/dL (ref 6–24)
Bilirubin Total: 0.3 mg/dL (ref 0.0–1.2)
CO2: 21 mmol/L (ref 20–29)
Calcium: 9.4 mg/dL (ref 8.7–10.2)
Chloride: 106 mmol/L (ref 96–106)
Creatinine, Ser: 0.8 mg/dL (ref 0.57–1.00)
Globulin, Total: 2.7 g/dL (ref 1.5–4.5)
Glucose: 85 mg/dL (ref 70–99)
Potassium: 4.4 mmol/L (ref 3.5–5.2)
Sodium: 144 mmol/L (ref 134–144)
Total Protein: 6.9 g/dL (ref 6.0–8.5)
eGFR: 86 mL/min/{1.73_m2} (ref >59)

## 2023-10-01 LAB — CBC WITH DIFFERENTIAL/PLATELET
Basophils Absolute: 0.1 10*3/uL (ref 0.0–0.2)
Basos: 1 %
EOS (ABSOLUTE): 0.2 10*3/uL (ref 0.0–0.4)
Eos: 3 %
Hematocrit: 37.3 % (ref 34.0–46.6)
Hemoglobin: 11.9 g/dL (ref 11.1–15.9)
Immature Grans (Abs): 0 10*3/uL (ref 0.0–0.1)
Immature Granulocytes: 0 %
Lymphocytes Absolute: 1.4 10*3/uL (ref 0.7–3.1)
Lymphs: 24 %
MCH: 30.2 pg (ref 26.6–33.0)
MCHC: 31.9 g/dL (ref 31.5–35.7)
MCV: 95 fL (ref 79–97)
Monocytes Absolute: 0.7 10*3/uL (ref 0.1–0.9)
Monocytes: 12 %
Neutrophils Absolute: 3.6 10*3/uL (ref 1.4–7.0)
Neutrophils: 60 %
Platelets: 342 10*3/uL (ref 150–450)
RBC: 3.94 x10E6/uL (ref 3.77–5.28)
RDW: 13.4 % (ref 11.7–15.4)
WBC: 6 10*3/uL (ref 3.4–10.8)

## 2023-10-01 LAB — TSH+FREE T4
Free T4: 1.04 ng/dL (ref 0.82–1.77)
TSH: 0.692 u[IU]/mL (ref 0.450–4.500)

## 2023-10-01 LAB — HEMOGLOBIN A1C
Est. average glucose Bld gHb Est-mCnc: 126 mg/dL
Hgb A1c MFr Bld: 6 % — ABNORMAL HIGH (ref 4.8–5.6)

## 2023-10-01 LAB — LIPID PANEL
Chol/HDL Ratio: 3.3 ratio (ref 0.0–4.4)
Cholesterol, Total: 213 mg/dL — ABNORMAL HIGH (ref 100–199)
HDL: 65 mg/dL (ref >39)
LDL Chol Calc (NIH): 133 mg/dL — ABNORMAL HIGH (ref 0–99)
Triglycerides: 87 mg/dL (ref 0–149)
VLDL Cholesterol Cal: 15 mg/dL (ref 5–40)

## 2023-10-01 LAB — VITAMIN D 25 HYDROXY (VIT D DEFICIENCY, FRACTURES): Vit D, 25-Hydroxy: 45.9 ng/mL (ref 30.0–100.0)

## 2023-10-01 NOTE — Assessment & Plan Note (Signed)
 Controlled She takes olmesartan 40 mg daily Patient is asymptomatic Low-sodium diet with increased physical activity encourage BP Readings from Last 3 Encounters:  09/30/23 135/80  09/12/23 116/64  09/03/23 135/85

## 2023-10-01 NOTE — Assessment & Plan Note (Signed)
 Lifestyle modifications for prediabetes were discussed, including adopting a heart-healthy diet and increasing physical activity. The patient was encouraged to decrease her intake of high-sugar foods and beverages. She verbalized understanding and is aware of the plan of care.

## 2023-10-04 ENCOUNTER — Other Ambulatory Visit: Payer: Self-pay

## 2023-10-04 ENCOUNTER — Encounter (HOSPITAL_BASED_OUTPATIENT_CLINIC_OR_DEPARTMENT_OTHER): Payer: Self-pay | Admitting: Emergency Medicine

## 2023-10-04 ENCOUNTER — Emergency Department (HOSPITAL_BASED_OUTPATIENT_CLINIC_OR_DEPARTMENT_OTHER)
Admission: EM | Admit: 2023-10-04 | Discharge: 2023-10-04 | Disposition: A | Discharge status: Home / Self Care | Attending: Emergency Medicine | Admitting: Emergency Medicine

## 2023-10-04 DIAGNOSIS — R110 Nausea: Secondary | ICD-10-CM | POA: Insufficient documentation

## 2023-10-04 DIAGNOSIS — R519 Headache, unspecified: Secondary | ICD-10-CM | POA: Diagnosis present

## 2023-10-04 DIAGNOSIS — G43901 Migraine, unspecified, not intractable, with status migrainosus: Secondary | ICD-10-CM

## 2023-10-04 DIAGNOSIS — H53149 Visual discomfort, unspecified: Secondary | ICD-10-CM | POA: Diagnosis not present

## 2023-10-04 MED ORDER — SODIUM CHLORIDE 0.9 % IV BOLUS
500.0000 mL | Freq: Once | INTRAVENOUS | Status: AC
  Administered 2023-10-04: 500 mL via INTRAVENOUS

## 2023-10-04 MED ORDER — PROCHLORPERAZINE EDISYLATE 10 MG/2ML IJ SOLN
10.0000 mg | Freq: Once | INTRAMUSCULAR | Status: AC
  Administered 2023-10-04: 10 mg via INTRAVENOUS
  Filled 2023-10-04: qty 2

## 2023-10-04 MED ORDER — DIPHENHYDRAMINE HCL 50 MG/ML IJ SOLN
12.5000 mg | Freq: Once | INTRAMUSCULAR | Status: AC
  Administered 2023-10-04: 12.5 mg via INTRAVENOUS
  Filled 2023-10-04: qty 1

## 2023-10-04 MED ORDER — KETOROLAC TROMETHAMINE 30 MG/ML IJ SOLN
30.0000 mg | Freq: Once | INTRAMUSCULAR | Status: AC
  Administered 2023-10-04: 30 mg via INTRAVENOUS
  Filled 2023-10-04: qty 1

## 2023-10-04 MED ORDER — DEXAMETHASONE SODIUM PHOSPHATE 10 MG/ML IJ SOLN
10.0000 mg | Freq: Once | INTRAMUSCULAR | Status: AC
  Administered 2023-10-04: 10 mg via INTRAVENOUS
  Filled 2023-10-04: qty 1

## 2023-10-04 MED ORDER — MAGNESIUM SULFATE 2 GM/50ML IV SOLN
2.0000 g | Freq: Once | INTRAVENOUS | Status: AC
  Administered 2023-10-04: 2 g via INTRAVENOUS
  Filled 2023-10-04: qty 50

## 2023-10-04 NOTE — Discharge Instructions (Signed)
Contact a health care provider if: You have symptoms that are different or worse than your usual migraine headache symptoms. You have more than 15 days of headaches in one month. Get help right away if: Your migraine headache becomes severe or lasts more than 72 hours. You have a fever or stiff neck. You have vision loss. Your muscles feel weak or like you cannot control them. You lose your balance often or have trouble walking. You faint. You have a seizure 

## 2023-10-04 NOTE — ED Triage Notes (Signed)
 Headache started yesterday Built up and getting worse Some nausea Hx of migraine  Has had injections for pain

## 2023-10-04 NOTE — ED Provider Notes (Signed)
 [place] EMERGENCY DEPARTMENT AT [facility] Provider Note   CSN: [id number] Arrival date & time: 10/04/23  1832     History  Chief Complaint  Patient presents with   Headache    [name] is a 58 y.o. female.  The history is provided by the patient. No language interpreter was used.  Headache Pain location:  Frontal Radiates to:  Does not radiate Severity currently:  8/10 Severity at highest:  8/10 Duration:  2 days Timing:  Constant Progression:  Unchanged Chronicity:  Recurrent Relieved by:  Nothing Worsened by:  Light and sound Ineffective treatments:  Prescription medications Associated symptoms: nausea, photophobia and sinus pressure   Associated symptoms: no abdominal pain, no back pain, no blurred vision, no congestion, no cough, no diarrhea, no dizziness, no drainage, no ear pain, no eye pain, no facial pain, no fatigue, no fever, no focal weakness, no hearing loss, no loss of balance, no myalgias, no near-syncope, no neck pain, no neck stiffness, no numbness, no paresthesias, no seizures, no sore throat, no swollen glands, no syncope, no tingling, no URI, no visual change and no weakness        Home Medications Prior to Admission medications   Medication Sig Start Date End Date Taking? Authorizing Provider  atenolol  (TENORMIN ) 100 MG tablet Take 1 tablet by mouth once daily 07/04/23   Zarwolo, Gloria, FNP  atenolol  (TENORMIN ) 100 MG tablet Take 1 tablet (100 mg total) by mouth daily. 07/04/23   Zarwolo, Gloria, FNP  erythromycin  ophthalmic ointment Place a 1/2 inch ribbon of ointment into the lower eyelid. Patient not taking: Reported on 09/30/2023 09/03/23   Imogene Mana, NP  Galcanezumab -gnlm (EMGALITY ) 120 MG/ML SOAJ Inject 120 mg into the skin every 28 (twenty-eight) days. 09/26/23   Merriam Abbey, DO  hydrOXYzine  (VISTARIL ) 25 MG capsule Take 1 capsule (25 mg total) by mouth at bedtime as needed. 09/30/23   Zarwolo, Gloria, FNP  olmesartan   (BENICAR ) 40 MG tablet Take 1 tablet by mouth once daily 09/30/23   Zarwolo, Gloria, FNP  ondansetron  (ZOFRAN -ODT) 8 MG disintegrating tablet Take 1 tablet (8 mg total) by mouth every 8 (eight) hours as needed for nausea or vomiting. 09/12/23   Merriam Abbey, DO  rizatriptan  (MAXALT ) 10 MG tablet Take 1 tablet (10 mg total) by mouth as needed for migraine. May repeat in 2 hours if needed.  Maximum 2 tablets in 24 hours. 09/12/23   Merriam Abbey, DO  Vitamin D , Ergocalciferol , (DRISDOL ) 1.25 MG (50000 UNIT) CAPS capsule Take 1 capsule (50,000 Units total) by mouth every 7 (seven) days. 06/07/23   Zarwolo, Gloria, FNP      Allergies    Codeine    Review of Systems   Review of Systems  Constitutional:  Negative for fatigue and fever.  HENT:  Positive for sinus pressure. Negative for congestion, ear pain, hearing loss, postnasal drip and sore throat.   Eyes:  Positive for photophobia. Negative for blurred vision and pain.  Respiratory:  Negative for cough.   Cardiovascular:  Negative for syncope and near-syncope.  Gastrointestinal:  Positive for nausea. Negative for abdominal pain and diarrhea.  Musculoskeletal:  Negative for back pain, myalgias, neck pain and neck stiffness.  Neurological:  Positive for headaches. Negative for dizziness, focal weakness, seizures, weakness, numbness, paresthesias and loss of balance.    Physical Exam Updated Vital Signs BP (!) 157/88   Pulse 100   Temp 98.4 F (36.9 C) (Oral)   Resp 18  SpO2 100%  Physical Exam Physical Exam  Constitutional: Pt is oriented to person, place, and time. Pt appears well-developed and well-nourished. No distress.  HENT:  Head: Normocephalic and atraumatic.  Mouth/Throat: Oropharynx is clear and moist.  Eyes: Conjunctivae and EOM are normal. Pupils are equal, round, and reactive to light. No scleral icterus.  No horizontal, vertical or rotational nystagmus  Neck: Normal range of motion. Neck supple.  Full active and passive  ROM without pain No midline or paraspinal tenderness No nuchal rigidity or meningeal signs  Cardiovascular: Normal rate, regular rhythm and intact distal pulses.   Pulmonary/Chest: Effort normal and breath sounds normal. No respiratory distress. Pt has no wheezes. No rales.  Abdominal: Soft. Bowel sounds are normal. There is no tenderness. There is no rebound and no guarding.  Musculoskeletal: Normal range of motion.  Lymphadenopathy:    No cervical adenopathy.  Neurological: Pt. is alert and oriented to person, place, and time. He has normal reflexes. No cranial nerve deficit.  Exhibits normal muscle tone. Coordination normal.  Mental Status:  Alert, oriented, thought content appropriate. Speech fluent without evidence of aphasia. Able to follow 2 step commands without difficulty.  Cranial Nerves:  II:  Peripheral visual fields grossly normal, pupils equal, round, reactive to light III,IV, VI: ptosis not present, extra-ocular motions intact bilaterally  V,VII: smile symmetric, facial light touch sensation equal VIII: hearing grossly normal bilaterally  IX,X: midline uvula rise  XI: bilateral shoulder shrug equal and strong XII: midline tongue extension  Motor:  5/5 in upper and lower extremities bilaterally including strong and equal grip strength and dorsiflexion/plantar flexion Sensory: Pinprick and light touch normal in all extremities.  Deep Tendon Reflexes: 2+ and symmetric  Cerebellar: normal finger-to-nose with bilateral upper extremities Gait: normal gait and balance CV: distal pulses palpable throughout   Skin: Skin is warm and dry. No rash noted. Pt is not diaphoretic.  Psychiatric: Pt has a normal mood and affect. Behavior is normal. Judgment and thought content normal.  Nursing note and vitals reviewed.  ED Results / Procedures / Treatments   Labs (all labs ordered are listed, but only abnormal results are displayed) Labs Reviewed - No data to  display  EKG None  Radiology No results found.  Procedures Procedures    Medications Ordered in ED Medications - No data to display  ED Course/ Medical Decision Making/ A&P                                 Medical Decision Making Risk Prescription drug management.    [name] presents with headache Given the large differential diagnosis for [name], the decision making in this case is of high complexity.  After evaluating all of the data points in this case, the presentation of [name] is NOT consistent with skull fracture, meningitis/encephalitis, SAH/sentinel bleed, Intracranial Hemorrhage (ICH) (subdural/epidural), acute obstructive hydrocephalus, space occupying lesions, CVA, CO Poisoning, Basilar/vertebral artery dissection, preeclampsia, cerebral venous thrombosis, hypertensive emergency, temporal Arteritis, Idiopathic Intracranial Hypertension (pseudotumor cerebri).  Strict return and follow-up precautions have been given by me personally or by detailed written instructions verbalized by nursing staff using the teach back method to patient/family/caregiver.  Data Reviewed/Counseling: I have reviewed the patient's vital signs, nursing notes, and other relevant tests/information. I had a detailed discussion regarding the historical points, exam findings, and any diagnostic results supporting the discharge diagnosis. I also discussed the need for  outpatient follow-up and the need to return to the ED if symptoms worsen or if there are any questions or concerns that arise at [facility]          Final Clinical Impression(s) / ED Diagnoses Final diagnoses:  None    Rx / DC Orders ED Discharge Orders     None         Annette Fails, PA-C 10/07/23 0943    Sallyanne Creamer, DO 10/12/23 0045

## 2023-10-06 ENCOUNTER — Other Ambulatory Visit: Payer: Self-pay

## 2023-10-06 DIAGNOSIS — F418 Other specified anxiety disorders: Secondary | ICD-10-CM

## 2023-10-06 MED ORDER — ATENOLOL 100 MG PO TABS: 100.0000 mg | ORAL_TABLET | Freq: Every day | ORAL | 0 refills | Status: DC

## 2023-10-12 ENCOUNTER — Other Ambulatory Visit: Payer: Self-pay

## 2023-10-12 MED ORDER — RIZATRIPTAN BENZOATE 10 MG PO TABS: 10.0000 mg | ORAL_TABLET | ORAL | 2 refills | Status: DC | PRN

## 2023-10-13 ENCOUNTER — Other Ambulatory Visit: Payer: Self-pay | Admitting: Family Medicine

## 2023-10-13 DIAGNOSIS — E559 Vitamin D deficiency, unspecified: Secondary | ICD-10-CM

## 2023-10-13 DIAGNOSIS — I10 Essential (primary) hypertension: Secondary | ICD-10-CM

## 2023-10-13 MED ORDER — VITAMIN D (ERGOCALCIFEROL) 1.25 MG (50000 UNIT) PO CAPS
50000.0000 [IU] | ORAL_CAPSULE | ORAL | 1 refills | Status: DC
Start: 2023-10-13 — End: 2023-10-18

## 2023-10-13 MED ORDER — OLMESARTAN MEDOXOMIL 40 MG PO TABS: 40.0000 mg | ORAL_TABLET | Freq: Every day | ORAL | 0 refills | Status: DC

## 2023-10-13 NOTE — Telephone Encounter (Signed)
 Copied from CRM [id number]. Topic: Clinical - Medication Refill >> Oct 13, 2023  2:41 PM Rennis Case wrote: Most Recent Primary Care Visit:  Provider: Junnie Olives  Department: [facility]  Visit Type: OFFICE VISIT  Date: 09/30/2023  Medication: sumatriptan  tabs 100mg  Vitamin D , Ergocalciferol , (DRISDOL ) 1.25 MG (50000 UNIT) CAPS capsule olmesartan  (BENICAR ) 40 MG tablet [[id number]]  Patient requesting 90 day supply w/ 3 refills.   Has the patient contacted their pharmacy? Yes Pharmacy called on behalf of patient.  Is this the correct pharmacy for this prescription? Yes This is the patient's preferred pharmacy:  [facility] - [place], [place] - [address] [place] [address] Phone: [phone] Fax: [phone]   Has the prescription been filled recently? No  Is the patient out of the medication? Unsure if patient is out of medications  Has the patient been seen for an appointment in the last year OR does the patient have an upcoming appointment? Yes  Can we respond through MyChart? Yes  Agent: Please be advised that Rx refills may take up to 3 business days. We ask that you follow-up with your pharmacy.

## 2023-10-17 ENCOUNTER — Ambulatory Visit (HOSPITAL_COMMUNITY)
Admission: RE | Admit: 2023-10-17 | Discharge: 2023-10-17 | Disposition: A | Discharge status: Home / Self Care | Source: Ambulatory Visit | Attending: Family Medicine | Admitting: Family Medicine

## 2023-10-17 ENCOUNTER — Encounter (HOSPITAL_COMMUNITY): Payer: Self-pay

## 2023-10-17 DIAGNOSIS — Z1231 Encounter for screening mammogram for malignant neoplasm of breast: Secondary | ICD-10-CM | POA: Insufficient documentation

## 2023-10-18 ENCOUNTER — Other Ambulatory Visit: Payer: Self-pay

## 2023-10-18 ENCOUNTER — Encounter: Payer: Self-pay | Admitting: Family Medicine

## 2023-10-18 DIAGNOSIS — E559 Vitamin D deficiency, unspecified: Secondary | ICD-10-CM

## 2023-10-18 DIAGNOSIS — I10 Essential (primary) hypertension: Secondary | ICD-10-CM

## 2023-10-18 MED ORDER — OLMESARTAN MEDOXOMIL 40 MG PO TABS: 40.0000 mg | ORAL_TABLET | Freq: Every day | ORAL | 1 refills | Status: DC

## 2023-10-18 MED ORDER — VITAMIN D (ERGOCALCIFEROL) 1.25 MG (50000 UNIT) PO CAPS
50000.0000 [IU] | ORAL_CAPSULE | ORAL | 3 refills | Status: AC
Start: 2023-10-18 — End: ?

## 2023-10-19 ENCOUNTER — Other Ambulatory Visit (HOSPITAL_COMMUNITY): Payer: Self-pay | Admitting: Family Medicine

## 2023-10-19 DIAGNOSIS — R928 Other abnormal and inconclusive findings on diagnostic imaging of breast: Secondary | ICD-10-CM

## 2023-10-25 ENCOUNTER — Ambulatory Visit (HOSPITAL_COMMUNITY)
Admission: RE | Admit: 2023-10-25 | Discharge: 2023-10-25 | Disposition: A | Discharge status: Home / Self Care | Source: Ambulatory Visit | Attending: Family Medicine | Admitting: Family Medicine

## 2023-10-25 DIAGNOSIS — R928 Other abnormal and inconclusive findings on diagnostic imaging of breast: Secondary | ICD-10-CM | POA: Insufficient documentation

## 2023-11-04 ENCOUNTER — Other Ambulatory Visit: Payer: Self-pay | Admitting: Family Medicine

## 2023-11-04 DIAGNOSIS — F418 Other specified anxiety disorders: Secondary | ICD-10-CM

## 2023-11-10 ENCOUNTER — Ambulatory Visit (HOSPITAL_COMMUNITY)
Admission: EM | Admit: 2023-11-10 | Discharge: 2023-11-10 | Disposition: A | Discharge status: Home / Self Care | Attending: Family Medicine | Admitting: Family Medicine

## 2023-11-10 ENCOUNTER — Encounter (HOSPITAL_COMMUNITY): Payer: Self-pay

## 2023-11-10 DIAGNOSIS — S161XXA Strain of muscle, fascia and tendon at neck level, initial encounter: Secondary | ICD-10-CM

## 2023-11-10 DIAGNOSIS — R519 Headache, unspecified: Secondary | ICD-10-CM | POA: Diagnosis not present

## 2023-11-10 MED ORDER — DICLOFENAC SODIUM 75 MG PO TBEC: 75.0000 mg | DELAYED_RELEASE_TABLET | Freq: Two times a day (BID) | ORAL | 0 refills | Status: DC

## 2023-11-10 MED ORDER — CYCLOBENZAPRINE HCL 10 MG PO TABS: ORAL_TABLET | ORAL | 0 refills | Status: AC

## 2023-11-10 MED ORDER — ACETAMINOPHEN 325 MG PO TABS
ORAL_TABLET | ORAL | Status: AC
  Filled 2023-11-10: qty 3

## 2023-11-10 MED ORDER — ACETAMINOPHEN 325 MG PO TABS
975.0000 mg | ORAL_TABLET | Freq: Once | ORAL | Status: AC
  Administered 2023-11-10: 975 mg via ORAL
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
## 2023-12-04 ENCOUNTER — Other Ambulatory Visit: Payer: Self-pay | Admitting: Family Medicine

## 2023-12-04 DIAGNOSIS — F418 Other specified anxiety disorders: Secondary | ICD-10-CM

## 2024-01-01 ENCOUNTER — Other Ambulatory Visit: Payer: Self-pay | Admitting: Family Medicine

## 2024-01-01 DIAGNOSIS — F418 Other specified anxiety disorders: Secondary | ICD-10-CM

## 2024-01-02 ENCOUNTER — Other Ambulatory Visit: Payer: Self-pay | Admitting: Family Medicine

## 2024-01-02 DIAGNOSIS — F418 Other specified anxiety disorders: Secondary | ICD-10-CM

## 2024-01-18 ENCOUNTER — Encounter: Payer: Self-pay | Admitting: Neurology

## 2024-01-29 ENCOUNTER — Other Ambulatory Visit: Payer: Self-pay | Admitting: Family Medicine

## 2024-01-29 DIAGNOSIS — F418 Other specified anxiety disorders: Secondary | ICD-10-CM

## 2024-02-14 ENCOUNTER — Encounter (HOSPITAL_COMMUNITY): Payer: Self-pay | Admitting: Emergency Medicine

## 2024-02-14 ENCOUNTER — Ambulatory Visit (HOSPITAL_COMMUNITY)
Admission: EM | Admit: 2024-02-14 | Discharge: 2024-02-14 | Disposition: A | Discharge status: Home / Self Care | Attending: Family Medicine | Admitting: Family Medicine

## 2024-02-14 DIAGNOSIS — G43C1 Periodic headache syndromes in child or adult, intractable: Secondary | ICD-10-CM | POA: Diagnosis not present

## 2024-02-14 MED ORDER — ONDANSETRON 4 MG PO TBDP
ORAL_TABLET | ORAL | Status: AC
  Filled 2024-02-14: qty 1

## 2024-02-14 MED ORDER — KETOROLAC TROMETHAMINE 30 MG/ML IJ SOLN
INTRAMUSCULAR | Status: AC
  Filled 2024-02-14: qty 1

## 2024-02-14 MED ORDER — ONDANSETRON 4 MG PO TBDP
4.0000 mg | ORAL_TABLET | Freq: Once | ORAL | Status: AC
  Administered 2024-02-14: 4 mg via ORAL

## 2024-02-14 MED ORDER — KETOROLAC TROMETHAMINE 30 MG/ML IJ SOLN
30.0000 mg | Freq: Once | INTRAMUSCULAR | Status: AC
  Administered 2024-02-14: 30 mg via INTRAMUSCULAR

## 2024-02-14 MED ORDER — SUMATRIPTAN SUCCINATE 6 MG/0.5ML ~~LOC~~ SOLN
SUBCUTANEOUS | Status: AC
  Filled 2024-02-14: qty 0.5

## 2024-02-14 MED ORDER — SUMATRIPTAN SUCCINATE 6 MG/0.5ML ~~LOC~~ SOLN
6.0000 mg | Freq: Once | SUBCUTANEOUS | Status: AC
Start: 2024-02-14 — End: 2024-02-14
  Administered 2024-02-14: 6 mg via SUBCUTANEOUS

## 2024-02-14 NOTE — ED Triage Notes (Signed)
 Pt c/o constant generalized headache for 3 days. Reports went to HR one day at work and BP was high. Pt reports had blurred vision which normally doesn't have. Tried Goody powder that helps a little. Hx migraines.

## 2024-02-14 NOTE — Discharge Instructions (Signed)
 You have been given a shot of Toradol  30 mg and Imitrex /sumatriptan  6 mg today.  Keep your follow-up with your neurologist

## 2024-02-14 NOTE — ED Provider Notes (Signed)
 [facility]    CSN: [id number] Arrival date & time: 02/14/24  9182      History   Chief Complaint Chief Complaint  Patient presents with   Headache    HPI [name] is a 58 y.o. female.    Headache  Here for frontal headache has been going on for about 3 days.  It is throbbing and she has photophobia.  She has also had some blurred vision intermittently but none right now.  She has had some nausea and did throw up once.  No fever or cough or congestion.  Goody powders helped a little bit   she does have a history of migraines that are very similar.  She has not found Maxalt  or Emgality  to be helpful.  She does see a neurologist and will see them again in October of this year.    She is allergic to codeine.  She has had a hysterectomy Last EGFR in April of this year was 63 Past Medical History:  Diagnosis Date   Hypertension    Migraines    Prediabetes     Patient Active Problem List   Diagnosis Date Noted   Sleep disturbance 05/30/2023   Nonintractable episodic headache 01/07/2023   Prediabetes 07/06/2022   Anxiety and depression 03/29/2022   History of laparoscopic appendectomy 02/19/2021   Elevated serum hCG 02/09/2021   Situational anxiety 12/21/2019   Migraines 11/20/2019   Hypertension 11/20/2019   Cough 11/20/2019   Tachycardia 11/20/2019    Past Surgical History:  Procedure Laterality Date   ABDOMINAL HYSTERECTOMY     APPENDECTOMY     CHOLECYSTECTOMY     CYST EXCISION     LAPAROSCOPIC APPENDECTOMY N/A 02/19/2021   Procedure: APPENDECTOMY LAPAROSCOPIC;  Surgeon: Belinda Cough, MD;  Location: [facility] OR;  Service: General;  Laterality: N/A;   LAPAROSCOPY  02/19/2021   Procedure: LAPAROSCOPY DIAGNOSTIC;  Surgeon: Belinda Cough, MD;  Location: [facility] OR;  Service: General;;   LYSIS OF ADHESION  02/19/2021   Procedure: LYSIS OF ADHESION;  Surgeon: Belinda Cough, MD;  Location: [facility] OR;  Service: General;;    OB History   No obstetric  history on file.      Home Medications    Prior to Admission medications   Medication Sig Start Date End Date Taking? Authorizing Provider  atenolol  (TENORMIN ) 100 MG tablet Take 1 tablet (100 mg total) by mouth daily. 07/04/23   Zarwolo, Gloria, FNP  atenolol  (TENORMIN ) 100 MG tablet Take 1 tablet (100 mg total) by mouth daily. 10/06/23   Zarwolo, Gloria, FNP  cyclobenzaprine  (FLEXERIL ) 10 MG tablet Take 1 tablet by mouth 3 times daily as needed for muscle spasm. Warning: May cause drowsiness. 11/10/23   Rolinda Rogue, MD  hydrOXYzine  (VISTARIL ) 25 MG capsule TAKE 1 CAPSULE BY MOUTH AT BEDTIME AS NEEDED 01/30/24   Zarwolo, Gloria, FNP  olmesartan  (BENICAR ) 40 MG tablet Take 1 tablet (40 mg total) by mouth daily. 10/18/23   Zarwolo, Gloria, FNP  Vitamin D , Ergocalciferol , (DRISDOL ) 1.25 MG (50000 UNIT) CAPS capsule Take 1 capsule (50,000 Units total) by mouth every 7 (seven) days. 10/18/23   Zarwolo, Gloria, FNP    Family History Family History  Problem Relation Age of Onset   Breast cancer Mother    Hypertension Mother    Hypertension Father    Breast cancer Maternal Aunt    Breast cancer Cousin    Colon cancer Neg Hx    Colon polyps Neg Hx  Esophageal cancer Neg Hx    Stomach cancer Neg Hx    Rectal cancer Neg Hx     Social History Social History   Tobacco Use   Smoking status: Never   Smokeless tobacco: Never  Vaping Use   Vaping status: Never Used  Substance Use Topics   Alcohol  use: No   Drug use: No     Allergies   Codeine   Review of Systems Review of Systems  Neurological:  Positive for headaches.     Physical Exam Triage Vital Signs ED Triage Vitals  Encounter Vitals Group     BP 02/14/24 0952 133/80     Girls Systolic BP Percentile --      Girls Diastolic BP Percentile --      Boys Systolic BP Percentile --      Boys Diastolic BP Percentile --      Pulse Rate 02/14/24 0952 88     Resp 02/14/24 0952 16     Temp 02/14/24 0952 98.2 F (36.8 C)      Temp Source 02/14/24 0952 Oral     SpO2 02/14/24 0952 99 %     Weight --      Height --      Head Circumference --      Peak Flow --      Pain Score 02/14/24 0951 8     Pain Loc --      Pain Education --      Exclude from Growth Chart --    No data found.  Updated Vital Signs BP 133/80 (BP Location: Left Arm)   Pulse 88   Temp 98.2 F (36.8 C) (Oral)   Resp 16   SpO2 99%   Visual Acuity Right Eye Distance:   Left Eye Distance:   Bilateral Distance:    Right Eye Near:   Left Eye Near:    Bilateral Near:     Physical Exam Vitals reviewed.  Constitutional:      General: She is not in acute distress.    Appearance: She is not ill-appearing, toxic-appearing or diaphoretic.  HENT:     Mouth/Throat:     Mouth: Mucous membranes are moist.  Eyes:     Extraocular Movements: Extraocular movements intact.     Conjunctiva/sclera: Conjunctivae normal.     Pupils: Pupils are equal, round, and reactive to light.  Cardiovascular:     Rate and Rhythm: Normal rate and regular rhythm.     Heart sounds: No murmur heard. Pulmonary:     Effort: Pulmonary effort is normal. No respiratory distress.     Breath sounds: No stridor. No wheezing, rhonchi or rales.  Musculoskeletal:     Cervical back: Neck supple.  Lymphadenopathy:     Cervical: No cervical adenopathy.  Skin:    Coloration: Skin is not jaundiced or pale.  Neurological:     General: No focal deficit present.     Mental Status: She is alert and oriented to person, place, and time.     Cranial Nerves: No cranial nerve deficit.     Sensory: No sensory deficit.     Motor: No weakness.     Coordination: Coordination normal.     Gait: Gait normal.     Deep Tendon Reflexes: Reflexes normal.  Psychiatric:        Behavior: Behavior normal.      UC Treatments / Results  Labs (all labs ordered are listed, but only abnormal results are displayed) Labs Reviewed -  No data to display  EKG   Radiology No results  found.  Procedures Procedures (including critical care time)  Medications Ordered in UC Medications  ketorolac  (TORADOL ) 30 MG/ML injection 30 mg (has no administration in time range)  SUMAtriptan  (IMITREX ) injection 6 mg (has no administration in time range)  ondansetron  (ZOFRAN -ODT) disintegrating tablet 4 mg (has no administration in time range)    Initial Impression / Assessment and Plan / UC Course  I have reviewed the triage vital signs and the nursing notes.  Pertinent labs & imaging results that were available during my care of the patient were reviewed by me and considered in my medical decision making (see chart for details).     This seems to be consistent, overall, with her usual migraines.  Toradol  and Imitrex  injections are given here along with Zofran  ODT.  She will keep her follow-up with her neurologist.  Work note is provided. Final Clinical Impressions(s) / UC Diagnoses   Final diagnoses:  Intractable periodic headache syndrome     Discharge Instructions      You have been given a shot of Toradol  30 mg and Imitrex /sumatriptan  6 mg today.  Keep your follow-up with your neurologist      ED Prescriptions   None    PDMP not reviewed this encounter.   Vonna Sharlet POUR, MD 02/14/24 [phone]

## 2024-03-01 ENCOUNTER — Ambulatory Visit: Admitting: Family Medicine

## 2024-03-01 ENCOUNTER — Encounter: Payer: Self-pay | Admitting: Family Medicine

## 2024-03-01 VITALS — BP 144/79 | HR 80 | Resp 16 | Ht 66.0 in | Wt 140.0 lb

## 2024-03-01 DIAGNOSIS — R7303 Prediabetes: Secondary | ICD-10-CM

## 2024-03-01 DIAGNOSIS — R7301 Impaired fasting glucose: Secondary | ICD-10-CM

## 2024-03-01 DIAGNOSIS — E038 Other specified hypothyroidism: Secondary | ICD-10-CM

## 2024-03-01 DIAGNOSIS — I10 Essential (primary) hypertension: Secondary | ICD-10-CM | POA: Diagnosis not present

## 2024-03-01 DIAGNOSIS — G479 Sleep disorder, unspecified: Secondary | ICD-10-CM | POA: Diagnosis not present

## 2024-03-01 DIAGNOSIS — Z23 Encounter for immunization: Secondary | ICD-10-CM

## 2024-03-01 DIAGNOSIS — E559 Vitamin D deficiency, unspecified: Secondary | ICD-10-CM

## 2024-03-01 DIAGNOSIS — E7849 Other hyperlipidemia: Secondary | ICD-10-CM

## 2024-03-01 MED ORDER — HYDROXYZINE PAMOATE 50 MG PO CAPS: 50.0000 mg | ORAL_CAPSULE | Freq: Every evening | ORAL | 0 refills | Status: DC | PRN

## 2024-03-01 NOTE — Assessment & Plan Note (Addendum)
 She takes olmesartan  40 mg daily Patient is asymptomatic Low-sodium diet with increased physical activity encourage

## 2024-03-01 NOTE — Assessment & Plan Note (Addendum)
 A prescription for Hydroxyzine  50 mg has been sent to your pharmacy. Take 30 minutes to 1 hour before bedtime to help with sleep. Monitor for excessive drowsiness   For managing sleep disturbance non-pharmacologically, consider the following sleep hygiene practices: Establish a Bedtime Routine: Create a consistent routine before bed to signal to your body that it is time to wind down. Maintain a Regular Sleep-Wake Schedule: Go to bed and wake up at the same time every day, even on weekends. Avoid Electronics Before Bed: Limit the use of computers and other electronic devices at least one hour before bedtime to reduce blue light exposure. Limit Time in Bed: If unable to fall asleep within 15 minutes, get out of bed and engage in a relaxing activity before trying again. Manage Daily Stress: Incorporate stress-reducing activities into your daily routine to alleviate anxiety and tension. Avoid Late Exercise: Refrain from vigorous exercise close to bedtime, as it can interfere with sleep. Use Bed for Sleep and Intimacy Only: Reserve the bed for sleep and sexual activity to strengthen the association between your bed and rest. Remove Electronics from [employer] Area: Consider keeping electronic devices away from the bedroom to minimize distractions.   Please follow up if your symptoms worsen or fail to improve.

## 2024-03-01 NOTE — Assessment & Plan Note (Signed)
 Lifestyle modifications for prediabetes were discussed, including adopting a heart-healthy diet and increasing physical activity. The patient was encouraged to decrease her intake of high-sugar foods and beverages. She verbalized understanding and is aware of the plan of care.

## 2024-03-01 NOTE — Assessment & Plan Note (Signed)
 Patient educated on CDC recommendation for the vaccine. Verbal consent was obtained from the patient, vaccine administered by nurse, no sign of adverse reactions noted at this time. Patient education on arm soreness and use of tylenol or ibuprofen for this patient  was discussed. Patient educated on the signs and symptoms of adverse effect and advise to contact the office if they occur.

## 2024-03-01 NOTE — Patient Instructions (Signed)
 I appreciate the opportunity to provide care to you today!    Follow up:  6 weeks for pap smear  Labs: please stop by the lab today to get your blood drawn (CBC, CMP, TSH, Lipid profile, HgA1c, Vit D)   A prescription for Hydroxyzine  50 mg has been sent to your pharmacy. Take 30 minutes to 1 hour before bedtime to help with sleep. Monitor for excessive drowsiness  Insomnia For managing insomnia non-pharmacologically, consider the following sleep hygiene practices: Establish a Bedtime Routine: Create a consistent routine before bed to signal to your body that it is time to wind down. Maintain a Regular Sleep-Wake Schedule: Go to bed and wake up at the same time every day, even on weekends. Avoid Electronics Before Bed: Limit the use of computers and other electronic devices at least one hour before bedtime to reduce blue light exposure. Limit Time in Bed: If unable to fall asleep within 15 minutes, get out of bed and engage in a relaxing activity before trying again. Manage Daily Stress: Incorporate stress-reducing activities into your daily routine to alleviate anxiety and tension. Avoid Late Exercise: Refrain from vigorous exercise close to bedtime, as it can interfere with sleep. Use Bed for Sleep and Intimacy Only: Reserve the bed for sleep and sexual activity to strengthen the association between your bed and rest. Remove Electronics from [employer] Area: Consider keeping electronic devices away from the bedroom to minimize distractions.   Please follow up if your symptoms worsen or fail to improve.    Please continue to a heart-healthy diet and increase your physical activities. Try to exercise for at least five days a week.    It was a pleasure to see you and I look forward to continuing to work together on your health and well-being. Please do not hesitate to call the office if you need care or have questions about your care.  In case of emergency, please visit the  Emergency Department for urgent care, or contact our clinic at [phone] to schedule an appointment. We're here to help you!   Have a wonderful day and week. With Gratitude, Jakyrie Totherow MSN, FNP-BC

## 2024-03-01 NOTE — Progress Notes (Signed)
 Established Patient Office Visit  Subjective:  Patient ID: [name], female    DOB: 1966/01/03  Age: 58 y.o. MRN: [id number]  CC:  Chief Complaint  Patient presents with   Insomnia    Still has trouble with both falling asleep and staying asleep     HPI [name] is a 58 y.o. female with past medical history of hypertension, prediabetes, vit d def presents for f/u of  chronic medical conditions. For the details of today's visit, please refer to the assessment and plan.     Past Medical History:  Diagnosis Date   Hypertension    Migraines    Prediabetes     Past Surgical History:  Procedure Laterality Date   ABDOMINAL HYSTERECTOMY     APPENDECTOMY     CHOLECYSTECTOMY     CYST EXCISION     LAPAROSCOPIC APPENDECTOMY N/A 02/19/2021   Procedure: APPENDECTOMY LAPAROSCOPIC;  Surgeon: Belinda Cough, MD;  Location: [facility] OR;  Service: General;  Laterality: N/A;   LAPAROSCOPY  02/19/2021   Procedure: LAPAROSCOPY DIAGNOSTIC;  Surgeon: Belinda Cough, MD;  Location: [facility] OR;  Service: General;;   LYSIS OF ADHESION  02/19/2021   Procedure: LYSIS OF ADHESION;  Surgeon: Belinda Cough, MD;  Location: [facility] OR;  Service: General;;    Family History  Problem Relation Age of Onset   Breast cancer Mother    Hypertension Mother    Hypertension Father    Breast cancer Maternal Aunt    Breast cancer Cousin    Colon cancer Neg Hx    Colon polyps Neg Hx    Esophageal cancer Neg Hx    Stomach cancer Neg Hx    Rectal cancer Neg Hx     Social History   Socioeconomic History   Marital status: Legally Separated    Spouse name: Not on file   Number of children: Not on file   Years of education: Not on file   Highest education level: 12th grade  Occupational History   Not on file  Tobacco Use   Smoking status: Never   Smokeless tobacco: Never  Vaping Use   Vaping status: Never Used  Substance and Sexual Activity   Alcohol  use: No   Drug use: No   Sexual activity:  Not on file  Other Topics Concern   Not on file  Social History Narrative   Are you right handed or left handed? left   Are you currently employed ? Y   What is your current occupation? [employer]   Do you live at home alone? no   Who lives with you?    What type of home do you live in: 1 story or 2 story? 2       Social Drivers of Corporate investment banker Strain: Low Risk  (02/29/2024)   Overall Financial Resource Strain (CARDIA)    Difficulty of Paying Living Expenses: Not very hard  Food Insecurity: No Food Insecurity (02/29/2024)   Hunger Vital Sign    Worried About Running Out of Food in the Last Year: Never true    Ran Out of Food in the Last Year: Never true  Transportation Needs: No Transportation Needs (02/29/2024)   PRAPARE - Administrator, Civil Service (Medical): No    Lack of Transportation (Non-Medical): No  Physical Activity: Inactive (02/29/2024)   Exercise Vital Sign    Days of Exercise per Week: 0 days    Minutes of Exercise per Session:  Not on file  Stress: Stress Concern Present (02/29/2024)   [employer] of Occupational Health - Occupational Stress Questionnaire    Feeling of Stress: Very much  Social Connections: Unknown (02/29/2024)   Social Connection and Isolation Panel    Frequency of Communication with Friends and Family: Twice a week    Frequency of Social Gatherings with Friends and Family: Once a week    Attends Religious Services: 1 to 4 times per year    Active Member of [employer] or Organizations: Patient declined    Attends Banker Meetings: Not on file    Marital Status: Separated  Intimate Partner Violence: Unknown (09/24/2021)   Received from [facility]   HITS    Physically Hurt: Not on file    Insult or Talk Down To: Not on file    Threaten Physical Harm: Not on file    Scream or Curse: Not on file    Outpatient Medications Prior to Visit  Medication Sig Dispense Refill   atenolol  (TENORMIN ) 100 MG  tablet Take 1 tablet (100 mg total) by mouth daily. 90 tablet 0   olmesartan  (BENICAR ) 40 MG tablet Take 1 tablet (40 mg total) by mouth daily. 90 tablet 1   Vitamin D , Ergocalciferol , (DRISDOL ) 1.25 MG (50000 UNIT) CAPS capsule Take 1 capsule (50,000 Units total) by mouth every 7 (seven) days. 30 capsule 3   hydrOXYzine  (VISTARIL ) 25 MG capsule TAKE 1 CAPSULE BY MOUTH AT BEDTIME AS NEEDED 30 capsule 0   cyclobenzaprine  (FLEXERIL ) 10 MG tablet Take 1 tablet by mouth 3 times daily as needed for muscle spasm. Warning: May cause drowsiness. (Patient not taking: Reported on 03/01/2024) 21 tablet 0   atenolol  (TENORMIN ) 100 MG tablet Take 1 tablet (100 mg total) by mouth daily. 90 tablet 0   No facility-administered medications prior to visit.    Allergies  Allergen Reactions   Codeine Itching    ROS Review of Systems  Constitutional:  Negative for chills and fever.  Eyes:  Negative for visual disturbance.  Respiratory:  Negative for chest tightness and shortness of breath.   Neurological:  Negative for dizziness and headaches.      Objective:    Physical Exam HENT:     Head: Normocephalic.     Mouth/Throat:     Mouth: Mucous membranes are moist.  Cardiovascular:     Rate and Rhythm: Normal rate.     Heart sounds: Normal heart sounds.  Pulmonary:     Effort: Pulmonary effort is normal.     Breath sounds: Normal breath sounds.  Neurological:     Mental Status: She is alert.     BP (!) 144/79   Pulse 80   Resp 16   Ht 5' 6 (1.676 m)   Wt 140 lb (63.5 kg)   SpO2 98%   BMI 22.60 kg/m  Wt Readings from Last 3 Encounters:  03/01/24 140 lb (63.5 kg)  09/30/23 131 lb 1.9 oz (59.5 kg)  09/12/23 131 lb (59.4 kg)    Lab Results  Component Value Date   TSH 0.692 09/30/2023   Lab Results  Component Value Date   WBC 6.0 09/30/2023   HGB 11.9 09/30/2023   HCT 37.3 09/30/2023   MCV 95 09/30/2023   PLT 342 09/30/2023   Lab Results  Component Value Date   NA 144  09/30/2023   K 4.4 09/30/2023   CO2 21 09/30/2023   GLUCOSE 85 09/30/2023   BUN 13 09/30/2023  CREATININE 0.80 09/30/2023   BILITOT 0.3 09/30/2023   ALKPHOS 61 09/30/2023   AST 14 09/30/2023   ALT 11 09/30/2023   PROT 6.9 09/30/2023   ALBUMIN 4.2 09/30/2023   CALCIUM 9.4 09/30/2023   ANIONGAP 8 12/22/2022   EGFR 86 09/30/2023   Lab Results  Component Value Date   CHOL 213 (H) 09/30/2023   Lab Results  Component Value Date   HDL 65 09/30/2023   Lab Results  Component Value Date   LDLCALC 133 (H) 09/30/2023   Lab Results  Component Value Date   TRIG 87 09/30/2023   Lab Results  Component Value Date   CHOLHDL 3.3 09/30/2023   Lab Results  Component Value Date   HGBA1C 6.0 (H) 09/30/2023      Assessment & Plan:  Primary hypertension Assessment & Plan: She takes olmesartan  40 mg daily Patient is asymptomatic Low-sodium diet with increased physical activity encourage    Prediabetes Assessment & Plan: Lifestyle modifications for prediabetes were discussed, including adopting a heart-healthy diet and increasing physical activity. The patient was encouraged to decrease her intake of high-sugar foods and beverages. She verbalized understanding and is aware of the plan of care.    Sleep disturbance Assessment & Plan: A prescription for Hydroxyzine  50 mg has been sent to your pharmacy. Take 30 minutes to 1 hour before bedtime to help with sleep. Monitor for excessive drowsiness   For managing sleep disturbance non-pharmacologically, consider the following sleep hygiene practices: Establish a Bedtime Routine: Create a consistent routine before bed to signal to your body that it is time to wind down. Maintain a Regular Sleep-Wake Schedule: Go to bed and wake up at the same time every day, even on weekends. Avoid Electronics Before Bed: Limit the use of computers and other electronic devices at least one hour before bedtime to reduce blue light exposure. Limit  Time in Bed: If unable to fall asleep within 15 minutes, get out of bed and engage in a relaxing activity before trying again. Manage Daily Stress: Incorporate stress-reducing activities into your daily routine to alleviate anxiety and tension. Avoid Late Exercise: Refrain from vigorous exercise close to bedtime, as it can interfere with sleep. Use Bed for Sleep and Intimacy Only: Reserve the bed for sleep and sexual activity to strengthen the association between your bed and rest. Remove Electronics from [employer] Area: Consider keeping electronic devices away from the bedroom to minimize distractions.   Please follow up if your symptoms worsen or fail to improve.  Orders: -     hydrOXYzine  Pamoate; Take 1 capsule (50 mg total) by mouth at bedtime as needed.  Dispense: 30 capsule; Refill: 0  Encounter for immunization Assessment & Plan: Patient educated on CDC recommendation for the vaccine. Verbal consent was obtained from the patient, vaccine administered by nurse, no sign of adverse reactions noted at this time. Patient education on arm soreness and use of tylenol  or ibuprofen  for this patient  was discussed. Patient educated on the signs and symptoms of adverse effect and advise to contact the office if they occur.   Orders: -     Flu vaccine trivalent PF, 6mos and older(Flulaval,Afluria,Fluarix,Fluzone) -     Pneumococcal conjugate vaccine 20-valent  IFG (impaired fasting glucose) -     Hemoglobin A1c  Vitamin D  deficiency -     VITAMIN D  25 Hydroxy (Vit-D Deficiency, Fractures)  TSH (thyroid-stimulating hormone deficiency) -     TSH + free T4  Other hyperlipidemia -  Lipid panel -     CMP14+EGFR -     CBC with Differential/Platelet  Note: This chart has been completed using Engineer, civil (consulting) software, and while attempts have been made to ensure accuracy, certain words and phrases may not be transcribed as intended.    Follow-up: Return in about 6 weeks (around  04/12/2024).   Elide Stalzer, FNP

## 2024-03-02 ENCOUNTER — Other Ambulatory Visit: Payer: Self-pay | Admitting: Family Medicine

## 2024-03-02 ENCOUNTER — Ambulatory Visit: Payer: Self-pay | Admitting: Family Medicine

## 2024-03-02 DIAGNOSIS — F418 Other specified anxiety disorders: Secondary | ICD-10-CM

## 2024-03-02 LAB — VITAMIN D 25 HYDROXY (VIT D DEFICIENCY, FRACTURES): Vit D, 25-Hydroxy: 34.1 ng/mL (ref 30.0–100.0)

## 2024-03-02 LAB — CMP14+EGFR
ALT: 12 IU/L (ref 0–32)
AST: 13 IU/L (ref 0–40)
Albumin: 4.4 g/dL (ref 3.8–4.9)
Alkaline Phosphatase: 69 IU/L (ref 44–121)
BUN/Creatinine Ratio: 15 (ref 9–23)
BUN: 14 mg/dL (ref 6–24)
Bilirubin Total: 0.2 mg/dL (ref 0.0–1.2)
CO2: 25 mmol/L (ref 20–29)
Calcium: 9.8 mg/dL (ref 8.7–10.2)
Chloride: 106 mmol/L (ref 96–106)
Creatinine, Ser: 0.92 mg/dL (ref 0.57–1.00)
Globulin, Total: 3 g/dL (ref 1.5–4.5)
Glucose: 94 mg/dL (ref 70–99)
Potassium: 5.3 mmol/L — ABNORMAL HIGH (ref 3.5–5.2)
Sodium: 143 mmol/L (ref 134–144)
Total Protein: 7.4 g/dL (ref 6.0–8.5)
eGFR: 73 mL/min/1.73 (ref >59)

## 2024-03-02 LAB — CBC WITH DIFFERENTIAL/PLATELET
Basophils Absolute: 0.1 x10E3/uL (ref 0.0–0.2)
Basos: 2 %
EOS (ABSOLUTE): 0.2 x10E3/uL (ref 0.0–0.4)
Eos: 3 %
Hematocrit: 41.2 % (ref 34.0–46.6)
Hemoglobin: 12.5 g/dL (ref 11.1–15.9)
Immature Grans (Abs): 0 x10E3/uL (ref 0.0–0.1)
Immature Granulocytes: 0 %
Lymphocytes Absolute: 1.8 x10E3/uL (ref 0.7–3.1)
Lymphs: 29 %
MCH: 28.4 pg (ref 26.6–33.0)
MCHC: 30.3 g/dL — ABNORMAL LOW (ref 31.5–35.7)
MCV: 94 fL (ref 79–97)
Monocytes Absolute: 0.7 x10E3/uL (ref 0.1–0.9)
Monocytes: 12 %
Neutrophils Absolute: 3.4 x10E3/uL (ref 1.4–7.0)
Neutrophils: 54 %
Platelets: 380 x10E3/uL (ref 150–450)
RBC: 4.4 x10E6/uL (ref 3.77–5.28)
RDW: 13.6 % (ref 11.7–15.4)
WBC: 6.2 x10E3/uL (ref 3.4–10.8)

## 2024-03-02 LAB — LIPID PANEL
Chol/HDL Ratio: 3.7 ratio (ref 0.0–4.4)
Cholesterol, Total: 224 mg/dL — ABNORMAL HIGH (ref 100–199)
HDL: 61 mg/dL (ref >39)
LDL Chol Calc (NIH): 145 mg/dL — ABNORMAL HIGH (ref 0–99)
Triglycerides: 104 mg/dL (ref 0–149)
VLDL Cholesterol Cal: 18 mg/dL (ref 5–40)

## 2024-03-02 LAB — TSH+FREE T4
Free T4: 1.14 ng/dL (ref 0.82–1.77)
TSH: 0.738 u[IU]/mL (ref 0.450–4.500)

## 2024-03-02 LAB — HEMOGLOBIN A1C
Est. average glucose Bld gHb Est-mCnc: 128 mg/dL
Hgb A1c MFr Bld: 6.1 % — ABNORMAL HIGH (ref 4.8–5.6)

## 2024-03-20 ENCOUNTER — Ambulatory Visit: Admitting: Neurology

## 2024-03-28 ENCOUNTER — Ambulatory Visit: Admitting: Neurology

## 2024-03-28 ENCOUNTER — Other Ambulatory Visit: Payer: Self-pay | Admitting: Family Medicine

## 2024-03-28 DIAGNOSIS — F418 Other specified anxiety disorders: Secondary | ICD-10-CM

## 2024-03-29 ENCOUNTER — Other Ambulatory Visit: Payer: Self-pay | Admitting: Family Medicine

## 2024-03-29 DIAGNOSIS — G479 Sleep disorder, unspecified: Secondary | ICD-10-CM

## 2024-04-12 ENCOUNTER — Other Ambulatory Visit: Payer: Self-pay | Admitting: Neurology

## 2024-04-28 ENCOUNTER — Other Ambulatory Visit: Payer: Self-pay | Admitting: Family Medicine

## 2024-04-28 DIAGNOSIS — G479 Sleep disorder, unspecified: Secondary | ICD-10-CM

## 2024-05-23 ENCOUNTER — Other Ambulatory Visit (HOSPITAL_COMMUNITY): Payer: Self-pay | Admitting: Family Medicine

## 2024-05-23 ENCOUNTER — Encounter (HOSPITAL_COMMUNITY): Payer: Self-pay | Admitting: Family Medicine

## 2024-05-23 DIAGNOSIS — N6489 Other specified disorders of breast: Secondary | ICD-10-CM

## 2024-05-28 ENCOUNTER — Encounter (HOSPITAL_COMMUNITY)

## 2024-05-28 ENCOUNTER — Other Ambulatory Visit: Payer: Self-pay | Admitting: Family Medicine

## 2024-05-28 DIAGNOSIS — Z1231 Encounter for screening mammogram for malignant neoplasm of breast: Secondary | ICD-10-CM

## 2024-05-28 DIAGNOSIS — G479 Sleep disorder, unspecified: Secondary | ICD-10-CM

## 2024-06-19 ENCOUNTER — Encounter (HOSPITAL_COMMUNITY): Payer: Self-pay

## 2024-06-19 ENCOUNTER — Ambulatory Visit (HOSPITAL_COMMUNITY)
Admission: RE | Admit: 2024-06-19 | Discharge: 2024-06-19 | Disposition: A | Discharge status: Home / Self Care | Source: Ambulatory Visit | Attending: Family Medicine | Admitting: Family Medicine

## 2024-06-19 DIAGNOSIS — N6489 Other specified disorders of breast: Secondary | ICD-10-CM

## 2024-06-24 ENCOUNTER — Other Ambulatory Visit: Payer: Self-pay | Admitting: Family Medicine

## 2024-06-24 DIAGNOSIS — G479 Sleep disorder, unspecified: Secondary | ICD-10-CM

## 2024-06-24 DIAGNOSIS — F418 Other specified anxiety disorders: Secondary | ICD-10-CM

## 2024-06-25 ENCOUNTER — Other Ambulatory Visit: Payer: Self-pay | Admitting: Family Medicine

## 2024-06-25 DIAGNOSIS — I10 Essential (primary) hypertension: Secondary | ICD-10-CM

## 2024-07-05 ENCOUNTER — Other Ambulatory Visit: Payer: Self-pay | Admitting: Family Medicine

## 2024-07-05 DIAGNOSIS — I10 Essential (primary) hypertension: Secondary | ICD-10-CM

## 2024-07-24 ENCOUNTER — Other Ambulatory Visit: Payer: Self-pay | Admitting: Family Medicine

## 2024-07-24 DIAGNOSIS — G479 Sleep disorder, unspecified: Secondary | ICD-10-CM
